# Patient Record
Sex: Female | Born: 1935 | Race: Black or African American | Hispanic: No | Smoking: Never smoker
Health system: Southern US, Community
[De-identification: ages and names within clinical notes are randomized; demographics above are authoritative.]

## PROBLEM LIST (undated history)

## (undated) DIAGNOSIS — K5790 Diverticulosis of intestine, part unspecified, without perforation or abscess without bleeding: Secondary | ICD-10-CM

## (undated) DIAGNOSIS — I2510 Atherosclerotic heart disease of native coronary artery without angina pectoris: Secondary | ICD-10-CM

## (undated) DIAGNOSIS — I509 Heart failure, unspecified: Secondary | ICD-10-CM

## (undated) DIAGNOSIS — K922 Gastrointestinal hemorrhage, unspecified: Secondary | ICD-10-CM

## (undated) DIAGNOSIS — K449 Diaphragmatic hernia without obstruction or gangrene: Secondary | ICD-10-CM

## (undated) DIAGNOSIS — M1990 Unspecified osteoarthritis, unspecified site: Secondary | ICD-10-CM

## (undated) DIAGNOSIS — D649 Anemia, unspecified: Secondary | ICD-10-CM

## (undated) DIAGNOSIS — E785 Hyperlipidemia, unspecified: Secondary | ICD-10-CM

## (undated) DIAGNOSIS — M8080XA Other osteoporosis with current pathological fracture, unspecified site, initial encounter for fracture: Secondary | ICD-10-CM

## (undated) DIAGNOSIS — I10 Essential (primary) hypertension: Secondary | ICD-10-CM

## (undated) HISTORY — DX: Anemia, unspecified: D64.9

## (undated) HISTORY — DX: Other osteoporosis with current pathological fracture, unspecified site, initial encounter for fracture: M80.80XA

## (undated) HISTORY — DX: Gastrointestinal hemorrhage, unspecified: K92.2

## (undated) HISTORY — DX: Atherosclerotic heart disease of native coronary artery without angina pectoris: I25.10

## (undated) HISTORY — PX: TOTAL ABDOMINAL HYSTERECTOMY: SHX209

## (undated) HISTORY — DX: Essential (primary) hypertension: I10

## (undated) HISTORY — DX: Hyperlipidemia, unspecified: E78.5

## (undated) HISTORY — DX: Unspecified osteoarthritis, unspecified site: M19.90

## (undated) HISTORY — PX: FIXATION KYPHOPLASTY: SHX860

## (undated) HISTORY — DX: Diaphragmatic hernia without obstruction or gangrene: K44.9

## (undated) HISTORY — DX: Diverticulosis of intestine, part unspecified, without perforation or abscess without bleeding: K57.90

## (undated) HISTORY — DX: Heart failure, unspecified: I50.9

## (undated) HISTORY — DX: Hypercalcemia: E83.52

---

## 1997-08-30 ENCOUNTER — Other Ambulatory Visit: Admission: RE | Admit: 1997-08-30 | Discharge: 1997-08-30 | Payer: Self-pay | Admitting: Obstetrics & Gynecology

## 1998-07-23 ENCOUNTER — Encounter: Payer: Self-pay | Admitting: Cardiology

## 1998-07-23 ENCOUNTER — Ambulatory Visit (HOSPITAL_COMMUNITY): Admission: RE | Admit: 1998-07-23 | Discharge: 1998-07-23 | Payer: Self-pay | Admitting: Cardiology

## 1998-10-29 ENCOUNTER — Other Ambulatory Visit: Admission: RE | Admit: 1998-10-29 | Discharge: 1998-10-29 | Payer: Self-pay | Admitting: Obstetrics and Gynecology

## 1999-01-10 ENCOUNTER — Encounter: Payer: Self-pay | Admitting: Emergency Medicine

## 1999-01-10 ENCOUNTER — Emergency Department (HOSPITAL_COMMUNITY): Admission: EM | Admit: 1999-01-10 | Discharge: 1999-01-10 | Payer: Self-pay | Admitting: Emergency Medicine

## 2000-03-25 ENCOUNTER — Other Ambulatory Visit: Admission: RE | Admit: 2000-03-25 | Discharge: 2000-03-25 | Payer: Self-pay | Admitting: Obstetrics & Gynecology

## 2000-07-13 ENCOUNTER — Encounter: Payer: Self-pay | Admitting: Cardiology

## 2000-07-13 ENCOUNTER — Ambulatory Visit (HOSPITAL_COMMUNITY): Admission: RE | Admit: 2000-07-13 | Discharge: 2000-07-13 | Payer: Self-pay | Admitting: Cardiology

## 2001-03-25 ENCOUNTER — Other Ambulatory Visit: Admission: RE | Admit: 2001-03-25 | Discharge: 2001-03-25 | Payer: Self-pay | Admitting: Obstetrics and Gynecology

## 2001-08-27 ENCOUNTER — Emergency Department (HOSPITAL_COMMUNITY): Admission: EM | Admit: 2001-08-27 | Discharge: 2001-08-27 | Payer: Self-pay

## 2002-02-01 ENCOUNTER — Ambulatory Visit (HOSPITAL_COMMUNITY): Admission: RE | Admit: 2002-02-01 | Discharge: 2002-02-01 | Payer: Self-pay | Admitting: Cardiology

## 2002-02-01 ENCOUNTER — Encounter: Payer: Self-pay | Admitting: Cardiology

## 2002-05-04 ENCOUNTER — Other Ambulatory Visit: Admission: RE | Admit: 2002-05-04 | Discharge: 2002-05-04 | Payer: Self-pay | Admitting: Obstetrics and Gynecology

## 2004-03-29 ENCOUNTER — Emergency Department (HOSPITAL_COMMUNITY): Admission: EM | Admit: 2004-03-29 | Discharge: 2004-03-29 | Payer: Self-pay | Admitting: *Deleted

## 2005-02-26 ENCOUNTER — Ambulatory Visit (HOSPITAL_COMMUNITY): Admission: RE | Admit: 2005-02-26 | Discharge: 2005-02-26 | Payer: Self-pay | Admitting: Cardiology

## 2005-03-13 ENCOUNTER — Inpatient Hospital Stay (HOSPITAL_BASED_OUTPATIENT_CLINIC_OR_DEPARTMENT_OTHER): Admission: RE | Admit: 2005-03-13 | Discharge: 2005-03-13 | Payer: Self-pay | Admitting: Cardiology

## 2008-07-05 ENCOUNTER — Ambulatory Visit (HOSPITAL_COMMUNITY)
Admission: RE | Admit: 2008-07-05 | Discharge: 2008-07-05 | Payer: Self-pay | Admitting: Physical Medicine and Rehabilitation

## 2008-08-14 ENCOUNTER — Inpatient Hospital Stay (HOSPITAL_COMMUNITY): Admission: EM | Admit: 2008-08-14 | Discharge: 2008-08-23 | Payer: Self-pay | Admitting: *Deleted

## 2008-08-17 ENCOUNTER — Encounter (INDEPENDENT_AMBULATORY_CARE_PROVIDER_SITE_OTHER): Payer: Self-pay | Admitting: Interventional Radiology

## 2008-08-17 ENCOUNTER — Encounter: Payer: Self-pay | Admitting: Interventional Radiology

## 2008-08-24 ENCOUNTER — Ambulatory Visit: Payer: Self-pay | Admitting: Oncology

## 2008-09-14 ENCOUNTER — Encounter: Payer: Self-pay | Admitting: Interventional Radiology

## 2008-09-14 LAB — COMPREHENSIVE METABOLIC PANEL
Albumin: 3.5 g/dL (ref 3.5–5.2)
Alkaline Phosphatase: 90 U/L (ref 39–117)
CO2: 34 mEq/L — ABNORMAL HIGH (ref 19–32)
Calcium: 14.8 mg/dL (ref 8.4–10.5)
Chloride: 100 mEq/L (ref 96–112)
Glucose, Bld: 144 mg/dL — ABNORMAL HIGH (ref 70–99)
Potassium: 3.4 mEq/L — ABNORMAL LOW (ref 3.5–5.3)
Sodium: 140 mEq/L (ref 135–145)
Total Protein: 6.3 g/dL (ref 6.0–8.3)

## 2008-09-14 LAB — CBC WITH DIFFERENTIAL/PLATELET
BASO%: 0.1 % (ref 0.0–2.0)
Basophils Absolute: 0 10*3/uL (ref 0.0–0.1)
Eosinophils Absolute: 0.2 10*3/uL (ref 0.0–0.5)
HCT: 34.3 % — ABNORMAL LOW (ref 34.8–46.6)
HGB: 11.6 g/dL (ref 11.6–15.9)
LYMPH%: 11.6 % — ABNORMAL LOW (ref 14.0–49.7)
MONO#: 1.1 10*3/uL — ABNORMAL HIGH (ref 0.1–0.9)
NEUT#: 8.2 10*3/uL — ABNORMAL HIGH (ref 1.5–6.5)
NEUT%: 76.4 % (ref 38.4–76.8)
Platelets: 253 10*3/uL (ref 145–400)
WBC: 10.8 10*3/uL — ABNORMAL HIGH (ref 3.9–10.3)
lymph#: 1.2 10*3/uL (ref 0.9–3.3)

## 2008-09-16 ENCOUNTER — Inpatient Hospital Stay (HOSPITAL_COMMUNITY): Admission: EM | Admit: 2008-09-16 | Discharge: 2008-09-28 | Payer: Self-pay | Admitting: Emergency Medicine

## 2008-09-16 ENCOUNTER — Ambulatory Visit: Payer: Self-pay | Admitting: Hematology & Oncology

## 2008-09-18 ENCOUNTER — Ambulatory Visit: Payer: Self-pay | Admitting: Oncology

## 2008-09-19 LAB — BETA 2 MICROGLOBULIN, SERUM: Beta-2 Microglobulin: 3.39 mg/L — ABNORMAL HIGH (ref 1.01–1.73)

## 2008-09-19 LAB — KAPPA/LAMBDA LIGHT CHAINS: Kappa:Lambda Ratio: 17.11 — ABNORMAL HIGH (ref 0.26–1.65)

## 2008-09-19 LAB — IMMUNOFIXATION ELECTROPHORESIS: Total Protein, Serum Electrophoresis: 6.5 g/dL (ref 6.0–8.3)

## 2008-09-20 ENCOUNTER — Encounter (HOSPITAL_COMMUNITY): Payer: Self-pay | Admitting: Oncology

## 2008-10-03 ENCOUNTER — Ambulatory Visit: Payer: Self-pay | Admitting: Oncology

## 2008-10-05 LAB — CBC WITH DIFFERENTIAL/PLATELET
BASO%: 0.2 % (ref 0.0–2.0)
EOS%: 1.7 % (ref 0.0–7.0)
HGB: 10.7 g/dL — ABNORMAL LOW (ref 11.6–15.9)
MCH: 32 pg (ref 25.1–34.0)
MCHC: 33.7 g/dL (ref 31.5–36.0)
RDW: 16.9 % — ABNORMAL HIGH (ref 11.2–14.5)
WBC: 11.1 10*3/uL — ABNORMAL HIGH (ref 3.9–10.3)
lymph#: 1.3 10*3/uL (ref 0.9–3.3)

## 2008-10-05 LAB — COMPREHENSIVE METABOLIC PANEL
ALT: 13 U/L (ref 0–35)
AST: 14 U/L (ref 0–37)
Albumin: 3.2 g/dL — ABNORMAL LOW (ref 3.5–5.2)
Calcium: 8.6 mg/dL (ref 8.4–10.5)
Chloride: 111 mEq/L (ref 96–112)
Potassium: 4.2 mEq/L (ref 3.5–5.3)
Total Protein: 5.5 g/dL — ABNORMAL LOW (ref 6.0–8.3)

## 2008-10-10 ENCOUNTER — Encounter: Admission: RE | Admit: 2008-10-10 | Discharge: 2008-10-10 | Payer: Self-pay | Admitting: Cardiology

## 2008-10-13 LAB — CBC WITH DIFFERENTIAL/PLATELET
EOS%: 4.7 % (ref 0.0–7.0)
Eosinophils Absolute: 0.4 10*3/uL (ref 0.0–0.5)
LYMPH%: 14.6 % (ref 14.0–49.7)
MCH: 32 pg (ref 25.1–34.0)
MCV: 95.9 fL (ref 79.5–101.0)
MONO%: 13.5 % (ref 0.0–14.0)
NEUT#: 5.4 10*3/uL (ref 1.5–6.5)
Platelets: 242 10*3/uL (ref 145–400)
RBC: 3.22 10*6/uL — ABNORMAL LOW (ref 3.70–5.45)
RDW: 16.9 % — ABNORMAL HIGH (ref 11.2–14.5)

## 2008-10-20 LAB — CBC WITH DIFFERENTIAL/PLATELET
BASO%: 0.1 % (ref 0.0–2.0)
LYMPH%: 12.1 % — ABNORMAL LOW (ref 14.0–49.7)
MCHC: 34 g/dL (ref 31.5–36.0)
MCV: 92 fL (ref 79.5–101.0)
MONO#: 1 10*3/uL — ABNORMAL HIGH (ref 0.1–0.9)
MONO%: 10.7 % (ref 0.0–14.0)
Platelets: 257 10*3/uL (ref 145–400)
RBC: 3.39 10*6/uL — ABNORMAL LOW (ref 3.70–5.45)
RDW: 16.1 % — ABNORMAL HIGH (ref 11.2–14.5)
WBC: 9.6 10*3/uL (ref 3.9–10.3)

## 2008-10-20 LAB — PROTEIN / CREATININE RATIO, URINE: Protein Creatinine Ratio: 1.82 — ABNORMAL HIGH (ref <0.15)

## 2008-10-23 LAB — COMPREHENSIVE METABOLIC PANEL
ALT: 8 U/L (ref 0–35)
CO2: 24 mEq/L (ref 19–32)
Calcium: 8.3 mg/dL — ABNORMAL LOW (ref 8.4–10.5)
Chloride: 106 mEq/L (ref 96–112)
Sodium: 142 mEq/L (ref 135–145)
Total Protein: 5.9 g/dL — ABNORMAL LOW (ref 6.0–8.3)

## 2008-10-23 LAB — LACTATE DEHYDROGENASE: LDH: 231 U/L (ref 94–250)

## 2008-10-27 LAB — CBC WITH DIFFERENTIAL/PLATELET
BASO%: 0.1 % (ref 0.0–2.0)
Basophils Absolute: 0 10*3/uL (ref 0.0–0.1)
HCT: 29.8 % — ABNORMAL LOW (ref 34.8–46.6)
HGB: 9.7 g/dL — ABNORMAL LOW (ref 11.6–15.9)
LYMPH%: 12.4 % — ABNORMAL LOW (ref 14.0–49.7)
MCH: 30.5 pg (ref 25.1–34.0)
MCHC: 32.6 g/dL (ref 31.5–36.0)
MONO#: 1.4 10*3/uL — ABNORMAL HIGH (ref 0.1–0.9)
NEUT%: 70.2 % (ref 38.4–76.8)
Platelets: 184 10*3/uL (ref 145–400)
WBC: 10.3 10*3/uL (ref 3.9–10.3)

## 2008-11-03 LAB — CBC WITH DIFFERENTIAL/PLATELET
BASO%: 0.1 % (ref 0.0–2.0)
EOS%: 8.2 % — ABNORMAL HIGH (ref 0.0–7.0)
HCT: 29.4 % — ABNORMAL LOW (ref 34.8–46.6)
LYMPH%: 12.4 % — ABNORMAL LOW (ref 14.0–49.7)
MCH: 32.7 pg (ref 25.1–34.0)
MCHC: 34.1 g/dL (ref 31.5–36.0)
MCV: 95.9 fL (ref 79.5–101.0)
MONO%: 13.7 % (ref 0.0–14.0)
NEUT%: 65.6 % (ref 38.4–76.8)
lymph#: 0.9 10*3/uL (ref 0.9–3.3)

## 2008-11-08 ENCOUNTER — Ambulatory Visit: Payer: Self-pay | Admitting: Oncology

## 2008-11-10 LAB — CBC WITH DIFFERENTIAL/PLATELET
BASO%: 0.7 % (ref 0.0–2.0)
EOS%: 5.8 % (ref 0.0–7.0)
HCT: 30.8 % — ABNORMAL LOW (ref 34.8–46.6)
LYMPH%: 20.4 % (ref 14.0–49.7)
MCH: 31.2 pg (ref 25.1–34.0)
MCHC: 33.4 g/dL (ref 31.5–36.0)
MCV: 93.3 fL (ref 79.5–101.0)
MONO%: 19.2 % — ABNORMAL HIGH (ref 0.0–14.0)
NEUT%: 53.9 % (ref 38.4–76.8)
Platelets: 243 10*3/uL (ref 145–400)
RBC: 3.3 10*6/uL — ABNORMAL LOW (ref 3.70–5.45)

## 2008-11-14 LAB — COMPREHENSIVE METABOLIC PANEL
BUN: 4 mg/dL — ABNORMAL LOW (ref 6–23)
CO2: 26 mEq/L (ref 19–32)
Glucose, Bld: 133 mg/dL — ABNORMAL HIGH (ref 70–99)
Sodium: 145 mEq/L (ref 135–145)
Total Bilirubin: 0.4 mg/dL (ref 0.3–1.2)
Total Protein: 5.2 g/dL — ABNORMAL LOW (ref 6.0–8.3)

## 2008-11-14 LAB — LACTATE DEHYDROGENASE: LDH: 186 U/L (ref 94–250)

## 2008-11-14 LAB — KAPPA/LAMBDA LIGHT CHAINS
Kappa free light chain: 40.6 mg/dL — ABNORMAL HIGH (ref 0.33–1.94)
Lambda Free Lght Chn: 0.67 mg/dL (ref 0.57–2.63)

## 2008-11-14 LAB — URIC ACID: Uric Acid, Serum: 2.5 mg/dL (ref 2.4–7.0)

## 2008-11-17 LAB — CBC WITH DIFFERENTIAL/PLATELET
BASO%: 1.1 % (ref 0.0–2.0)
EOS%: 6.7 % (ref 0.0–7.0)
HCT: 31.2 % — ABNORMAL LOW (ref 34.8–46.6)
LYMPH%: 15.8 % (ref 14.0–49.7)
MCH: 31 pg (ref 25.1–34.0)
MCHC: 32.7 g/dL (ref 31.5–36.0)
NEUT%: 57.6 % (ref 38.4–76.8)
Platelets: 226 10*3/uL (ref 145–400)
lymph#: 1.1 10*3/uL (ref 0.9–3.3)

## 2008-11-24 LAB — CBC WITH DIFFERENTIAL/PLATELET
Basophils Absolute: 0 10*3/uL (ref 0.0–0.1)
Eosinophils Absolute: 0.5 10*3/uL (ref 0.0–0.5)
LYMPH%: 15.1 % (ref 14.0–49.7)
MCH: 30.7 pg (ref 25.1–34.0)
MCHC: 32.5 g/dL (ref 31.5–36.0)
MONO#: 1.1 10*3/uL — ABNORMAL HIGH (ref 0.1–0.9)
MONO%: 13.4 % (ref 0.0–14.0)
NEUT%: 65.1 % (ref 38.4–76.8)
RDW: 16.4 % — ABNORMAL HIGH (ref 11.2–14.5)
nRBC: 0 % (ref 0–0)

## 2008-11-30 ENCOUNTER — Ambulatory Visit: Payer: Self-pay | Admitting: Oncology

## 2008-11-30 LAB — PROTEIN / CREATININE RATIO, URINE: Protein Creatinine Ratio: 0.2 — ABNORMAL HIGH (ref <0.15)

## 2008-11-30 LAB — CBC WITH DIFFERENTIAL/PLATELET
BASO%: 0.2 % (ref 0.0–2.0)
EOS%: 0.2 % (ref 0.0–7.0)
HCT: 36.1 % (ref 34.8–46.6)
LYMPH%: 15.6 % (ref 14.0–49.7)
MCH: 30.7 pg (ref 25.1–34.0)
MCHC: 32.7 g/dL (ref 31.5–36.0)
MONO#: 0.5 10*3/uL (ref 0.1–0.9)
NEUT%: 78.1 % — ABNORMAL HIGH (ref 38.4–76.8)
Platelets: 225 10*3/uL (ref 145–400)
RBC: 3.84 10*6/uL (ref 3.70–5.45)
WBC: 8 10*3/uL (ref 3.9–10.3)
nRBC: 0 % (ref 0–0)

## 2008-11-30 LAB — COMPREHENSIVE METABOLIC PANEL
ALT: 13 U/L (ref 0–35)
BUN: 8 mg/dL (ref 6–23)
CO2: 19 mEq/L (ref 19–32)
Calcium: 8.6 mg/dL (ref 8.4–10.5)
Chloride: 108 mEq/L (ref 96–112)
Creatinine, Ser: 0.69 mg/dL (ref 0.40–1.20)
Glucose, Bld: 114 mg/dL — ABNORMAL HIGH (ref 70–99)
Total Bilirubin: 0.5 mg/dL (ref 0.3–1.2)

## 2008-11-30 LAB — URIC ACID: Uric Acid, Serum: 1.9 mg/dL — ABNORMAL LOW (ref 2.4–7.0)

## 2008-11-30 LAB — LACTATE DEHYDROGENASE: LDH: 168 U/L (ref 94–250)

## 2008-12-08 LAB — CBC WITH DIFFERENTIAL/PLATELET
BASO%: 0.6 % (ref 0.0–2.0)
Basophils Absolute: 0.1 10*3/uL (ref 0.0–0.1)
EOS%: 10.3 % — ABNORMAL HIGH (ref 0.0–7.0)
HCT: 34.1 % — ABNORMAL LOW (ref 34.8–46.6)
HGB: 11.1 g/dL — ABNORMAL LOW (ref 11.6–15.9)
LYMPH%: 23.9 % (ref 14.0–49.7)
MCH: 30.8 pg (ref 25.1–34.0)
MCHC: 32.6 g/dL (ref 31.5–36.0)
MCV: 94.7 fL (ref 79.5–101.0)
MONO%: 24.6 % — ABNORMAL HIGH (ref 0.0–14.0)
NEUT%: 40.6 % (ref 38.4–76.8)
Platelets: 211 10*3/uL (ref 145–400)
lymph#: 1.9 10*3/uL (ref 0.9–3.3)

## 2008-12-15 LAB — CBC WITH DIFFERENTIAL/PLATELET
BASO%: 1.4 % (ref 0.0–2.0)
Eosinophils Absolute: 0.4 10*3/uL (ref 0.0–0.5)
HCT: 35.3 % (ref 34.8–46.6)
HGB: 11.6 g/dL (ref 11.6–15.9)
MCHC: 32.9 g/dL (ref 31.5–36.0)
MONO#: 1.3 10*3/uL — ABNORMAL HIGH (ref 0.1–0.9)
NEUT#: 3.3 10*3/uL (ref 1.5–6.5)
NEUT%: 52.9 % (ref 38.4–76.8)
WBC: 6.2 10*3/uL (ref 3.9–10.3)
lymph#: 1.2 10*3/uL (ref 0.9–3.3)

## 2008-12-22 LAB — CBC WITH DIFFERENTIAL/PLATELET
Basophils Absolute: 0 10*3/uL (ref 0.0–0.1)
EOS%: 4.1 % (ref 0.0–7.0)
HCT: 34.7 % — ABNORMAL LOW (ref 34.8–46.6)
HGB: 11.6 g/dL (ref 11.6–15.9)
MCH: 31.3 pg (ref 25.1–34.0)
MONO#: 1.7 10*3/uL — ABNORMAL HIGH (ref 0.1–0.9)
NEUT%: 64.6 % (ref 38.4–76.8)
lymph#: 1.8 10*3/uL (ref 0.9–3.3)

## 2008-12-26 LAB — PROTEIN / CREATININE RATIO, URINE
Creatinine, Urine: 62.9 mg/dL
Total Protein, Urine: 8 mg/dL

## 2008-12-26 LAB — COMPREHENSIVE METABOLIC PANEL
Albumin: 3.1 g/dL — ABNORMAL LOW (ref 3.5–5.2)
Alkaline Phosphatase: 89 U/L (ref 39–117)
BUN: 7 mg/dL (ref 6–23)
Glucose, Bld: 94 mg/dL (ref 70–99)
Total Bilirubin: 0.4 mg/dL (ref 0.3–1.2)

## 2008-12-26 LAB — CBC WITH DIFFERENTIAL/PLATELET
Basophils Absolute: 0 10*3/uL (ref 0.0–0.1)
Eosinophils Absolute: 0.5 10*3/uL (ref 0.0–0.5)
HGB: 11.8 g/dL (ref 11.6–15.9)
LYMPH%: 20.7 % (ref 14.0–49.7)
MCV: 96.2 fL (ref 79.5–101.0)
MONO%: 17.9 % — ABNORMAL HIGH (ref 0.0–14.0)
NEUT#: 3.5 10*3/uL (ref 1.5–6.5)
Platelets: 185 10*3/uL (ref 145–400)

## 2008-12-26 LAB — URIC ACID: Uric Acid, Serum: 2.3 mg/dL — ABNORMAL LOW (ref 2.4–7.0)

## 2008-12-26 LAB — LACTATE DEHYDROGENASE: LDH: 167 U/L (ref 94–250)

## 2009-01-02 ENCOUNTER — Ambulatory Visit: Payer: Self-pay | Admitting: Oncology

## 2009-01-05 LAB — CBC WITH DIFFERENTIAL/PLATELET
EOS%: 8.5 % — ABNORMAL HIGH (ref 0.0–7.0)
Eosinophils Absolute: 0.6 10*3/uL — ABNORMAL HIGH (ref 0.0–0.5)
LYMPH%: 21.8 % (ref 14.0–49.7)
MCH: 30.9 pg (ref 25.1–34.0)
MCHC: 33 g/dL (ref 31.5–36.0)
MCV: 93.8 fL (ref 79.5–101.0)
MONO%: 17 % — ABNORMAL HIGH (ref 0.0–14.0)
Platelets: 224 10*3/uL (ref 145–400)
RBC: 3.85 10*6/uL (ref 3.70–5.45)
RDW: 15.3 % — ABNORMAL HIGH (ref 11.2–14.5)

## 2009-01-09 LAB — UIFE/LIGHT CHAINS/TP QN, 24-HR UR
Free Kappa Lt Chains,Ur: 11.6 mg/dL — ABNORMAL HIGH (ref 0.04–1.51)
Free Kappa/Lambda Ratio: 105.45 ratio — ABNORMAL HIGH (ref 0.46–4.00)
Free Lambda Excretion/Day: 2.64 mg/d
Free Lt Chn Excr Rate: 278.4 mg/d
Time: 24 hours
Total Protein, Urine-Ur/day: 293 mg/d — ABNORMAL HIGH (ref 10–140)
Total Protein, Urine: 12.2 mg/dL

## 2009-01-09 LAB — CREATININE CLEARANCE, URINE, 24 HOUR
Creatinine, 24H Ur: 650 mg/d — ABNORMAL LOW (ref 700–1800)
Creatinine: 1.2 mg/dL (ref 0.40–1.20)
Urine Total Volume-CRCL: 2400 mL

## 2009-01-12 LAB — CBC WITH DIFFERENTIAL/PLATELET
EOS%: 5.5 % (ref 0.0–7.0)
LYMPH%: 24.1 % (ref 14.0–49.7)
MCH: 30.7 pg (ref 25.1–34.0)
MCHC: 33.1 g/dL (ref 31.5–36.0)
MCV: 92.7 fL (ref 79.5–101.0)
MONO%: 20.6 % — ABNORMAL HIGH (ref 0.0–14.0)
Platelets: 211 10*3/uL (ref 145–400)
RBC: 4.11 10*6/uL (ref 3.70–5.45)
RDW: 15 % — ABNORMAL HIGH (ref 11.2–14.5)

## 2009-01-19 LAB — CBC WITH DIFFERENTIAL/PLATELET
BASO%: 0.3 % (ref 0.0–2.0)
Eosinophils Absolute: 0.6 10*3/uL — ABNORMAL HIGH (ref 0.0–0.5)
LYMPH%: 12.6 % — ABNORMAL LOW (ref 14.0–49.7)
MCHC: 33.5 g/dL (ref 31.5–36.0)
MONO#: 1.1 10*3/uL — ABNORMAL HIGH (ref 0.1–0.9)
MONO%: 10.4 % (ref 0.0–14.0)
NEUT#: 7.6 10*3/uL — ABNORMAL HIGH (ref 1.5–6.5)
Platelets: 223 10*3/uL (ref 145–400)
RBC: 4.05 10*6/uL (ref 3.70–5.45)
RDW: 15.8 % — ABNORMAL HIGH (ref 11.2–14.5)
WBC: 10.7 10*3/uL — ABNORMAL HIGH (ref 3.9–10.3)
nRBC: 0 % (ref 0–0)

## 2009-01-26 LAB — PROTEIN / CREATININE RATIO, URINE
Creatinine, Urine: 76.9 mg/dL
Protein Creatinine Ratio: 0.1 (ref <0.15)

## 2009-01-26 LAB — CBC WITH DIFFERENTIAL/PLATELET
Basophils Absolute: 0.1 10*3/uL (ref 0.0–0.1)
HCT: 35.9 % (ref 34.8–46.6)
MCH: 30.9 pg (ref 25.1–34.0)
MCHC: 33.1 g/dL (ref 31.5–36.0)
MONO#: 1.6 10*3/uL — ABNORMAL HIGH (ref 0.1–0.9)
MONO%: 19.9 % — ABNORMAL HIGH (ref 0.0–14.0)
Platelets: 173 10*3/uL (ref 145–400)
RBC: 3.85 10*6/uL (ref 3.70–5.45)
WBC: 8.1 10*3/uL (ref 3.9–10.3)

## 2009-01-26 LAB — COMPREHENSIVE METABOLIC PANEL
Albumin: 3.4 g/dL — ABNORMAL LOW (ref 3.5–5.2)
CO2: 24 mEq/L (ref 19–32)
Chloride: 109 mEq/L (ref 96–112)
Glucose, Bld: 77 mg/dL (ref 70–99)
Potassium: 3.8 mEq/L (ref 3.5–5.3)
Sodium: 141 mEq/L (ref 135–145)
Total Protein: 5.4 g/dL — ABNORMAL LOW (ref 6.0–8.3)

## 2009-01-26 LAB — LACTATE DEHYDROGENASE: LDH: 193 U/L (ref 94–250)

## 2009-01-31 ENCOUNTER — Ambulatory Visit: Payer: Self-pay | Admitting: Oncology

## 2009-02-02 LAB — CBC WITH DIFFERENTIAL/PLATELET
Eosinophils Absolute: 0.9 10*3/uL — ABNORMAL HIGH (ref 0.0–0.5)
HGB: 12 g/dL (ref 11.6–15.9)
MONO#: 1.5 10*3/uL — ABNORMAL HIGH (ref 0.1–0.9)
MONO%: 18.7 % — ABNORMAL HIGH (ref 0.0–14.0)
NEUT#: 3.9 10*3/uL (ref 1.5–6.5)
RBC: 3.87 10*6/uL (ref 3.70–5.45)
RDW: 16.1 % — ABNORMAL HIGH (ref 11.2–14.5)
WBC: 8 10*3/uL (ref 3.9–10.3)
lymph#: 1.6 10*3/uL (ref 0.9–3.3)
nRBC: 0 % (ref 0–0)

## 2009-02-09 LAB — CBC WITH DIFFERENTIAL/PLATELET
BASO%: 0.8 % (ref 0.0–2.0)
Basophils Absolute: 0.1 10*3/uL (ref 0.0–0.1)
EOS%: 7.4 % — ABNORMAL HIGH (ref 0.0–7.0)
HCT: 37.4 % (ref 34.8–46.6)
HGB: 12.4 g/dL (ref 11.6–15.9)
LYMPH%: 25 % (ref 14.0–49.7)
MCH: 31 pg (ref 25.1–34.0)
MCHC: 33.2 g/dL (ref 31.5–36.0)
MONO#: 1.7 10*3/uL — ABNORMAL HIGH (ref 0.1–0.9)
NEUT%: 40.9 % (ref 38.4–76.8)
Platelets: 195 10*3/uL (ref 145–400)

## 2009-02-16 LAB — CBC WITH DIFFERENTIAL/PLATELET
Basophils Absolute: 0 10*3/uL (ref 0.0–0.1)
Eosinophils Absolute: 0.3 10*3/uL (ref 0.0–0.5)
HCT: 37.2 % (ref 34.8–46.6)
LYMPH%: 20.7 % (ref 14.0–49.7)
MCV: 95.6 fL (ref 79.5–101.0)
MONO#: 0.6 10*3/uL (ref 0.1–0.9)
MONO%: 9.5 % (ref 0.0–14.0)
NEUT#: 3.8 10*3/uL (ref 1.5–6.5)
NEUT%: 64.1 % (ref 38.4–76.8)
Platelets: 189 10*3/uL (ref 145–400)
RBC: 3.88 10*6/uL (ref 3.70–5.45)

## 2009-02-23 LAB — CBC WITH DIFFERENTIAL/PLATELET
BASO%: 0.2 % (ref 0.0–2.0)
EOS%: 4.1 % (ref 0.0–7.0)
HCT: 40.4 % (ref 34.8–46.6)
LYMPH%: 16.3 % (ref 14.0–49.7)
MCH: 31.1 pg (ref 25.1–34.0)
MCHC: 33.7 g/dL (ref 31.5–36.0)
MCV: 92.4 fL (ref 79.5–101.0)
MONO%: 23.8 % — ABNORMAL HIGH (ref 0.0–14.0)
NEUT%: 55.6 % (ref 38.4–76.8)
Platelets: 205 10*3/uL (ref 145–400)
RBC: 4.37 10*6/uL (ref 3.70–5.45)
WBC: 10.7 10*3/uL — ABNORMAL HIGH (ref 3.9–10.3)
nRBC: 0 % (ref 0–0)

## 2009-02-28 ENCOUNTER — Ambulatory Visit: Payer: Self-pay | Admitting: Oncology

## 2009-03-02 LAB — COMPREHENSIVE METABOLIC PANEL
Alkaline Phosphatase: 61 U/L (ref 39–117)
BUN: 6 mg/dL (ref 6–23)
Glucose, Bld: 88 mg/dL (ref 70–99)
Total Bilirubin: 0.7 mg/dL (ref 0.3–1.2)

## 2009-03-02 LAB — CBC WITH DIFFERENTIAL/PLATELET
BASO%: 0.4 % (ref 0.0–2.0)
Basophils Absolute: 0 10*3/uL (ref 0.0–0.1)
EOS%: 7.1 % — ABNORMAL HIGH (ref 0.0–7.0)
HGB: 12.4 g/dL (ref 11.6–15.9)
MCH: 30.8 pg (ref 25.1–34.0)
MCHC: 32.8 g/dL (ref 31.5–36.0)
MCV: 93.8 fL (ref 79.5–101.0)
MONO%: 20.7 % — ABNORMAL HIGH (ref 0.0–14.0)
RDW: 16.1 % — ABNORMAL HIGH (ref 11.2–14.5)
lymph#: 1.5 10*3/uL (ref 0.9–3.3)

## 2009-03-02 LAB — PROTEIN / CREATININE RATIO, URINE
Creatinine, Urine: 129.5 mg/dL
Total Protein, Urine: 28 mg/dL

## 2009-03-09 LAB — CBC WITH DIFFERENTIAL/PLATELET
Eosinophils Absolute: 0.1 10*3/uL (ref 0.0–0.5)
HCT: 37.8 % (ref 34.8–46.6)
LYMPH%: 8.8 % — ABNORMAL LOW (ref 14.0–49.7)
MONO#: 1.1 10*3/uL — ABNORMAL HIGH (ref 0.1–0.9)
NEUT#: 8.9 10*3/uL — ABNORMAL HIGH (ref 1.5–6.5)
Platelets: 228 10*3/uL (ref 145–400)
RBC: 4.04 10*6/uL (ref 3.70–5.45)
WBC: 11.1 10*3/uL — ABNORMAL HIGH (ref 3.9–10.3)

## 2009-03-09 LAB — COMPREHENSIVE METABOLIC PANEL
Albumin: 3.7 g/dL (ref 3.5–5.2)
CO2: 22 mEq/L (ref 19–32)
Calcium: 8.5 mg/dL (ref 8.4–10.5)
Glucose, Bld: 116 mg/dL — ABNORMAL HIGH (ref 70–99)
Sodium: 138 mEq/L (ref 135–145)
Total Bilirubin: 0.3 mg/dL (ref 0.3–1.2)
Total Protein: 5.4 g/dL — ABNORMAL LOW (ref 6.0–8.3)

## 2009-03-09 LAB — LACTATE DEHYDROGENASE: LDH: 168 U/L (ref 94–250)

## 2009-03-16 LAB — CBC WITH DIFFERENTIAL/PLATELET
Basophils Absolute: 0 10*3/uL (ref 0.0–0.1)
Eosinophils Absolute: 0.6 10*3/uL — ABNORMAL HIGH (ref 0.0–0.5)
HGB: 12.3 g/dL (ref 11.6–15.9)
LYMPH%: 17.8 % (ref 14.0–49.7)
MCV: 95 fL (ref 79.5–101.0)
MONO#: 1.6 10*3/uL — ABNORMAL HIGH (ref 0.1–0.9)
MONO%: 16.2 % — ABNORMAL HIGH (ref 0.0–14.0)
NEUT#: 6 10*3/uL (ref 1.5–6.5)
Platelets: 181 10*3/uL (ref 145–400)

## 2009-03-23 LAB — CBC WITH DIFFERENTIAL/PLATELET
Eosinophils Absolute: 0.3 10*3/uL (ref 0.0–0.5)
HCT: 38.5 % (ref 34.8–46.6)
LYMPH%: 15.2 % (ref 14.0–49.7)
MCHC: 33 g/dL (ref 31.5–36.0)
MCV: 94.4 fL (ref 79.5–101.0)
MONO%: 22.5 % — ABNORMAL HIGH (ref 0.0–14.0)
NEUT#: 5.6 10*3/uL (ref 1.5–6.5)
NEUT%: 59 % (ref 38.4–76.8)
Platelets: 139 10*3/uL — ABNORMAL LOW (ref 145–400)
RBC: 4.08 10*6/uL (ref 3.70–5.45)
nRBC: 0 % (ref 0–0)

## 2009-03-27 ENCOUNTER — Ambulatory Visit: Payer: Self-pay | Admitting: Oncology

## 2009-03-29 LAB — CBC WITH DIFFERENTIAL/PLATELET
BASO%: 0.2 % (ref 0.0–2.0)
HCT: 40 % (ref 34.8–46.6)
MCHC: 33.3 g/dL (ref 31.5–36.0)
MONO#: 0.6 10*3/uL (ref 0.1–0.9)
NEUT#: 7.4 10*3/uL — ABNORMAL HIGH (ref 1.5–6.5)
NEUT%: 83.6 % — ABNORMAL HIGH (ref 38.4–76.8)
WBC: 8.9 10*3/uL (ref 3.9–10.3)
lymph#: 0.8 10*3/uL — ABNORMAL LOW (ref 0.9–3.3)
nRBC: 0 % (ref 0–0)

## 2009-03-29 LAB — COMPREHENSIVE METABOLIC PANEL
ALT: 24 U/L (ref 0–35)
AST: 22 U/L (ref 0–37)
Albumin: 3.4 g/dL — ABNORMAL LOW (ref 3.5–5.2)
Calcium: 9 mg/dL (ref 8.4–10.5)
Chloride: 105 mEq/L (ref 96–112)
Potassium: 3.5 mEq/L (ref 3.5–5.3)

## 2009-04-06 LAB — CBC WITH DIFFERENTIAL/PLATELET
EOS%: 4 % (ref 0.0–7.0)
MCH: 31.5 pg (ref 25.1–34.0)
MCV: 96.1 fL (ref 79.5–101.0)
MONO%: 24 % — ABNORMAL HIGH (ref 0.0–14.0)
RBC: 4.09 10*6/uL (ref 3.70–5.45)
RDW: 17.1 % — ABNORMAL HIGH (ref 11.2–14.5)

## 2009-04-06 LAB — TECHNOLOGIST REVIEW

## 2009-04-09 ENCOUNTER — Ambulatory Visit (HOSPITAL_COMMUNITY): Admission: RE | Admit: 2009-04-09 | Discharge: 2009-04-09 | Payer: Self-pay | Admitting: Oncology

## 2009-04-12 LAB — CBC WITH DIFFERENTIAL/PLATELET
Basophils Absolute: 0 10*3/uL (ref 0.0–0.1)
EOS%: 0.1 % (ref 0.0–7.0)
Eosinophils Absolute: 0 10*3/uL (ref 0.0–0.5)
HGB: 13.2 g/dL (ref 11.6–15.9)
MCH: 31.6 pg (ref 25.1–34.0)
NEUT#: 15.6 10*3/uL — ABNORMAL HIGH (ref 1.5–6.5)
RBC: 4.18 10*6/uL (ref 3.70–5.45)
RDW: 17.5 % — ABNORMAL HIGH (ref 11.2–14.5)
lymph#: 0.8 10*3/uL — ABNORMAL LOW (ref 0.9–3.3)

## 2009-04-19 LAB — CBC WITH DIFFERENTIAL/PLATELET
Basophils Absolute: 0 10*3/uL (ref 0.0–0.1)
Eosinophils Absolute: 0.5 10*3/uL (ref 0.0–0.5)
HGB: 12.8 g/dL (ref 11.6–15.9)
MCV: 97.8 fL (ref 79.5–101.0)
MONO#: 1.2 10*3/uL — ABNORMAL HIGH (ref 0.1–0.9)
MONO%: 17.1 % — ABNORMAL HIGH (ref 0.0–14.0)
NEUT#: 4.2 10*3/uL (ref 1.5–6.5)
RBC: 4.09 10*6/uL (ref 3.70–5.45)
RDW: 17.4 % — ABNORMAL HIGH (ref 11.2–14.5)
WBC: 7.3 10*3/uL (ref 3.9–10.3)

## 2009-04-25 LAB — COMPREHENSIVE METABOLIC PANEL
ALT: 43 U/L — ABNORMAL HIGH (ref 0–35)
AST: 32 U/L (ref 0–37)
Alkaline Phosphatase: 69 U/L (ref 39–117)
BUN: 13 mg/dL (ref 6–23)
CO2: 20 mEq/L (ref 19–32)
Chloride: 109 mEq/L (ref 96–112)
Creatinine, Ser: 0.71 mg/dL (ref 0.40–1.20)
Glucose, Bld: 102 mg/dL — ABNORMAL HIGH (ref 70–99)
Potassium: 4.2 mEq/L (ref 3.5–5.3)
Sodium: 142 mEq/L (ref 135–145)
Total Bilirubin: 0.3 mg/dL (ref 0.3–1.2)

## 2009-04-25 LAB — CBC WITH DIFFERENTIAL/PLATELET
Basophils Absolute: 0.1 10*3/uL (ref 0.0–0.1)
EOS%: 8.3 % — ABNORMAL HIGH (ref 0.0–7.0)
HCT: 39.7 % (ref 34.8–46.6)
HGB: 13 g/dL (ref 11.6–15.9)
MCH: 31.7 pg (ref 25.1–34.0)
MCV: 96.8 fL (ref 79.5–101.0)
MONO%: 17.5 % — ABNORMAL HIGH (ref 0.0–14.0)
NEUT%: 60.8 % (ref 38.4–76.8)
lymph#: 1.1 10*3/uL (ref 0.9–3.3)

## 2009-05-03 ENCOUNTER — Other Ambulatory Visit (HOSPITAL_COMMUNITY): Payer: Self-pay | Admitting: Oncology

## 2009-05-03 ENCOUNTER — Ambulatory Visit: Payer: Self-pay | Admitting: Oncology

## 2009-05-03 LAB — CBC WITH DIFFERENTIAL/PLATELET
Eosinophils Absolute: 0.1 10*3/uL (ref 0.0–0.5)
LYMPH%: 7.7 % — ABNORMAL LOW (ref 14.0–49.7)
MCV: 97.6 fL (ref 79.5–101.0)
MONO%: 16.8 % — ABNORMAL HIGH (ref 0.0–14.0)
NEUT#: 8.5 10*3/uL — ABNORMAL HIGH (ref 1.5–6.5)
NEUT%: 74.7 % (ref 38.4–76.8)
Platelets: 246 10*3/uL (ref 145–400)
RBC: 4.23 10*6/uL (ref 3.70–5.45)

## 2009-05-03 LAB — TECHNOLOGIST REVIEW

## 2009-05-10 LAB — CBC WITH DIFFERENTIAL/PLATELET
Basophils Absolute: 0.1 10*3/uL (ref 0.0–0.1)
Eosinophils Absolute: 0.3 10*3/uL (ref 0.0–0.5)
HCT: 42.7 % (ref 34.8–46.6)
HGB: 14 g/dL (ref 11.6–15.9)
LYMPH%: 9.6 % — ABNORMAL LOW (ref 14.0–49.7)
MONO#: 1.6 10*3/uL — ABNORMAL HIGH (ref 0.1–0.9)
NEUT#: 11.3 10*3/uL — ABNORMAL HIGH (ref 1.5–6.5)
NEUT%: 77.5 % — ABNORMAL HIGH (ref 38.4–76.8)
Platelets: 161 10*3/uL (ref 145–400)
WBC: 14.5 10*3/uL — ABNORMAL HIGH (ref 3.9–10.3)

## 2009-05-17 ENCOUNTER — Ambulatory Visit: Payer: Self-pay | Admitting: Oncology

## 2009-05-17 LAB — CBC WITH DIFFERENTIAL/PLATELET
BASO%: 0.5 % (ref 0.0–2.0)
Basophils Absolute: 0 10*3/uL (ref 0.0–0.1)
EOS%: 4.2 % (ref 0.0–7.0)
Eosinophils Absolute: 0.3 10*3/uL (ref 0.0–0.5)
HCT: 42.5 % (ref 34.8–46.6)
HGB: 13.6 g/dL (ref 11.6–15.9)
LYMPH%: 11.2 % — ABNORMAL LOW (ref 14.0–49.7)
MCH: 32.2 pg (ref 25.1–34.0)
MCHC: 32 g/dL (ref 31.5–36.0)
MCV: 100.5 fL (ref 79.5–101.0)
MONO#: 0.9 10*3/uL (ref 0.1–0.9)
MONO%: 11.3 % (ref 0.0–14.0)
NEUT#: 5.8 10*3/uL (ref 1.5–6.5)
NEUT%: 72.8 % (ref 38.4–76.8)
Platelets: 159 10*3/uL (ref 145–400)
RBC: 4.23 10*6/uL (ref 3.70–5.45)
RDW: 17.3 % — ABNORMAL HIGH (ref 11.2–14.5)
WBC: 8 10*3/uL (ref 3.9–10.3)
lymph#: 0.9 10*3/uL (ref 0.9–3.3)
nRBC: 0 % (ref 0–0)

## 2009-05-25 ENCOUNTER — Ambulatory Visit (HOSPITAL_COMMUNITY): Admission: RE | Admit: 2009-05-25 | Discharge: 2009-05-25 | Payer: Self-pay | Admitting: Oncology

## 2009-05-25 LAB — CBC WITH DIFFERENTIAL/PLATELET
BASO%: 0.3 % (ref 0.0–2.0)
Basophils Absolute: 0 10*3/uL (ref 0.0–0.1)
EOS%: 0.5 % (ref 0.0–7.0)
Eosinophils Absolute: 0.1 10*3/uL (ref 0.0–0.5)
HCT: 40.8 % (ref 34.8–46.6)
HGB: 13.4 g/dL (ref 11.6–15.9)
LYMPH%: 8.9 % — ABNORMAL LOW (ref 14.0–49.7)
MCH: 32.6 pg (ref 25.1–34.0)
MCHC: 33 g/dL (ref 31.5–36.0)
MCV: 98.8 fL (ref 79.5–101.0)
MONO#: 2.3 10*3/uL — ABNORMAL HIGH (ref 0.1–0.9)
MONO%: 19.9 % — ABNORMAL HIGH (ref 0.0–14.0)
NEUT#: 8 10*3/uL — ABNORMAL HIGH (ref 1.5–6.5)
NEUT%: 70.4 % (ref 38.4–76.8)
Platelets: 235 10*3/uL (ref 145–400)
RBC: 4.13 10*6/uL (ref 3.70–5.45)
RDW: 18.7 % — ABNORMAL HIGH (ref 11.2–14.5)
WBC: 11.4 10*3/uL — ABNORMAL HIGH (ref 3.9–10.3)
lymph#: 1 10*3/uL (ref 0.9–3.3)

## 2009-05-25 LAB — BRAIN NATRIURETIC PEPTIDE: Brain Natriuretic Peptide: 111.4 pg/mL — ABNORMAL HIGH (ref 0.0–100.0)

## 2009-05-25 LAB — LACTATE DEHYDROGENASE: LDH: 208 U/L (ref 94–250)

## 2009-05-25 LAB — PROTEIN / CREATININE RATIO, URINE
Creatinine, Urine: 151.2 mg/dL
Protein Creatinine Ratio: 0.19 — ABNORMAL HIGH (ref <0.15)
Total Protein, Urine: 28 mg/dL

## 2009-05-25 LAB — COMPREHENSIVE METABOLIC PANEL
AST: 26 U/L (ref 0–37)
Alkaline Phosphatase: 78 U/L (ref 39–117)
BUN: 17 mg/dL (ref 6–23)
Creatinine, Ser: 0.74 mg/dL (ref 0.40–1.20)

## 2009-05-25 LAB — TECHNOLOGIST REVIEW

## 2009-05-31 LAB — CBC WITH DIFFERENTIAL/PLATELET
Basophils Absolute: 0 10*3/uL (ref 0.0–0.1)
EOS%: 0.1 % (ref 0.0–7.0)
HCT: 41.7 % (ref 34.8–46.6)
HGB: 13.8 g/dL (ref 11.6–15.9)
MCH: 32.4 pg (ref 25.1–34.0)
MCV: 97.9 fL (ref 79.5–101.0)
MONO%: 12.6 % (ref 0.0–14.0)
NEUT%: 78.2 % — ABNORMAL HIGH (ref 38.4–76.8)
lymph#: 1 10*3/uL (ref 0.9–3.3)

## 2009-05-31 LAB — BASIC METABOLIC PANEL
CO2: 28 mEq/L (ref 19–32)
Chloride: 106 mEq/L (ref 96–112)
Sodium: 139 mEq/L (ref 135–145)

## 2009-06-07 LAB — CBC WITH DIFFERENTIAL/PLATELET
BASO%: 0.3 % (ref 0.0–2.0)
Basophils Absolute: 0.1 10*3/uL (ref 0.0–0.1)
EOS%: 1.6 % (ref 0.0–7.0)
Eosinophils Absolute: 0.2 10*3/uL (ref 0.0–0.5)
HCT: 43.3 % (ref 34.8–46.6)
HGB: 14.1 g/dL (ref 11.6–15.9)
LYMPH%: 9.9 % — ABNORMAL LOW (ref 14.0–49.7)
MCH: 32.1 pg (ref 25.1–34.0)
MCHC: 32.6 g/dL (ref 31.5–36.0)
MCV: 98.6 fL (ref 79.5–101.0)
MONO#: 1.4 10*3/uL — ABNORMAL HIGH (ref 0.1–0.9)
MONO%: 9.3 % (ref 0.0–14.0)
NEUT#: 11.5 10*3/uL — ABNORMAL HIGH (ref 1.5–6.5)
NEUT%: 78.9 % — ABNORMAL HIGH (ref 38.4–76.8)
Platelets: 163 10*3/uL (ref 145–400)
RBC: 4.39 10*6/uL (ref 3.70–5.45)
RDW: 17.8 % — ABNORMAL HIGH (ref 11.2–14.5)
WBC: 14.5 10*3/uL — ABNORMAL HIGH (ref 3.9–10.3)
lymph#: 1.4 10*3/uL (ref 0.9–3.3)
nRBC: 0 % (ref 0–0)

## 2009-06-07 LAB — TECHNOLOGIST REVIEW

## 2009-06-07 LAB — VITAMIN D 25 HYDROXY (VIT D DEFICIENCY, FRACTURES): Vit D, 25-Hydroxy: 14 ng/mL — ABNORMAL LOW (ref 30–89)

## 2009-06-14 LAB — CBC WITH DIFFERENTIAL/PLATELET
Basophils Absolute: 0.1 10*3/uL (ref 0.0–0.1)
EOS%: 3.8 % (ref 0.0–7.0)
Eosinophils Absolute: 0.5 10*3/uL (ref 0.0–0.5)
HCT: 44.2 % (ref 34.8–46.6)
HGB: 14.5 g/dL (ref 11.6–15.9)
MCH: 32.5 pg (ref 25.1–34.0)
MONO#: 2.2 10*3/uL — ABNORMAL HIGH (ref 0.1–0.9)
NEUT%: 67.2 % (ref 38.4–76.8)
lymph#: 1.3 10*3/uL (ref 0.9–3.3)

## 2009-06-20 ENCOUNTER — Ambulatory Visit: Payer: Self-pay | Admitting: Oncology

## 2009-06-22 LAB — CBC WITH DIFFERENTIAL/PLATELET
Basophils Absolute: 0 10*3/uL (ref 0.0–0.1)
Eosinophils Absolute: 0 10*3/uL (ref 0.0–0.5)
HCT: 40.7 % (ref 34.8–46.6)
HGB: 13.4 g/dL (ref 11.6–15.9)
MCV: 98.3 fL (ref 79.5–101.0)
MONO%: 16.6 % — ABNORMAL HIGH (ref 0.0–14.0)
NEUT#: 6.8 10*3/uL — ABNORMAL HIGH (ref 1.5–6.5)
RDW: 17.2 % — ABNORMAL HIGH (ref 11.2–14.5)
lymph#: 1.2 10*3/uL (ref 0.9–3.3)

## 2009-06-22 LAB — PROTEIN / CREATININE RATIO, URINE
Creatinine, Urine: 143.1 mg/dL
Protein Creatinine Ratio: 0.09 (ref <0.15)

## 2009-06-22 LAB — COMPREHENSIVE METABOLIC PANEL
ALT: 31 U/L (ref 0–35)
Alkaline Phosphatase: 86 U/L (ref 39–117)
Sodium: 140 mEq/L (ref 135–145)
Total Bilirubin: 0.3 mg/dL (ref 0.3–1.2)
Total Protein: 5.4 g/dL — ABNORMAL LOW (ref 6.0–8.3)

## 2009-06-22 LAB — TECHNOLOGIST REVIEW

## 2009-06-29 LAB — CBC WITH DIFFERENTIAL/PLATELET
BASO%: 0.6 % (ref 0.0–2.0)
EOS%: 0.7 % (ref 0.0–7.0)
HCT: 42 % (ref 34.8–46.6)
LYMPH%: 12.8 % — ABNORMAL LOW (ref 14.0–49.7)
MCH: 32.5 pg (ref 25.1–34.0)
MCHC: 32.6 g/dL (ref 31.5–36.0)
NEUT%: 67.9 % (ref 38.4–76.8)
Platelets: 197 10*3/uL (ref 145–400)

## 2009-07-06 LAB — CBC WITH DIFFERENTIAL/PLATELET
Eosinophils Absolute: 0.3 10*3/uL (ref 0.0–0.5)
LYMPH%: 12 % — ABNORMAL LOW (ref 14.0–49.7)
MCHC: 32.9 g/dL (ref 31.5–36.0)
MCV: 100.2 fL (ref 79.5–101.0)
MONO%: 12.1 % (ref 0.0–14.0)
NEUT#: 8.2 10*3/uL — ABNORMAL HIGH (ref 1.5–6.5)
NEUT%: 72.7 % (ref 38.4–76.8)
Platelets: 154 10*3/uL (ref 145–400)
RBC: 4.24 10*6/uL (ref 3.70–5.45)
nRBC: 0 % (ref 0–0)

## 2009-07-13 LAB — CBC WITH DIFFERENTIAL/PLATELET
Basophils Absolute: 0 10*3/uL (ref 0.0–0.1)
EOS%: 1.3 % (ref 0.0–7.0)
HGB: 13.7 g/dL (ref 11.6–15.9)
MCH: 32.5 pg (ref 25.1–34.0)
NEUT#: 9.6 10*3/uL — ABNORMAL HIGH (ref 1.5–6.5)
RDW: 17.4 % — ABNORMAL HIGH (ref 11.2–14.5)
lymph#: 1.5 10*3/uL (ref 0.9–3.3)

## 2009-07-13 LAB — VITAMIN D 25 HYDROXY (VIT D DEFICIENCY, FRACTURES): Vit D, 25-Hydroxy: 21 ng/mL — ABNORMAL LOW (ref 30–89)

## 2009-07-13 LAB — TECHNOLOGIST REVIEW

## 2009-07-20 ENCOUNTER — Ambulatory Visit (HOSPITAL_COMMUNITY): Admission: RE | Admit: 2009-07-20 | Discharge: 2009-07-20 | Payer: Self-pay | Admitting: Oncology

## 2009-07-20 ENCOUNTER — Ambulatory Visit: Payer: Self-pay | Admitting: Oncology

## 2009-07-20 LAB — PROTEIN / CREATININE RATIO, URINE
Creatinine, Urine: 118.2 mg/dL
Protein Creatinine Ratio: 0.25 — ABNORMAL HIGH (ref <0.15)

## 2009-07-20 LAB — CBC WITH DIFFERENTIAL/PLATELET
BASO%: 0.3 % (ref 0.0–2.0)
HCT: 42.1 % (ref 34.8–46.6)
MCHC: 32.8 g/dL (ref 31.5–36.0)
MONO#: 0.9 10*3/uL (ref 0.1–0.9)
RBC: 4.24 10*6/uL (ref 3.70–5.45)
RDW: 16.8 % — ABNORMAL HIGH (ref 11.2–14.5)
WBC: 8.7 10*3/uL (ref 3.9–10.3)
lymph#: 0.9 10*3/uL (ref 0.9–3.3)

## 2009-07-21 LAB — D-DIMER, QUANTITATIVE: D-Dimer, Quant: 0.5 ug/mL-FEU — ABNORMAL HIGH (ref 0.00–0.48)

## 2009-07-23 LAB — COMPREHENSIVE METABOLIC PANEL
AST: 19 U/L (ref 0–37)
Alkaline Phosphatase: 70 U/L (ref 39–117)
BUN: 11 mg/dL (ref 6–23)
Creatinine, Ser: 0.67 mg/dL (ref 0.40–1.20)
Potassium: 3.8 mEq/L (ref 3.5–5.3)

## 2009-07-23 LAB — LACTATE DEHYDROGENASE: LDH: 232 U/L (ref 94–250)

## 2009-07-23 LAB — KAPPA/LAMBDA LIGHT CHAINS: Kappa:Lambda Ratio: 1.11 (ref 0.26–1.65)

## 2009-08-02 LAB — CBC WITH DIFFERENTIAL/PLATELET
EOS%: 0.6 % (ref 0.0–7.0)
MCH: 32.7 pg (ref 25.1–34.0)
MCHC: 32.5 g/dL (ref 31.5–36.0)
MCV: 100.7 fL (ref 79.5–101.0)
MONO%: 2.9 % (ref 0.0–14.0)
RBC: 4.31 10*6/uL (ref 3.70–5.45)
RDW: 16 % — ABNORMAL HIGH (ref 11.2–14.5)
nRBC: 0 % (ref 0–0)

## 2009-08-02 LAB — COMPREHENSIVE METABOLIC PANEL
ALT: 34 U/L (ref 0–35)
CO2: 27 mEq/L (ref 19–32)
Creatinine, Ser: 0.83 mg/dL (ref 0.40–1.20)
Total Bilirubin: 0.6 mg/dL (ref 0.3–1.2)

## 2009-08-02 LAB — LACTATE DEHYDROGENASE: LDH: 220 U/L (ref 94–250)

## 2009-08-03 LAB — BRAIN NATRIURETIC PEPTIDE: Brain Natriuretic Peptide: 49.8 pg/mL (ref 0.0–100.0)

## 2009-08-15 LAB — CBC WITH DIFFERENTIAL/PLATELET
BASO%: 0.1 % (ref 0.0–2.0)
EOS%: 0.2 % (ref 0.0–7.0)
LYMPH%: 8.6 % — ABNORMAL LOW (ref 14.0–49.7)
MCHC: 33.4 g/dL (ref 31.5–36.0)
MONO#: 1.4 10*3/uL — ABNORMAL HIGH (ref 0.1–0.9)
RBC: 4.06 10*6/uL (ref 3.70–5.45)
WBC: 10.2 10*3/uL (ref 3.9–10.3)
lymph#: 0.9 10*3/uL (ref 0.9–3.3)
nRBC: 0 % (ref 0–0)

## 2009-08-15 LAB — COMPREHENSIVE METABOLIC PANEL
ALT: 15 U/L (ref 0–35)
BUN: 14 mg/dL (ref 6–23)
CO2: 20 mEq/L (ref 19–32)
Creatinine, Ser: 0.75 mg/dL (ref 0.40–1.20)
Total Bilirubin: 0.4 mg/dL (ref 0.3–1.2)

## 2009-08-15 LAB — LACTATE DEHYDROGENASE: LDH: 212 U/L (ref 94–250)

## 2009-08-15 LAB — PROTEIN / CREATININE RATIO, URINE: Protein Creatinine Ratio: 0.11 (ref <0.15)

## 2009-08-15 LAB — VITAMIN D 25 HYDROXY (VIT D DEFICIENCY, FRACTURES): Vit D, 25-Hydroxy: 34 ng/mL (ref 30–89)

## 2009-08-17 LAB — UIFE/LIGHT CHAINS/TP QN, 24-HR UR
Alpha 2, Urine: DETECTED — AB
Beta, Urine: DETECTED — AB
Free Kappa Lt Chains,Ur: 4.49 mg/dL — ABNORMAL HIGH (ref 0.04–1.51)
Free Lt Chn Excr Rate: 67.35 mg/d

## 2009-08-17 LAB — CREATININE CLEARANCE, URINE, 24 HOUR
Collection Interval-CRCL: 24 hours
Creatinine Clearance: 87 mL/min (ref 75–115)
Creatinine, 24H Ur: 940 mg/d (ref 700–1800)
Creatinine, Urine: 62.7 mg/dL
Creatinine: 0.75 mg/dL (ref 0.40–1.20)
Urine Total Volume-CRCL: 1500 mL

## 2009-08-27 ENCOUNTER — Ambulatory Visit: Payer: Self-pay | Admitting: Oncology

## 2009-08-29 LAB — CBC WITH DIFFERENTIAL/PLATELET
BASO%: 0.5 % (ref 0.0–2.0)
LYMPH%: 8.3 % — ABNORMAL LOW (ref 14.0–49.7)
MCHC: 32.9 g/dL (ref 31.5–36.0)
MONO#: 0.6 10*3/uL (ref 0.1–0.9)
RBC: 4.18 10*6/uL (ref 3.70–5.45)
WBC: 8.6 10*3/uL (ref 3.9–10.3)
lymph#: 0.7 10*3/uL — ABNORMAL LOW (ref 0.9–3.3)

## 2009-08-29 LAB — TECHNOLOGIST REVIEW

## 2009-09-12 LAB — CBC WITH DIFFERENTIAL/PLATELET
BASO%: 0.3 % (ref 0.0–2.0)
HGB: 13.7 g/dL (ref 11.6–15.9)
LYMPH%: 22.5 % (ref 14.0–49.7)
MCH: 32.4 pg (ref 25.1–34.0)
NEUT#: 3.4 10*3/uL (ref 1.5–6.5)
NEUT%: 51.4 % (ref 38.4–76.8)
RBC: 4.23 10*6/uL (ref 3.70–5.45)
RDW: 15.2 % — ABNORMAL HIGH (ref 11.2–14.5)
lymph#: 1.5 10*3/uL (ref 0.9–3.3)

## 2009-09-26 ENCOUNTER — Ambulatory Visit: Payer: Self-pay | Admitting: Oncology

## 2009-09-27 LAB — CBC WITH DIFFERENTIAL/PLATELET
EOS%: 0.8 % (ref 0.0–7.0)
MCH: 32.6 pg (ref 25.1–34.0)
MCHC: 33.3 g/dL (ref 31.5–36.0)
MCV: 97.9 fL (ref 79.5–101.0)
MONO%: 5.6 % (ref 0.0–14.0)
RBC: 4.32 10*6/uL (ref 3.70–5.45)
RDW: 15.1 % — ABNORMAL HIGH (ref 11.2–14.5)
nRBC: 0 % (ref 0–0)

## 2009-09-27 LAB — COMPREHENSIVE METABOLIC PANEL
AST: 23 U/L (ref 0–37)
BUN: 13 mg/dL (ref 6–23)
CO2: 27 mEq/L (ref 19–32)
Calcium: 9 mg/dL (ref 8.4–10.5)
Chloride: 109 mEq/L (ref 96–112)
Creatinine, Ser: 0.82 mg/dL (ref 0.40–1.20)

## 2009-09-27 LAB — PROTEIN / CREATININE RATIO, URINE
Creatinine, Urine: 195.5 mg/dL
Total Protein, Urine: 22 mg/dL

## 2009-09-27 LAB — LACTATE DEHYDROGENASE: LDH: 184 U/L (ref 94–250)

## 2009-10-23 LAB — CBC WITH DIFFERENTIAL/PLATELET
BASO%: 0.3 % (ref 0.0–2.0)
EOS%: 4.5 % (ref 0.0–7.0)
HCT: 38.7 % (ref 34.8–46.6)
LYMPH%: 21 % (ref 14.0–49.7)
MCH: 33.4 pg (ref 25.1–34.0)
MCHC: 33.5 g/dL (ref 31.5–36.0)
MONO%: 9.5 % (ref 0.0–14.0)
NEUT%: 64.7 % (ref 38.4–76.8)
lymph#: 1.2 10*3/uL (ref 0.9–3.3)

## 2009-10-23 LAB — COMPREHENSIVE METABOLIC PANEL
ALT: 34 U/L (ref 0–35)
AST: 27 U/L (ref 0–37)
Alkaline Phosphatase: 66 U/L (ref 39–117)
Chloride: 108 mEq/L (ref 96–112)
Creatinine, Ser: 0.86 mg/dL (ref 0.40–1.20)
Total Bilirubin: 0.3 mg/dL (ref 0.3–1.2)

## 2009-10-24 LAB — PROTEIN / CREATININE RATIO, URINE
Creatinine, Urine: 141.4 mg/dL
Protein Creatinine Ratio: 0.06 (ref <0.15)
Total Protein, Urine: 9 mg/dL

## 2009-11-16 ENCOUNTER — Ambulatory Visit: Payer: Self-pay | Admitting: Oncology

## 2009-11-20 LAB — CBC WITH DIFFERENTIAL/PLATELET
Basophils Absolute: 0 10*3/uL (ref 0.0–0.1)
Eosinophils Absolute: 0.2 10*3/uL (ref 0.0–0.5)
HCT: 40.2 % (ref 34.8–46.6)
HGB: 13.6 g/dL (ref 11.6–15.9)
LYMPH%: 22.9 % (ref 14.0–49.7)
MCV: 99.6 fL (ref 79.5–101.0)
MONO%: 11.5 % (ref 0.0–14.0)
NEUT#: 3.3 10*3/uL (ref 1.5–6.5)
NEUT%: 61.9 % (ref 38.4–76.8)
Platelets: 183 10*3/uL (ref 145–400)

## 2009-11-21 LAB — COMPREHENSIVE METABOLIC PANEL
Albumin: 3.5 g/dL (ref 3.5–5.2)
Alkaline Phosphatase: 80 U/L (ref 39–117)
BUN: 11 mg/dL (ref 6–23)
Glucose, Bld: 102 mg/dL — ABNORMAL HIGH (ref 70–99)
Potassium: 3.5 mEq/L (ref 3.5–5.3)
Total Bilirubin: 0.3 mg/dL (ref 0.3–1.2)

## 2009-11-21 LAB — URIC ACID: Uric Acid, Serum: 2.9 mg/dL (ref 2.4–7.0)

## 2009-11-21 LAB — PROTEIN / CREATININE RATIO, URINE: Protein Creatinine Ratio: 0.05 (ref <0.15)

## 2009-11-21 LAB — VITAMIN D 25 HYDROXY (VIT D DEFICIENCY, FRACTURES): Vit D, 25-Hydroxy: 51 ng/mL (ref 30–89)

## 2009-12-19 ENCOUNTER — Ambulatory Visit: Payer: Self-pay | Admitting: Oncology

## 2009-12-21 LAB — URIC ACID: Uric Acid, Serum: 1.3 mg/dL — ABNORMAL LOW (ref 2.4–7.0)

## 2009-12-21 LAB — COMPREHENSIVE METABOLIC PANEL
Alkaline Phosphatase: 65 U/L (ref 39–117)
BUN: 16 mg/dL (ref 6–23)
CO2: 23 mEq/L (ref 19–32)
Creatinine, Ser: 0.86 mg/dL (ref 0.40–1.20)
Glucose, Bld: 107 mg/dL — ABNORMAL HIGH (ref 70–99)
Total Bilirubin: 0.3 mg/dL (ref 0.3–1.2)

## 2009-12-21 LAB — CBC WITH DIFFERENTIAL/PLATELET
Basophils Absolute: 0 10*3/uL (ref 0.0–0.1)
Eosinophils Absolute: 0 10*3/uL (ref 0.0–0.5)
HCT: 40.3 % (ref 34.8–46.6)
HGB: 13.7 g/dL (ref 11.6–15.9)
LYMPH%: 8.1 % — ABNORMAL LOW (ref 14.0–49.7)
MCV: 98.3 fL (ref 79.5–101.0)
MONO#: 1.3 10*3/uL — ABNORMAL HIGH (ref 0.1–0.9)
MONO%: 11.1 % (ref 0.0–14.0)
NEUT#: 9.3 10*3/uL — ABNORMAL HIGH (ref 1.5–6.5)
NEUT%: 80.5 % — ABNORMAL HIGH (ref 38.4–76.8)
Platelets: 185 10*3/uL (ref 145–400)
RBC: 4.1 10*6/uL (ref 3.70–5.45)

## 2009-12-21 LAB — LACTATE DEHYDROGENASE: LDH: 150 U/L (ref 94–250)

## 2010-01-22 ENCOUNTER — Ambulatory Visit: Payer: Self-pay | Admitting: Oncology

## 2010-01-24 LAB — CBC WITH DIFFERENTIAL/PLATELET
Basophils Absolute: 0 10*3/uL (ref 0.0–0.1)
EOS%: 0.4 % (ref 0.0–7.0)
Eosinophils Absolute: 0 10*3/uL (ref 0.0–0.5)
HGB: 14.5 g/dL (ref 11.6–15.9)
MCH: 32.5 pg (ref 25.1–34.0)
NEUT#: 6.1 10*3/uL (ref 1.5–6.5)
RDW: 15.3 % — ABNORMAL HIGH (ref 11.2–14.5)
lymph#: 0.8 10*3/uL — ABNORMAL LOW (ref 0.9–3.3)
nRBC: 0 % (ref 0–0)

## 2010-01-25 LAB — PROTEIN / CREATININE RATIO, URINE
Creatinine, Urine: 204.7 mg/dL
Total Protein, Urine: 11 mg/dL

## 2010-01-28 LAB — COMPREHENSIVE METABOLIC PANEL
AST: 42 U/L — ABNORMAL HIGH (ref 0–37)
Albumin: 3.1 g/dL — ABNORMAL LOW (ref 3.5–5.2)
Alkaline Phosphatase: 62 U/L (ref 39–117)
BUN: 5 mg/dL — ABNORMAL LOW (ref 6–23)
Potassium: 3.5 mEq/L (ref 3.5–5.3)
Total Bilirubin: 0.7 mg/dL (ref 0.3–1.2)

## 2010-02-22 ENCOUNTER — Ambulatory Visit: Payer: Self-pay | Admitting: Oncology

## 2010-02-26 LAB — CBC WITH DIFFERENTIAL/PLATELET
Basophils Absolute: 0 10*3/uL (ref 0.0–0.1)
Eosinophils Absolute: 0.3 10*3/uL (ref 0.0–0.5)
HCT: 41.2 % (ref 34.8–46.6)
HGB: 13.7 g/dL (ref 11.6–15.9)
MONO#: 1 10*3/uL — ABNORMAL HIGH (ref 0.1–0.9)
NEUT#: 3.1 10*3/uL (ref 1.5–6.5)
NEUT%: 54 % (ref 38.4–76.8)
RDW: 17 % — ABNORMAL HIGH (ref 11.2–14.5)
WBC: 5.8 10*3/uL (ref 3.9–10.3)
lymph#: 1.3 10*3/uL (ref 0.9–3.3)

## 2010-02-26 LAB — COMPREHENSIVE METABOLIC PANEL
ALT: 35 U/L (ref 0–35)
CO2: 31 mEq/L (ref 19–32)
Calcium: 8.5 mg/dL (ref 8.4–10.5)
Chloride: 108 mEq/L (ref 96–112)
Creatinine, Ser: 0.97 mg/dL (ref 0.40–1.20)
Sodium: 144 mEq/L (ref 135–145)
Total Protein: 5 g/dL — ABNORMAL LOW (ref 6.0–8.3)

## 2010-02-26 LAB — URIC ACID: Uric Acid, Serum: 2.7 mg/dL (ref 2.4–7.0)

## 2010-02-26 LAB — PROTEIN / CREATININE RATIO, URINE: Protein Creatinine Ratio: 0.09 (ref <0.15)

## 2010-04-26 ENCOUNTER — Ambulatory Visit (HOSPITAL_BASED_OUTPATIENT_CLINIC_OR_DEPARTMENT_OTHER): Payer: MEDICARE | Admitting: Oncology

## 2010-04-29 LAB — CBC WITH DIFFERENTIAL/PLATELET
BASO%: 0.6 % (ref 0.0–2.0)
EOS%: 3.6 % (ref 0.0–7.0)
MCH: 32.4 pg (ref 25.1–34.0)
MCHC: 32.9 g/dL (ref 31.5–36.0)
MONO#: 1.5 10*3/uL — ABNORMAL HIGH (ref 0.1–0.9)
NEUT%: 51.3 % (ref 38.4–76.8)
RBC: 4.26 10*6/uL (ref 3.70–5.45)
WBC: 6.9 10*3/uL (ref 3.9–10.3)
lymph#: 1.6 10*3/uL (ref 0.9–3.3)
nRBC: 0 % (ref 0–0)

## 2010-04-29 LAB — PROTEIN / CREATININE RATIO, URINE
Protein Creatinine Ratio: 0.05 (ref <0.15)
Total Protein, Urine: 6 mg/dL

## 2010-04-30 LAB — URIC ACID: Uric Acid, Serum: 3.5 mg/dL (ref 2.4–7.0)

## 2010-04-30 LAB — VITAMIN D 25 HYDROXY (VIT D DEFICIENCY, FRACTURES): Vit D, 25-Hydroxy: 47 ng/mL (ref 30–89)

## 2010-04-30 LAB — LACTATE DEHYDROGENASE: LDH: 245 U/L (ref 94–250)

## 2010-04-30 LAB — COMPREHENSIVE METABOLIC PANEL
AST: 23 U/L (ref 0–37)
BUN: 14 mg/dL (ref 6–23)
CO2: 22 mEq/L (ref 19–32)
Calcium: 8.4 mg/dL (ref 8.4–10.5)
Chloride: 106 mEq/L (ref 96–112)
Creatinine, Ser: 0.73 mg/dL (ref 0.40–1.20)
Glucose, Bld: 94 mg/dL (ref 70–99)

## 2010-06-12 ENCOUNTER — Encounter: Payer: MEDICARE | Admitting: Oncology

## 2010-06-12 DIAGNOSIS — E559 Vitamin D deficiency, unspecified: Secondary | ICD-10-CM

## 2010-06-12 DIAGNOSIS — C9000 Multiple myeloma not having achieved remission: Secondary | ICD-10-CM

## 2010-06-12 DIAGNOSIS — R609 Edema, unspecified: Secondary | ICD-10-CM

## 2010-06-12 DIAGNOSIS — R197 Diarrhea, unspecified: Secondary | ICD-10-CM

## 2010-06-12 LAB — CBC WITH DIFFERENTIAL/PLATELET
BASO%: 0.8 % (ref 0.0–2.0)
EOS%: 4.8 % (ref 0.0–7.0)
MCH: 33.3 pg (ref 25.1–34.0)
MCHC: 33.4 g/dL (ref 31.5–36.0)
RDW: 17.8 % — ABNORMAL HIGH (ref 11.2–14.5)
lymph#: 1.4 10*3/uL (ref 0.9–3.3)

## 2010-07-25 ENCOUNTER — Other Ambulatory Visit (HOSPITAL_COMMUNITY): Payer: Self-pay | Admitting: Oncology

## 2010-07-25 ENCOUNTER — Encounter (HOSPITAL_BASED_OUTPATIENT_CLINIC_OR_DEPARTMENT_OTHER): Payer: MEDICARE | Admitting: Oncology

## 2010-07-25 ENCOUNTER — Ambulatory Visit (HOSPITAL_COMMUNITY)
Admission: RE | Admit: 2010-07-25 | Discharge: 2010-07-25 | Disposition: A | Discharge status: Home / Self Care | Payer: MEDICARE | Source: Ambulatory Visit | Attending: Oncology | Admitting: Oncology

## 2010-07-25 ENCOUNTER — Ambulatory Visit (HOSPITAL_COMMUNITY): Admission: RE | Admit: 2010-07-25 | Payer: MEDICARE | Source: Ambulatory Visit

## 2010-07-25 DIAGNOSIS — C9000 Multiple myeloma not having achieved remission: Secondary | ICD-10-CM

## 2010-07-25 DIAGNOSIS — IMO0002 Reserved for concepts with insufficient information to code with codable children: Secondary | ICD-10-CM

## 2010-07-25 DIAGNOSIS — R197 Diarrhea, unspecified: Secondary | ICD-10-CM

## 2010-07-25 DIAGNOSIS — R52 Pain, unspecified: Secondary | ICD-10-CM

## 2010-07-25 DIAGNOSIS — I517 Cardiomegaly: Secondary | ICD-10-CM | POA: Insufficient documentation

## 2010-07-25 DIAGNOSIS — R609 Edema, unspecified: Secondary | ICD-10-CM

## 2010-07-25 DIAGNOSIS — E559 Vitamin D deficiency, unspecified: Secondary | ICD-10-CM

## 2010-07-25 LAB — CBC WITH DIFFERENTIAL/PLATELET
BASO%: 0.8 % (ref 0.0–2.0)
Basophils Absolute: 0.1 10*3/uL (ref 0.0–0.1)
EOS%: 1.1 % (ref 0.0–7.0)
HCT: 44.6 % (ref 34.8–46.6)
HGB: 14.8 g/dL (ref 11.6–15.9)
MCH: 32.7 pg (ref 25.1–34.0)
MCHC: 33.2 g/dL (ref 31.5–36.0)
MCV: 98.5 fL (ref 79.5–101.0)
MONO%: 6.9 % (ref 0.0–14.0)
NEUT%: 76.7 % (ref 38.4–76.8)
RDW: 16.3 % — ABNORMAL HIGH (ref 11.2–14.5)
lymph#: 0.9 10*3/uL (ref 0.9–3.3)

## 2010-07-25 LAB — COMPREHENSIVE METABOLIC PANEL
Albumin: 3.6 g/dL (ref 3.5–5.2)
CO2: 24 mEq/L (ref 19–32)
Calcium: 9.2 mg/dL (ref 8.4–10.5)
Glucose, Bld: 151 mg/dL — ABNORMAL HIGH (ref 70–99)
Potassium: 4.4 mEq/L (ref 3.5–5.3)
Sodium: 141 mEq/L (ref 135–145)
Total Protein: 5.5 g/dL — ABNORMAL LOW (ref 6.0–8.3)

## 2010-07-25 LAB — URIC ACID: Uric Acid, Serum: 2.6 mg/dL (ref 2.4–7.0)

## 2010-07-26 LAB — VITAMIN D 25 HYDROXY (VIT D DEFICIENCY, FRACTURES): Vit D, 25-Hydroxy: 25 ng/mL — ABNORMAL LOW (ref 30–89)

## 2010-08-20 LAB — BASIC METABOLIC PANEL
BUN: 1 mg/dL — ABNORMAL LOW (ref 6–23)
BUN: 2 mg/dL — ABNORMAL LOW (ref 6–23)
BUN: 2 mg/dL — ABNORMAL LOW (ref 6–23)
BUN: 3 mg/dL — ABNORMAL LOW (ref 6–23)
BUN: 6 mg/dL (ref 6–23)
BUN: 7 mg/dL (ref 6–23)
CO2: 23 mEq/L (ref 19–32)
CO2: 21 mEq/L (ref 19–32)
CO2: 24 mEq/L (ref 19–32)
CO2: 25 mEq/L (ref 19–32)
Calcium: 10.2 mg/dL (ref 8.4–10.5)
Calcium: 8.5 mg/dL (ref 8.4–10.5)
Calcium: 8.7 mg/dL (ref 8.4–10.5)
Calcium: 9.2 mg/dL (ref 8.4–10.5)
Chloride: 112 mEq/L (ref 96–112)
Chloride: 112 mEq/L (ref 96–112)
Chloride: 117 mEq/L — ABNORMAL HIGH (ref 96–112)
Chloride: 117 mEq/L — ABNORMAL HIGH (ref 96–112)
Chloride: 119 mEq/L — ABNORMAL HIGH (ref 96–112)
Chloride: 108 mEq/L (ref 96–112)
Chloride: 111 mEq/L (ref 96–112)
Chloride: 114 mEq/L — ABNORMAL HIGH (ref 96–112)
Creatinine, Ser: 0.96 mg/dL (ref 0.4–1.2)
Creatinine, Ser: 0.92 mg/dL (ref 0.4–1.2)
Creatinine, Ser: 0.95 mg/dL (ref 0.4–1.2)
Creatinine, Ser: 0.97 mg/dL (ref 0.4–1.2)
Creatinine, Ser: 1.04 mg/dL (ref 0.4–1.2)
Creatinine, Ser: 1.04 mg/dL (ref 0.4–1.2)
GFR calc Af Amer: >60 mL/min (ref >60)
GFR calc Af Amer: >60 mL/min (ref >60)
GFR calc Af Amer: >60 mL/min (ref >60)
GFR calc Af Amer: >60 mL/min (ref >60)
GFR calc Af Amer: >60 mL/min (ref >60)
GFR calc non Af Amer: 49 mL/min — ABNORMAL LOW (ref >60)
GFR calc non Af Amer: 59 mL/min — ABNORMAL LOW (ref >60)
GFR calc non Af Amer: 51 mL/min — ABNORMAL LOW (ref >60)
GFR calc non Af Amer: 56 mL/min — ABNORMAL LOW (ref >60)
GFR calc non Af Amer: 56 mL/min — ABNORMAL LOW (ref >60)
Glucose, Bld: 107 mg/dL — ABNORMAL HIGH (ref 70–99)
Glucose, Bld: 126 mg/dL — ABNORMAL HIGH (ref 70–99)
Glucose, Bld: 129 mg/dL — ABNORMAL HIGH (ref 70–99)
Glucose, Bld: 138 mg/dL — ABNORMAL HIGH (ref 70–99)
Glucose, Bld: 120 mg/dL — ABNORMAL HIGH (ref 70–99)
Glucose, Bld: 132 mg/dL — ABNORMAL HIGH (ref 70–99)
Potassium: 2.4 mEq/L — CL (ref 3.5–5.1)
Potassium: 2.5 mEq/L — CL (ref 3.5–5.1)
Potassium: 3.2 mEq/L — ABNORMAL LOW (ref 3.5–5.1)
Potassium: 4 mEq/L (ref 3.5–5.1)
Potassium: 4 mEq/L (ref 3.5–5.1)
Potassium: 2.8 mEq/L — ABNORMAL LOW (ref 3.5–5.1)
Sodium: 141 mEq/L (ref 135–145)
Sodium: 144 mEq/L (ref 135–145)
Sodium: 144 mEq/L (ref 135–145)
Sodium: 138 mEq/L (ref 135–145)
Sodium: 141 mEq/L (ref 135–145)

## 2010-08-20 LAB — COMPREHENSIVE METABOLIC PANEL
ALT: 16 U/L (ref 0–35)
ALT: 29 U/L (ref 0–35)
AST: 27 U/L (ref 0–37)
AST: 37 U/L (ref 0–37)
Albumin: 2.5 g/dL — ABNORMAL LOW (ref 3.5–5.2)
Albumin: 3 g/dL — ABNORMAL LOW (ref 3.5–5.2)
CO2: 30 mEq/L (ref 19–32)
Calcium: 12.9 mg/dL — ABNORMAL HIGH (ref 8.4–10.5)
Calcium: 7.9 mg/dL — ABNORMAL LOW (ref 8.4–10.5)
Creatinine, Ser: 1.23 mg/dL — ABNORMAL HIGH (ref 0.4–1.2)
GFR calc Af Amer: 50 mL/min — ABNORMAL LOW (ref >60)
GFR calc Af Amer: >60 mL/min (ref >60)
GFR calc non Af Amer: 42 mL/min — ABNORMAL LOW (ref >60)
Potassium: 3.9 mEq/L (ref 3.5–5.1)
Sodium: 138 mEq/L (ref 135–145)
Sodium: 139 mEq/L (ref 135–145)
Total Protein: 5.3 g/dL — ABNORMAL LOW (ref 6.0–8.3)
Total Protein: 5.3 g/dL — ABNORMAL LOW (ref 6.0–8.3)

## 2010-08-20 LAB — BONE MARROW EXAM

## 2010-08-20 LAB — CBC
HCT: 29.3 % — ABNORMAL LOW (ref 36.0–46.0)
HCT: 29.5 % — ABNORMAL LOW (ref 36.0–46.0)
HCT: 31.3 % — ABNORMAL LOW (ref 36.0–46.0)
HCT: 24 % — ABNORMAL LOW (ref 36.0–46.0)
HCT: 26.2 % — ABNORMAL LOW (ref 36.0–46.0)
HCT: 29 % — ABNORMAL LOW (ref 36.0–46.0)
HCT: 31.6 % — ABNORMAL LOW (ref 36.0–46.0)
Hemoglobin: 10.6 g/dL — ABNORMAL LOW (ref 12.0–15.0)
Hemoglobin: 9.9 g/dL — ABNORMAL LOW (ref 12.0–15.0)
Hemoglobin: 8.1 g/dL — ABNORMAL LOW (ref 12.0–15.0)
Hemoglobin: 9.2 g/dL — ABNORMAL LOW (ref 12.0–15.0)
Hemoglobin: 9.5 g/dL — ABNORMAL LOW (ref 12.0–15.0)
Hemoglobin: 9.6 g/dL — ABNORMAL LOW (ref 12.0–15.0)
Hemoglobin: 9.7 g/dL — ABNORMAL LOW (ref 12.0–15.0)
MCHC: 33.9 g/dL (ref 30.0–36.0)
MCHC: 33 g/dL (ref 30.0–36.0)
MCHC: 33.8 g/dL (ref 30.0–36.0)
MCHC: 33.9 g/dL (ref 30.0–36.0)
MCHC: 34 g/dL (ref 30.0–36.0)
MCHC: 34.3 g/dL (ref 30.0–36.0)
MCHC: 34.8 g/dL (ref 30.0–36.0)
MCHC: 34.9 g/dL (ref 30.0–36.0)
MCV: 94.5 fL (ref 78.0–100.0)
MCV: 94.9 fL (ref 78.0–100.0)
MCV: 94.1 fL (ref 78.0–100.0)
MCV: 97.5 fL (ref 78.0–100.0)
MCV: 97.6 fL (ref 78.0–100.0)
MCV: 97.9 fL (ref 78.0–100.0)
MCV: 98 fL (ref 78.0–100.0)
MCV: 98 fL (ref 78.0–100.0)
Platelets: 178 10*3/uL (ref 150–400)
Platelets: 209 10*3/uL (ref 150–400)
Platelets: 181 10*3/uL (ref 150–400)
Platelets: 198 10*3/uL (ref 150–400)
Platelets: 234 10*3/uL (ref 150–400)
Platelets: 265 10*3/uL (ref 150–400)
RBC: 2.43 MIL/uL — ABNORMAL LOW (ref 3.87–5.11)
RBC: 2.45 MIL/uL — ABNORMAL LOW (ref 3.87–5.11)
RBC: 2.69 MIL/uL — ABNORMAL LOW (ref 3.87–5.11)
RBC: 2.7 MIL/uL — ABNORMAL LOW (ref 3.87–5.11)
RBC: 2.78 MIL/uL — ABNORMAL LOW (ref 3.87–5.11)
RBC: 2.93 MIL/uL — ABNORMAL LOW (ref 3.87–5.11)
RBC: 2.95 MIL/uL — ABNORMAL LOW (ref 3.87–5.11)
RBC: 3.23 MIL/uL — ABNORMAL LOW (ref 3.87–5.11)
RBC: 3.39 MIL/uL — ABNORMAL LOW (ref 3.87–5.11)
RDW: 16.7 % — ABNORMAL HIGH (ref 11.5–15.5)
RDW: 17 % — ABNORMAL HIGH (ref 11.5–15.5)
RDW: 14.2 % (ref 11.5–15.5)
RDW: 14.8 % (ref 11.5–15.5)
RDW: 14.8 % (ref 11.5–15.5)
RDW: 14.9 % (ref 11.5–15.5)
WBC: 10 10*3/uL (ref 4.0–10.5)
WBC: 10 10*3/uL (ref 4.0–10.5)
WBC: 10.2 10*3/uL (ref 4.0–10.5)
WBC: 10.7 10*3/uL — ABNORMAL HIGH (ref 4.0–10.5)
WBC: 11.1 10*3/uL — ABNORMAL HIGH (ref 4.0–10.5)
WBC: 14.9 10*3/uL — ABNORMAL HIGH (ref 4.0–10.5)
WBC: 15.4 10*3/uL — ABNORMAL HIGH (ref 4.0–10.5)
WBC: 8.3 10*3/uL (ref 4.0–10.5)

## 2010-08-20 LAB — CREATININE CLEARANCE, URINE, 24 HOUR
Creatinine Clearance: 105 mL/min (ref 75–115)
Creatinine, 24H Ur: 1386 mg/d (ref 700–1800)
Creatinine: 0.92 mg/dL (ref 0.40–1.20)

## 2010-08-20 LAB — GLUCOSE, CAPILLARY
Glucose-Capillary: 102 mg/dL — ABNORMAL HIGH (ref 70–99)
Glucose-Capillary: 103 mg/dL — ABNORMAL HIGH (ref 70–99)
Glucose-Capillary: 103 mg/dL — ABNORMAL HIGH (ref 70–99)
Glucose-Capillary: 104 mg/dL — ABNORMAL HIGH (ref 70–99)
Glucose-Capillary: 114 mg/dL — ABNORMAL HIGH (ref 70–99)
Glucose-Capillary: 120 mg/dL — ABNORMAL HIGH (ref 70–99)
Glucose-Capillary: 121 mg/dL — ABNORMAL HIGH (ref 70–99)
Glucose-Capillary: 132 mg/dL — ABNORMAL HIGH (ref 70–99)
Glucose-Capillary: 102 mg/dL — ABNORMAL HIGH (ref 70–99)
Glucose-Capillary: 113 mg/dL — ABNORMAL HIGH (ref 70–99)
Glucose-Capillary: 113 mg/dL — ABNORMAL HIGH (ref 70–99)
Glucose-Capillary: 115 mg/dL — ABNORMAL HIGH (ref 70–99)
Glucose-Capillary: 115 mg/dL — ABNORMAL HIGH (ref 70–99)
Glucose-Capillary: 116 mg/dL — ABNORMAL HIGH (ref 70–99)
Glucose-Capillary: 118 mg/dL — ABNORMAL HIGH (ref 70–99)
Glucose-Capillary: 120 mg/dL — ABNORMAL HIGH (ref 70–99)
Glucose-Capillary: 123 mg/dL — ABNORMAL HIGH (ref 70–99)
Glucose-Capillary: 123 mg/dL — ABNORMAL HIGH (ref 70–99)
Glucose-Capillary: 124 mg/dL — ABNORMAL HIGH (ref 70–99)
Glucose-Capillary: 127 mg/dL — ABNORMAL HIGH (ref 70–99)
Glucose-Capillary: 145 mg/dL — ABNORMAL HIGH (ref 70–99)
Glucose-Capillary: 157 mg/dL — ABNORMAL HIGH (ref 70–99)
Glucose-Capillary: 163 mg/dL — ABNORMAL HIGH (ref 70–99)
Glucose-Capillary: 173 mg/dL — ABNORMAL HIGH (ref 70–99)
Glucose-Capillary: 86 mg/dL (ref 70–99)

## 2010-08-20 LAB — MAGNESIUM
Magnesium: 1.4 mg/dL — ABNORMAL LOW (ref 1.5–2.5)
Magnesium: 1.9 mg/dL (ref 1.5–2.5)
Magnesium: 1.3 mg/dL — ABNORMAL LOW (ref 1.5–2.5)
Magnesium: 1.5 mg/dL (ref 1.5–2.5)
Magnesium: 2.1 mg/dL (ref 1.5–2.5)
Magnesium: 2.2 mg/dL (ref 1.5–2.5)

## 2010-08-20 LAB — DIFFERENTIAL
Basophils Relative: 1 % (ref 0–1)
Eosinophils Absolute: 0 10*3/uL (ref 0.0–0.7)
Eosinophils Relative: 0 % (ref 0–5)
Eosinophils Relative: 0 % (ref 0–5)
Eosinophils Relative: 4 % (ref 0–5)
Lymphocytes Relative: 3 % — ABNORMAL LOW (ref 12–46)
Lymphocytes Relative: 9 % — ABNORMAL LOW (ref 12–46)
Lymphs Abs: 0.5 10*3/uL — ABNORMAL LOW (ref 0.7–4.0)
Lymphs Abs: 0.6 10*3/uL — ABNORMAL LOW (ref 0.7–4.0)
Monocytes Absolute: 0.9 10*3/uL (ref 0.1–1.0)
Monocytes Absolute: 1.1 10*3/uL — ABNORMAL HIGH (ref 0.1–1.0)
Monocytes Relative: 10 % (ref 3–12)
Monocytes Relative: 7 % (ref 3–12)
Monocytes Relative: 9 % (ref 3–12)
Neutro Abs: 6.8 10*3/uL (ref 1.7–7.7)

## 2010-08-20 LAB — URINALYSIS, ROUTINE W REFLEX MICROSCOPIC
Bilirubin Urine: NEGATIVE
Glucose, UA: NEGATIVE mg/dL
Ketones, ur: NEGATIVE mg/dL
Nitrite: NEGATIVE
Specific Gravity, Urine: 1.015 (ref 1.005–1.030)
pH: 6.5 (ref 5.0–8.0)

## 2010-08-20 LAB — PROTIME-INR
INR: 1.3 (ref 0.00–1.49)
Prothrombin Time: 17.1 seconds — ABNORMAL HIGH (ref 11.6–15.2)

## 2010-08-20 LAB — POCT CARDIAC MARKERS
CKMB, poc: <1 ng/mL — ABNORMAL LOW (ref 1.0–8.0)
Myoglobin, poc: 154 ng/mL (ref 12–200)
Troponin i, poc: <0.05 ng/mL (ref 0.00–0.09)
Troponin i, poc: <0.05 ng/mL (ref 0.00–0.09)

## 2010-08-20 LAB — CROSSMATCH: ABO/RH(D): O POS

## 2010-08-20 LAB — URINE MICROSCOPIC-ADD ON

## 2010-08-20 LAB — UIFE/LIGHT CHAINS/TP QN, 24-HR UR
Albumin, U: DETECTED
Alpha 1, Urine: DETECTED — AB
Free Lambda Excretion/Day: 19.95 mg/d
Free Lambda Lt Chains,Ur: 0.57 mg/dL (ref 0.08–1.01)
Time: 24 hours
Total Protein, Urine-Ur/day: 8274 mg/d — ABNORMAL HIGH (ref 10–140)
Total Protein, Urine: 236.4 mg/dL

## 2010-08-20 LAB — CHROMOSOME ANALYSIS, BONE MARROW

## 2010-08-20 LAB — PROTEIN, URINE, 24 HOUR
Collection Interval-UPROT: 24 hours
Urine Total Volume-UPROT: 3500 mL

## 2010-08-20 LAB — CULTURE, BLOOD (ROUTINE X 2): Culture: NO GROWTH

## 2010-08-20 LAB — PROTEIN / CREATININE RATIO, URINE
Creatinine, Urine: 108 mg/dL
Total Protein, Urine: 402 mg/dL

## 2010-08-20 LAB — NA AND K (SODIUM & POTASSIUM), RAND UR: Sodium, Ur: 70 mEq/L

## 2010-08-21 LAB — COMPREHENSIVE METABOLIC PANEL
ALT: 19 U/L (ref 0–35)
Albumin: 3.4 g/dL — ABNORMAL LOW (ref 3.5–5.2)
Alkaline Phosphatase: 81 U/L (ref 39–117)
Calcium: 10.9 mg/dL — ABNORMAL HIGH (ref 8.4–10.5)
GFR calc Af Amer: >60 mL/min (ref >60)
Potassium: 2.8 mEq/L — ABNORMAL LOW (ref 3.5–5.1)
Sodium: 144 mEq/L (ref 135–145)
Total Protein: 5.9 g/dL — ABNORMAL LOW (ref 6.0–8.3)

## 2010-08-21 LAB — CBC
HCT: 33.8 % — ABNORMAL LOW (ref 36.0–46.0)
HCT: 34.4 % — ABNORMAL LOW (ref 36.0–46.0)
HCT: 36.1 % (ref 36.0–46.0)
Hemoglobin: 11.3 g/dL — ABNORMAL LOW (ref 12.0–15.0)
Hemoglobin: 11.6 g/dL — ABNORMAL LOW (ref 12.0–15.0)
Hemoglobin: 12 g/dL (ref 12.0–15.0)
Hemoglobin: 12.1 g/dL (ref 12.0–15.0)
MCHC: 33.5 g/dL (ref 30.0–36.0)
MCHC: 33.5 g/dL (ref 30.0–36.0)
MCHC: 33.8 g/dL (ref 30.0–36.0)
MCV: 98.6 fL (ref 78.0–100.0)
MCV: 98.8 fL (ref 78.0–100.0)
MCV: 98.8 fL (ref 78.0–100.0)
MCV: 99.2 fL (ref 78.0–100.0)
Platelets: 168 10*3/uL (ref 150–400)
Platelets: 169 10*3/uL (ref 150–400)
Platelets: 175 10*3/uL (ref 150–400)
Platelets: 176 10*3/uL (ref 150–400)
Platelets: 176 10*3/uL (ref 150–400)
RBC: 3.38 MIL/uL — ABNORMAL LOW (ref 3.87–5.11)
RBC: 3.43 MIL/uL — ABNORMAL LOW (ref 3.87–5.11)
RDW: 14.1 % (ref 11.5–15.5)
RDW: 14.2 % (ref 11.5–15.5)
RDW: 14.2 % (ref 11.5–15.5)
RDW: 14.5 % (ref 11.5–15.5)
WBC: 7.2 10*3/uL (ref 4.0–10.5)
WBC: 7.8 10*3/uL (ref 4.0–10.5)

## 2010-08-21 LAB — BASIC METABOLIC PANEL
BUN: 4 mg/dL — ABNORMAL LOW (ref 6–23)
BUN: 7 mg/dL (ref 6–23)
BUN: 7 mg/dL (ref 6–23)
BUN: 7 mg/dL (ref 6–23)
BUN: 8 mg/dL (ref 6–23)
CO2: 30 mEq/L (ref 19–32)
CO2: 31 mEq/L (ref 19–32)
CO2: 31 mEq/L (ref 19–32)
CO2: 31 mEq/L (ref 19–32)
CO2: 34 mEq/L — ABNORMAL HIGH (ref 19–32)
Calcium: 11.8 mg/dL — ABNORMAL HIGH (ref 8.4–10.5)
Chloride: 101 mEq/L (ref 96–112)
Chloride: 103 mEq/L (ref 96–112)
Chloride: 103 mEq/L (ref 96–112)
Chloride: 104 mEq/L (ref 96–112)
Chloride: 108 mEq/L (ref 96–112)
Chloride: 98 mEq/L (ref 96–112)
GFR calc Af Amer: >60 mL/min (ref >60)
GFR calc Af Amer: >60 mL/min (ref >60)
GFR calc Af Amer: >60 mL/min (ref >60)
GFR calc Af Amer: >60 mL/min (ref >60)
GFR calc non Af Amer: >60 mL/min (ref >60)
GFR calc non Af Amer: >60 mL/min (ref >60)
GFR calc non Af Amer: >60 mL/min (ref >60)
GFR calc non Af Amer: >60 mL/min (ref >60)
GFR calc non Af Amer: >60 mL/min (ref >60)
Glucose, Bld: 113 mg/dL — ABNORMAL HIGH (ref 70–99)
Glucose, Bld: 115 mg/dL — ABNORMAL HIGH (ref 70–99)
Glucose, Bld: 117 mg/dL — ABNORMAL HIGH (ref 70–99)
Glucose, Bld: 122 mg/dL — ABNORMAL HIGH (ref 70–99)
Glucose, Bld: 123 mg/dL — ABNORMAL HIGH (ref 70–99)
Glucose, Bld: 128 mg/dL — ABNORMAL HIGH (ref 70–99)
Potassium: 3.1 mEq/L — ABNORMAL LOW (ref 3.5–5.1)
Potassium: 3.4 mEq/L — ABNORMAL LOW (ref 3.5–5.1)
Potassium: 3.4 mEq/L — ABNORMAL LOW (ref 3.5–5.1)
Potassium: 3.5 mEq/L (ref 3.5–5.1)
Potassium: 3.8 mEq/L (ref 3.5–5.1)
Potassium: 4.2 mEq/L (ref 3.5–5.1)
Potassium: 4.4 mEq/L (ref 3.5–5.1)
Sodium: 136 mEq/L (ref 135–145)
Sodium: 139 mEq/L (ref 135–145)
Sodium: 140 mEq/L (ref 135–145)
Sodium: 140 mEq/L (ref 135–145)
Sodium: 140 mEq/L (ref 135–145)

## 2010-08-21 LAB — APTT: aPTT: 28 seconds (ref 24–37)

## 2010-08-21 LAB — DIFFERENTIAL
Basophils Relative: 0 % (ref 0–1)
Eosinophils Absolute: 0.1 10*3/uL (ref 0.0–0.7)
Lymphs Abs: 1.3 10*3/uL (ref 0.7–4.0)
Monocytes Absolute: 0.7 10*3/uL (ref 0.1–1.0)
Monocytes Relative: 8 % (ref 3–12)

## 2010-08-21 LAB — MAGNESIUM: Magnesium: 1.6 mg/dL (ref 1.5–2.5)

## 2010-08-21 LAB — SEDIMENTATION RATE: Sed Rate: 33 mm/hr — ABNORMAL HIGH (ref 0–22)

## 2010-09-05 ENCOUNTER — Ambulatory Visit (HOSPITAL_COMMUNITY)
Admission: RE | Admit: 2010-09-05 | Discharge: 2010-09-05 | Disposition: A | Discharge status: Home / Self Care | Payer: MEDICARE | Source: Ambulatory Visit | Attending: Oncology | Admitting: Oncology

## 2010-09-05 ENCOUNTER — Inpatient Hospital Stay (HOSPITAL_COMMUNITY): Admission: RE | Admit: 2010-09-05 | Payer: MEDICARE | Source: Ambulatory Visit

## 2010-09-05 ENCOUNTER — Other Ambulatory Visit (HOSPITAL_COMMUNITY): Payer: Self-pay | Admitting: Oncology

## 2010-09-05 ENCOUNTER — Encounter (HOSPITAL_BASED_OUTPATIENT_CLINIC_OR_DEPARTMENT_OTHER): Payer: MEDICARE | Admitting: Oncology

## 2010-09-05 DIAGNOSIS — M8560 Other cyst of bone, unspecified site: Secondary | ICD-10-CM | POA: Insufficient documentation

## 2010-09-05 DIAGNOSIS — C9000 Multiple myeloma not having achieved remission: Secondary | ICD-10-CM

## 2010-09-05 DIAGNOSIS — X58XXXA Exposure to other specified factors, initial encounter: Secondary | ICD-10-CM | POA: Insufficient documentation

## 2010-09-05 DIAGNOSIS — E559 Vitamin D deficiency, unspecified: Secondary | ICD-10-CM

## 2010-09-05 DIAGNOSIS — S32009A Unspecified fracture of unspecified lumbar vertebra, initial encounter for closed fracture: Secondary | ICD-10-CM | POA: Insufficient documentation

## 2010-09-05 DIAGNOSIS — M949 Disorder of cartilage, unspecified: Secondary | ICD-10-CM | POA: Insufficient documentation

## 2010-09-05 DIAGNOSIS — S22009A Unspecified fracture of unspecified thoracic vertebra, initial encounter for closed fracture: Secondary | ICD-10-CM | POA: Insufficient documentation

## 2010-09-05 DIAGNOSIS — R197 Diarrhea, unspecified: Secondary | ICD-10-CM

## 2010-09-05 DIAGNOSIS — M899 Disorder of bone, unspecified: Secondary | ICD-10-CM | POA: Insufficient documentation

## 2010-09-05 DIAGNOSIS — R609 Edema, unspecified: Secondary | ICD-10-CM

## 2010-09-05 DIAGNOSIS — D1620 Benign neoplasm of long bones of unspecified lower limb: Secondary | ICD-10-CM | POA: Insufficient documentation

## 2010-09-05 LAB — CBC WITH DIFFERENTIAL/PLATELET
BASO%: 0 % (ref 0.0–2.0)
EOS%: 0.4 % (ref 0.0–7.0)
HCT: 42.9 % (ref 34.8–46.6)
LYMPH%: 10.8 % — ABNORMAL LOW (ref 14.0–49.7)
MCH: 33.8 pg (ref 25.1–34.0)
MCHC: 33.4 g/dL (ref 31.5–36.0)
MONO#: 0.1 10*3/uL (ref 0.1–0.9)
NEUT%: 87.5 % — ABNORMAL HIGH (ref 38.4–76.8)
Platelets: 152 10*3/uL (ref 145–400)
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
## 2010-09-24 NOTE — Consult Note (Signed)
NAME:  Lindsey Dalton, Lindsey Dalton NO.:  [id number]   MEDICAL RECORD NO.:  [id number]          PATIENT TYPE:  INP   LOCATION:  1438                         FACILITY:  [facility]   PHYSICIAN:  Jordan Hawks. Elnoria Howard, MD    DATE OF BIRTH:  Jan 08, 1924   DATE OF CONSULTATION:  09/21/2008  DATE OF DISCHARGE:                                 CONSULTATION   REASON FOR CONSULTATION:  Hematochezia.   REFERRING PHYSICIAN:  Dr. Rinaldo Cloud.   HISTORY OF PRESENT ILLNESS:  This is an 77 year old female with a recent  diagnosis of multiple myelomas, diabetes, hypertension, hyperlipidemia,  hypercalcemia and compression fractures who was admitted to the hospital  for a left lower lobe pneumonia.  The patient was being treated for that  type of issue and subsequently has been doing well from that standpoint.  However, acutely at 11:00 this morning the patient started having  hematochezia.  The patient denied having any abdominal pain, nausea,  vomiting, no prior history of hematochezia in the past.  The nurse noted  that there was a significant amount of blood initially.  It could not be  determined if this was from a vaginal source versus a colonic source.  However, with further cleaning of the patient, it was clear that this  was coming from a colonic source.  Because of this type of finding, a GI  consultation was requested.  The patient is uncertain if she has had a  colonoscopy in the past.  There is no family history of colon cancer.  Recently she was started on treatment for her multiple myeloma with  Revlimid  and is also noted in the prior medications that she was on  prednisone.  No reports of any recent NSAID use.   PAST MEDICAL AND SURGICAL HISTORY:  As stated above.   FAMILY HISTORY:  Noncontributory.   SOCIAL HISTORY:  Negative for alcohol, tobacco or illicit drug use.   ALLERGIES:  No known drug allergies.   MEDICATIONS:  1. Azithromycin 500 mg IV daily.  2. Ceftriaxone 1 gram  IV q. day.  3. Sliding-scale insulin.  4. Cozaar 50 mg p.o. daily.  5. Metoprolol 25 mg p.o. b.i.d.  6. Protonix 40 mg p.o. daily.  7. Zocor 40 mg p.o. daily.  8. Zofran 8 mg IV q.8 hours.  9. Phenergan 25 mg p.o. q.8 hours.   PHYSICAL EXAMINATION:  VITAL SIGNS:  Blood pressure is 160/94, heart  rate is 91, respirations 20, temperature is 97.9.  GENERAL:  The patient is in no acute distress, alert and oriented.  HEENT:  Normocephalic, atraumatic.  Extraocular muscles intact.  NECK:  Is supple.  No lymphadenopathy.  LUNGS:  Clear to auscultation bilaterally.  CARDIOVASCULAR:  Regular rhythm.  ABDOMEN:  Mildly obese, soft, nontender, nondistended.  Positive bowel  sounds.  EXTREMITIES:  No clubbing, cyanosis or edema.  RECTAL:  Exam was attempted.  However, there is a large amount of blood  clots extruding from the anus.  Therefore, the examination was not  performed.   LABORATORY VALUES:  White blood cell count 14.9, hemoglobin 9.7,  platelets 253,000.  Sodium is 138, potassium 2.7, chloride 114, CO2 21,  glucose 132, BUN 70, creatinine 0.9, AST, total bilirubin 0.3, alk phos  76, AST 37, ALT is 29, albumin 2.5.   ASSESSMENT AND PLAN:  1. Hematochezia.  The patient will require further evaluation with a      colonoscopy and possible upper endoscopy.  She has a significant      amount of blood of bleeding and currently but her hemoglobin      appears to be stable as well her hemodynamic status.  Most likely      she will drop over the intervening hours and transfusion will be      required.  I am uncertain about the source but with a clinical      history of his painless and voluminous bleeding a diverticular      bleed is high on the list.  Further evaluation with the colonoscopy      and possible EGD will be performed. Plan is to prep for the      endoscopic procedures.  2. Follow hemoglobin q.6 hours.  3. Transfer the patient to step-down.      Jordan Hawks Elnoria Howard, MD   Electronically Signed     PDH/MEDQ  D:  09/21/2008  T:  09/21/2008  Job:  [id number]   cc:   [name]. [name], M.D.  Fax: [phone]   Samul Dada, M.D.  Fax: [phone]

## 2010-09-24 NOTE — Consult Note (Signed)
NAME:  Lindsey Dalton, BURROUGH            ACCOUNT NO.:  [id number]   MEDICAL RECORD NO.:  [id number]          PATIENT TYPE:  OUT   LOCATION:  XRAY                         FACILITY:  [facility]   PHYSICIAN:  Sanjeev K. Deveshwar, M.D.DATE OF BIRTH:  May 31, 1923   DATE OF CONSULTATION:  09/14/2008  DATE OF DISCHARGE:                                 CONSULTATION   CHIEF COMPLAINT:  Status post kyphoplasties at T11, T12, and L1  performed August 17, 2008.   HISTORY:  This is a very pleasant 77 year old female, who was admitted  to [facility] on August 14, 2008 to August 23, 2008 by Dr. [name]  for evaluation of back pain.  The patient had at least 2 falls on the  morning of admission.  She was found to have compression fractures at  T11, T12, and L1.  There is a suspicion for multiple myeloma.  She  underwent kyphoplasties at T11, T12, an L1 on August 17, 2008, with a  significant relief in her pain.  Her biopsy showed variable  plasmacytosis consistent with plasma cell dyscrasia and she was referred  to Oncology.  The patient returns today accompanied by her granddaughter  to be seen in followup.   PAST MEDICAL HISTORY:  The patient has a history of non-insulin-  dependent diabetes mellitus, which is controlled by diet.  The patient  and her granddaughter stated today that they were not aware that the  patient had diabetes.  She also has a history of hypertension.  She has  a history of mild coronary artery disease, hyperlipidemia, degenerative  joint disease, and multiple myeloma.   SURGICAL HISTORY:  Significant for a hysterectomy.  She denies any  previous problems with anesthesia.   ALLERGIES:  No known drug allergies.   CURRENT MEDICATIONS:  1. Oxycodone for pain.  2. She is also on Hyzaar, I believe 50/12.5 daily for hypertension.  3. Atenolol 50 mg daily.  4. Simvastatin 40 mg at bedtime.  5. I believe Xanax p.r.n.  6. She takes oxycodone for pain.  7. She had previously been on  aspirin 81 mg daily, but this was      stopped at some point possibly for the kyphoplasty procedure.  It      was recommended today that the patient should probably restart this      medication if there are no other contraindications.   SOCIAL HISTORY:  The patient is widowed.  She is retired from [employer].  She has never smoked.  She does not use alcohol.  She  had been living alone in [place].  She currently is a resident at the  [facility] in [place].  She is followed by Dr. Marijo Conception at that facility.  The patient had 2  children, both of whom are deceased.  She has 5 living grandchildren,  who are supportive.   FAMILY HISTORY:  The patient does not know how old her parents were when  they passed away.  She does not know their cause of death.   IMPRESSION AND PLAN:  The  patient returns today after undergoing  kyphoplasties at 3 levels on August 17, 2008, with recently diagnosed  multiple myeloma.  She is due to meet with Dr. Darnelle Catalan later today for  her first visit.  Her biopsy did show variable plasmacytosis consistent  with plasma cell dyscrasia.   The patient is currently participating in physical therapy and she is  able to walk with a rolling walker.  She reports that her pain was  significantly improved after undergoing kyphoplasties, however,  approximately 2 weeks after the intervention.  She developed new pain,  we believe at a higher level in her spine that tends to be quite severe  and radiates around to her side as well as to her sternum.  There is  some concern that she may have developed a subsequent fracture.  Dr.  Corliss Skains has recommended a followup MRI to look for a new fracture.  He  recommended that the patient and her granddaughter discussed this with  Dr. Darnelle Catalan at their visit later today and then call us if they would  like to proceed with a repeat MRI.   We did discuss compression  fractures as well as their treatment.  The  patient was shown a video describing the kyphoplasty procedure.  Dr.  Corliss Skains also reviewed the images from the procedure itself with the  patient and her granddaughter.  All of their questions were answered.  Greater than 25 minutes was spent on this followup visit.      Delton See, P.A.    ______________________________  Grandville Silos. Corliss Skains, M.D.    DR/MEDQ  D:  09/14/2008  T:  09/15/2008  Job:  [id number]   cc:   [name], M.D.  [name]. [name], M.D.  Valentino Hue. Magrinat, M.D.  Bennett Scrape, MD
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
## 2010-09-27 NOTE — Cardiovascular Report (Signed)
NAME:  Lindsey Dalton, Lindsey Dalton NO.:  [id number]   MEDICAL RECORD NO.:  [id number]          PATIENT TYPE:  OIB   LOCATION:  1963                         FACILITY:  [facility]   PHYSICIAN:  Mohan N. [name], M.D. DATE OF BIRTH:  Feb 27, 1924   DATE OF PROCEDURE:  03/13/2005  DATE OF DISCHARGE:                              CARDIAC CATHETERIZATION   PROCEDURE:  Left cardiac catheterization with selective left and right  coronary angiograph, left ventriculography via right groin using Judkins  technique   INDICATIONS FOR PROCEDURE:  Ms. Wailes is a 77 year old black female  with past medical history significant for hypertension, hypercholesteremia,  glucose intolerance, allergic rhinitis, degenerative joint disease.  Complains of retrosternal vague chest pain off and on without associated  symptoms of nausea, vomiting or diaphoresis.  The patient denies shortness  of breath.  Denies palpitation, lightheadedness or syncope.  The patient underwent Persantine Myoview on February 26, 2005, which was  positive by EKG criteria, but Myoview scan was negative for reversible  ischemia.  Due to recurrent chest pain and multiple risk factors and  positive Persantine stress test by EKG criteria, I discussed with the  patient regarding left cath, its risks and benefits, i.e. death, MI, stroke,  local vascular complications, etc., and consented for the procedure.   PROCEDURE:  After obtaining informed consent the patient was brought to the  catheterization lab and was placed on fluoroscope table.  The right groin  was prepped and draped in the usual fashion.  Xylocaine 2% was used for  local anesthesia in the right groin.  With the help of thin-wall needle, a 4-  French arterial sheath was placed.  The sheath was aspirated and flushed.  Next, a 4-French left Judkins catheter was advanced over the wire under  fluoroscopic guidance into the ascending aorta.  The wire was pulled out,  the  catheter was aspirated and connected to the manifold.  The catheter was  further advanced and engaged into the left coronary ostium.  Multiple views  of the left system were taken.  Next the catheter was disengaged and was  pulled out over the wire was replaced with 4-French right Judkins catheter,  which was advanced over the wire under fluoroscopic guidance up to the  ascending aorta.  The wire was pulled out, the catheter was aspirated and  connected to the manifold.  The catheter was further advanced and engaged  into the right coronary ostium.  Multiple views of the right system were  taken.  Next the catheter was disengaged and was pulled out over the wire was  replaced with 4-French pigtail catheter, which was advanced over the wire  under fluoroscopic guidance into the ascending aorta.  Wire was pulled out,  the catheter was aspirated and connected to the manifold.  The catheter was  further advanced across aortic valve into the LV.  LV pressures were  recorded.  Next left ventriculography was done in 30 degree RAO position.  Post-angiographic pressures were recorded from LV and then pullback  pressures were recorded from the aorta.  There was no gradient across the  aortic valve.  Next the pigtail catheter was pulled out over the wire.  Sheaths aspirated and flushed.   FINDINGS:  LV showed good LV systolic function, EF of 60-65%.  Left main was  very short, which was patent.  LAD was small and short, which was patent in proximal and midportion and  then diffusely diseased before reaching the apex.  Diagonal 1 has 20-30%  proximal stenosis.  Diagonal 2 was very, very small, which was patent.  Left  circumflex had 10% proximal stenosis and then it tapers down in AV groove  after giving off large OM-1.  OM-1  was large, which has 20-30% mid stenosis.  RCA was large, which was patent.  The patient has right-dominant coronary system.  The patient tolerated  procedure well.  There  were no complications.  The patient was transferred to recovery room  in stable condition.           ______________________________  [name] [name], M.D.     M[facility]/MEDQ  D:  03/13/2005  T:  03/13/2005  Job:  [id number]   cc:   Cardiac Catheterization Lab

## 2010-11-01 ENCOUNTER — Other Ambulatory Visit (HOSPITAL_COMMUNITY): Payer: Self-pay | Admitting: Oncology

## 2010-11-01 ENCOUNTER — Encounter (HOSPITAL_BASED_OUTPATIENT_CLINIC_OR_DEPARTMENT_OTHER): Payer: Medicare Other | Admitting: Oncology

## 2010-11-01 DIAGNOSIS — R197 Diarrhea, unspecified: Secondary | ICD-10-CM

## 2010-11-01 DIAGNOSIS — C9000 Multiple myeloma not having achieved remission: Secondary | ICD-10-CM

## 2010-11-01 DIAGNOSIS — R609 Edema, unspecified: Secondary | ICD-10-CM

## 2010-11-01 DIAGNOSIS — E559 Vitamin D deficiency, unspecified: Secondary | ICD-10-CM

## 2010-11-01 LAB — CBC WITH DIFFERENTIAL/PLATELET
Basophils Absolute: 0 10*3/uL (ref 0.0–0.1)
EOS%: 0.1 % (ref 0.0–7.0)
HGB: 13.5 g/dL (ref 11.6–15.9)
LYMPH%: 10.7 % — ABNORMAL LOW (ref 14.0–49.7)
MCH: 32.8 pg (ref 25.1–34.0)
MCV: 98.5 fL (ref 79.5–101.0)
MONO%: 25.1 % — ABNORMAL HIGH (ref 0.0–14.0)
NEUT%: 64 % (ref 38.4–76.8)
RDW: 15.9 % — ABNORMAL HIGH (ref 11.2–14.5)

## 2010-11-02 LAB — COMPREHENSIVE METABOLIC PANEL
Alkaline Phosphatase: 75 U/L (ref 39–117)
BUN: 12 mg/dL (ref 6–23)
Creatinine, Ser: 0.72 mg/dL (ref 0.50–1.10)
Glucose, Bld: 92 mg/dL (ref 70–99)
Sodium: 140 mEq/L (ref 135–145)
Total Bilirubin: 0.3 mg/dL (ref 0.3–1.2)

## 2010-11-02 LAB — VITAMIN D 25 HYDROXY (VIT D DEFICIENCY, FRACTURES): Vit D, 25-Hydroxy: 44 ng/mL (ref 30–89)

## 2010-11-11 IMAGING — XA IR KYPHOPLASTY LUMBAR INIT
1 series · 12 of 24 positions shown · non-contrast
Comparison: MRI scan of lumbar spine of 08/15/2008.

CLINICAL DATA: Severe low back pain secondary to compression
fractures at T12, L1 and T11.

BALLOON KYPHOPLASTY AT T11, T12 AND L1

[Series 1: run · 12 of 97 slices shown]
[im 5/97]
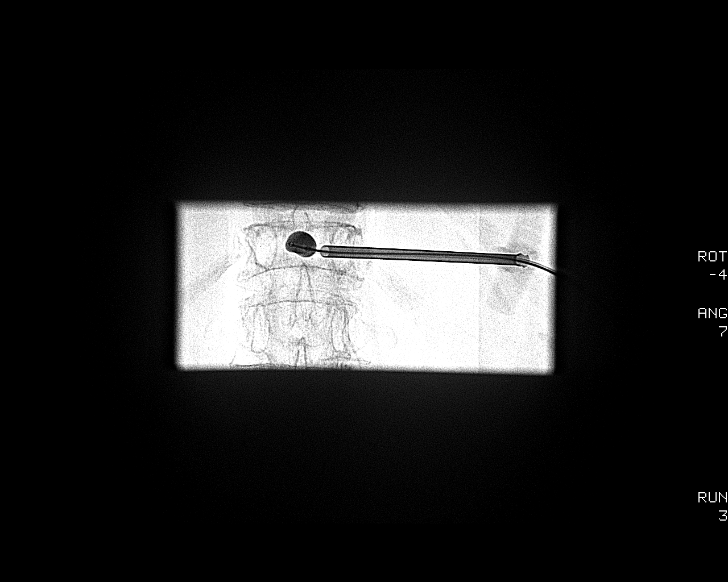
[im 13/97]
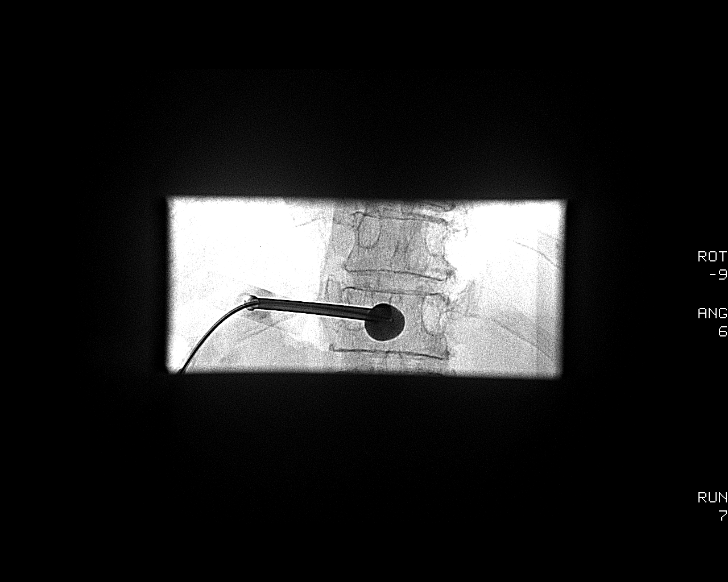
[im 21/97]
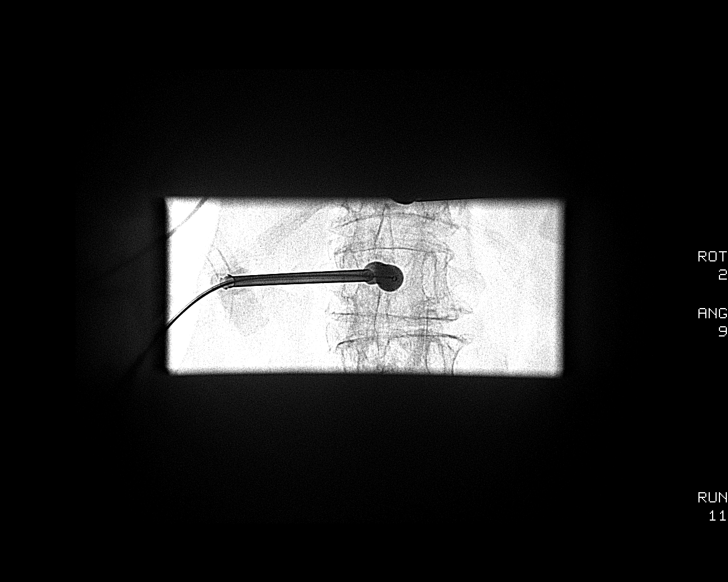
[im 30/97]
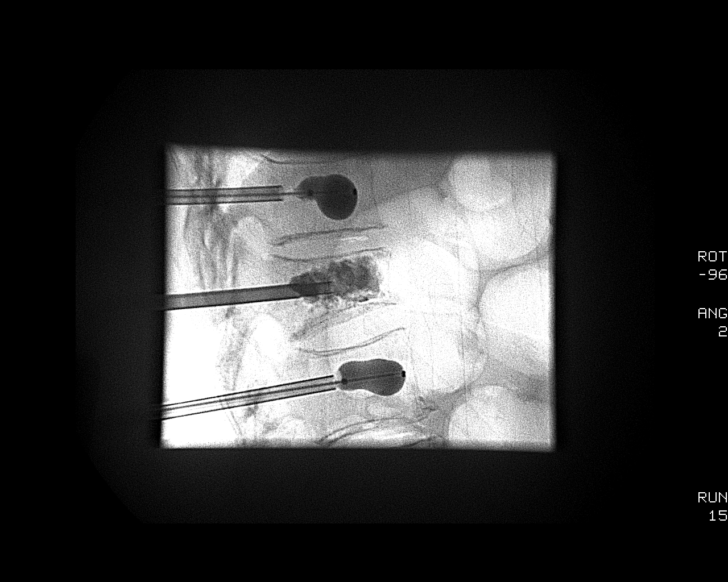
[im 38/97]
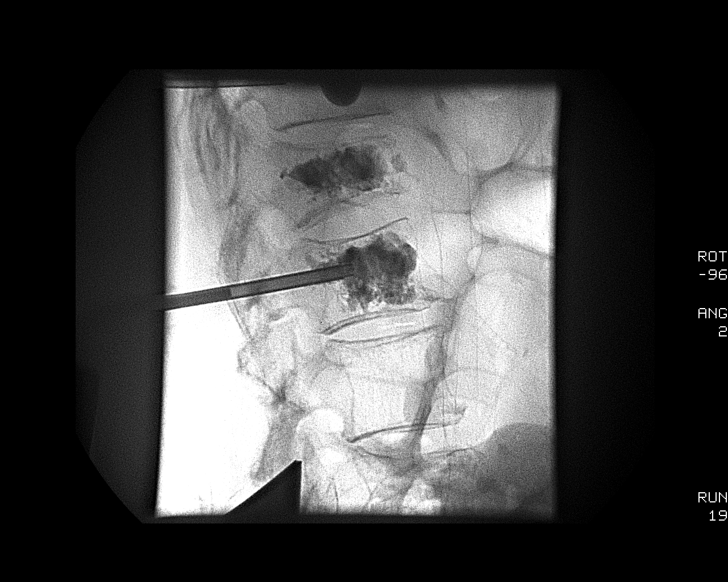
[im 46/97]
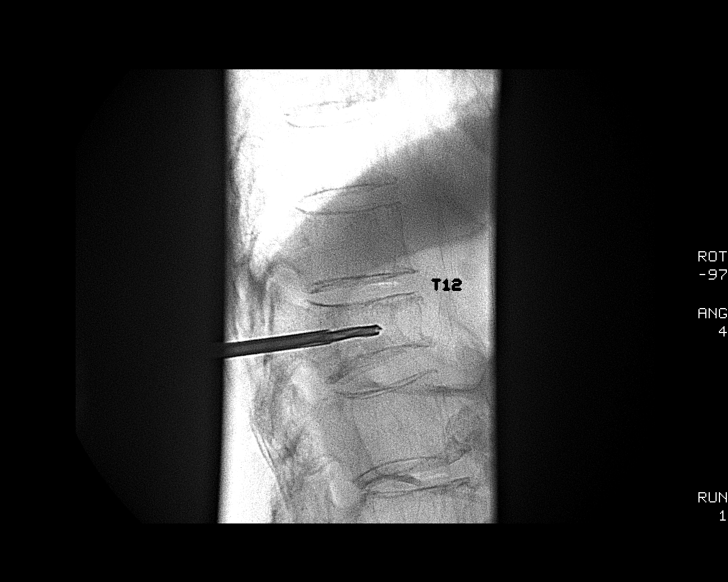
[im 55/97]
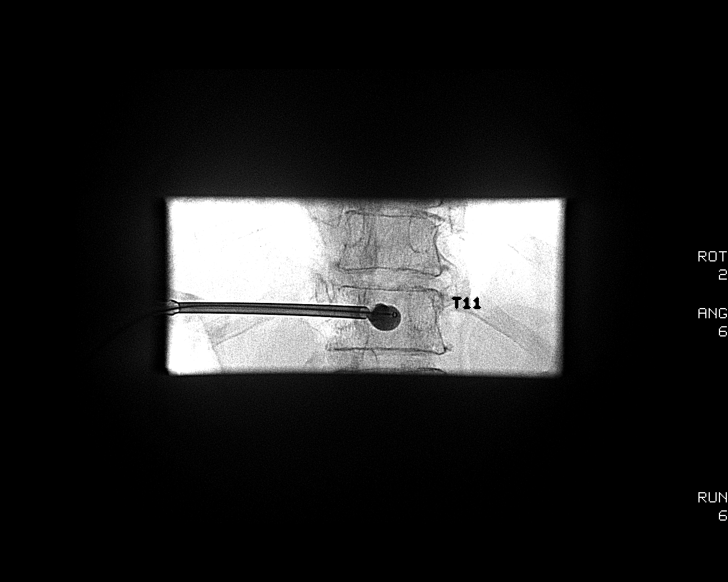
[im 63/97]
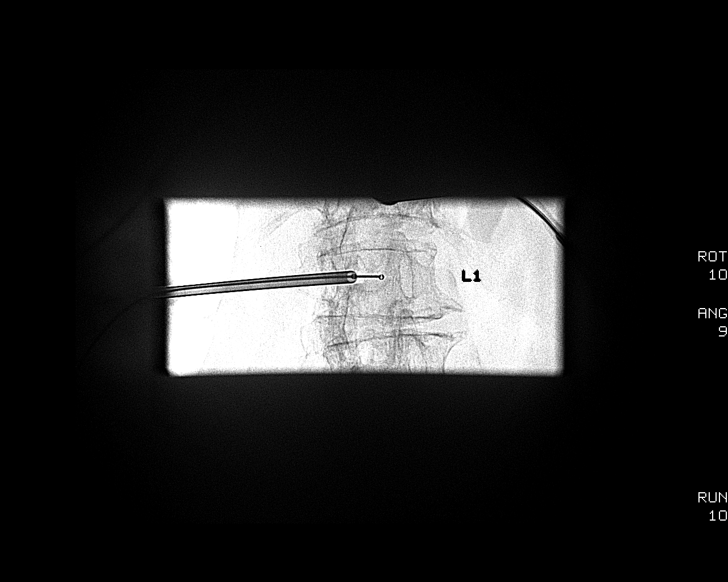
[im 71/97]
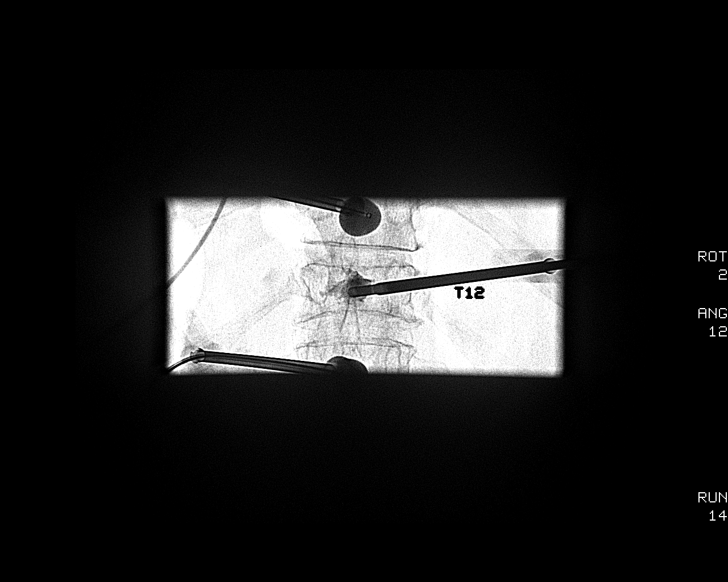
[im 80/97]
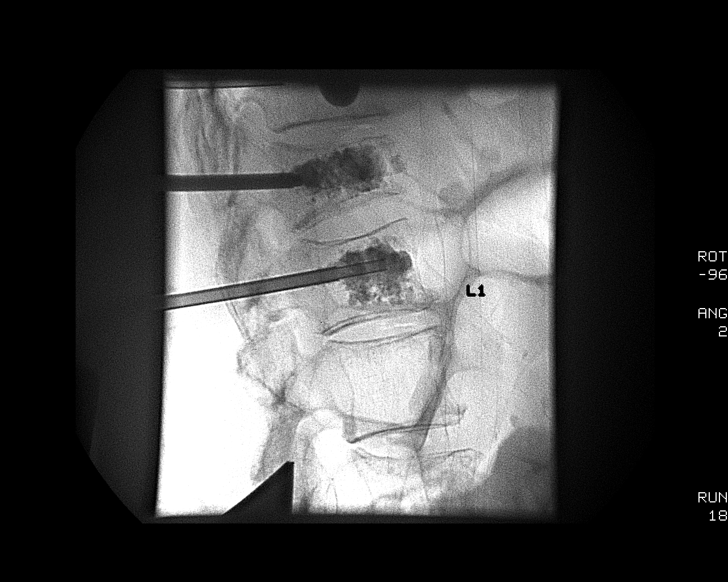
[im 88/97]
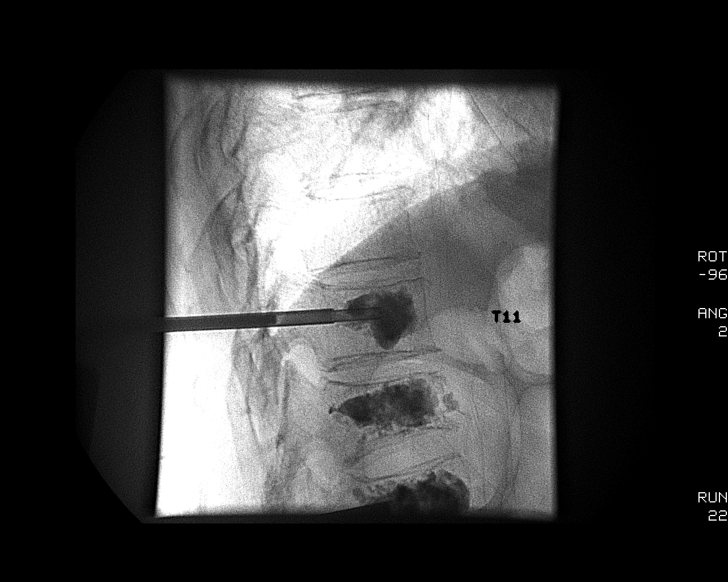
[im 97/97]
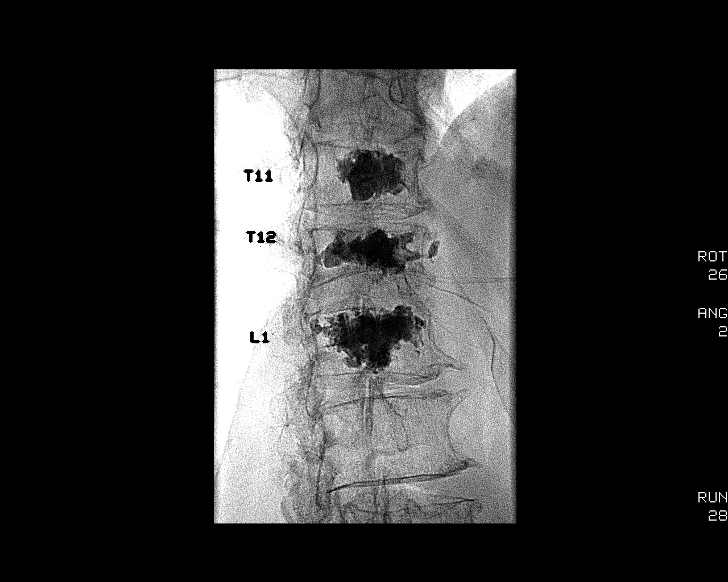

[12 of 24 positions shown; findings below may reference images not displayed]

Following a full explanation of the procedure along with the
potential associated complications, an informed witnessed consent
was obtained.

The patient was placed prone on the fluoroscopic table.  The skin
overlying the thoracolumbar region was then prepped and draped in
the usual sterile fashion.

The right pedicle at T12 and the left pedicle at T11 and L1 were
then infiltrated with 0.25% bupivacaine followed by the advancement
of 11-gauge Mongela Marichuy at these three levels into the
posterior aspects of T11, T12 and L1.

Using biplane intermittent fluoroscopy, the Mongela Marichuy at the
three levels were then exchanged for the Kyphon advanced osteo
introducer system comprised of a working cannula and a Kyphon Osteo
drill over Kyphon osteo bone pins.

These combinations were then advanced using biplane intermittent
fluoroscopy over the Kyphon osteo bone pins until the Kyphon osteo
drills were in the posterior one-third at T11, T12 and L1.

At this time the bone pins were removed.  In a medial trajectory,
the combinations at the three levels were then advanced until the
working cannulae were in the posterior aspects at the three levels.

The Kyphon osteo drills were removed and core samples sent for
pathologic analysis in separate vials from the three levels.

At each level, a Kyphon bone biopsy device was then advanced to
within 5 mm of the anterior aspect of T11, T12 and L1 and the core
samples from these were also sent for pathologic analysis.

Kyphon inflatable bone tamps 15 x 3 were then advanced at T11 and
T12 with the distal markers positioned approximately 5 mm from the
anterior borders at the two levels.

At L1, a Kyphon inflatable bone tamp 20 x 3 was then advanced and
positioned approximately 5 mm from the anterior aspect of T12 also.
Crossing of the midline was noted at all three levels.

These balloons were then expanded with contrast using the Kyphon
inflation syringe device via microtubing at the three levels.
Inflations were continued until there was apposition with the
superior endplate at T12, the inferior endplate at T11, and the
anterior border at L1.

Methylmethacrylate mixture was then reconstituted with Tobramycin
in the Kyphon bone mixing device system.  These were then loaded
onto the Kyphon bone fillers.

The balloons were deflated and removed followed by the instillation
of approximately 3 bone filler equivalents of methylmethacrylate
mixture at T12, 4 bone filler equivalents of methylmethacrylate
mixture at T11 and L1.  Excellent filling was obtained in the AP
and lateral dimensions.  At T12, there was a tiny focal extrusion
of the methylmethacrylate mixture from the anterior aspect of T12
of benign consequence.

There was no extravasation noted into the disc spaces, or
posteriorly into the spinal canal or into the epidural veins.  The
working cannulae and the bone fillers were then removed, and
hemostasis achieved at the skin entry sites at the three levels.

The patient tolerated the procedure well.

Medications utilized: Versed 3.5 mg IV.  Fentanyl 87.5 mcg IV.
IMPRESSION: 1.  Status post fluoroscopic-guided needle placement for deep core
bone biopsies at T11, T12 and L1.
2.  Status post vertebral body augmentation for painful compression
fractures at T11, T12 and L1 using the balloon kyphoplasty
technique.

## 2010-12-12 ENCOUNTER — Encounter (HOSPITAL_BASED_OUTPATIENT_CLINIC_OR_DEPARTMENT_OTHER): Payer: Medicare Other | Admitting: Oncology

## 2010-12-12 ENCOUNTER — Other Ambulatory Visit (HOSPITAL_COMMUNITY): Payer: Self-pay | Admitting: Oncology

## 2010-12-12 DIAGNOSIS — C9000 Multiple myeloma not having achieved remission: Secondary | ICD-10-CM

## 2010-12-12 DIAGNOSIS — R609 Edema, unspecified: Secondary | ICD-10-CM

## 2010-12-12 DIAGNOSIS — R197 Diarrhea, unspecified: Secondary | ICD-10-CM

## 2010-12-12 DIAGNOSIS — E559 Vitamin D deficiency, unspecified: Secondary | ICD-10-CM

## 2010-12-12 LAB — CBC WITH DIFFERENTIAL/PLATELET
Basophils Absolute: 0 10*3/uL (ref 0.0–0.1)
HCT: 43.6 % (ref 34.8–46.6)
HGB: 14.4 g/dL (ref 11.6–15.9)
MONO#: 0.3 10*3/uL (ref 0.1–0.9)
NEUT#: 4.6 10*3/uL (ref 1.5–6.5)
NEUT%: 77.7 % — ABNORMAL HIGH (ref 38.4–76.8)
RDW: 16 % — ABNORMAL HIGH (ref 11.2–14.5)
WBC: 5.9 10*3/uL (ref 3.9–10.3)
lymph#: 1 10*3/uL (ref 0.9–3.3)

## 2010-12-13 LAB — PROTEIN / CREATININE RATIO, URINE
Creatinine, Urine: 73.4 mg/dL
Protein Creatinine Ratio: 0.07 (ref <0.15)
Total Protein, Urine: 5 mg/dL

## 2011-01-04 IMAGING — CR DG CHEST 2V
2 series · 2 of 2 positions shown · non-contrast
Comparison: Chest x-ray of 09/26/2008

CLINICAL DATA: Follow up pneumonia, still very weak

CHEST - 2 VIEW

[view not recorded (1 of 2)]
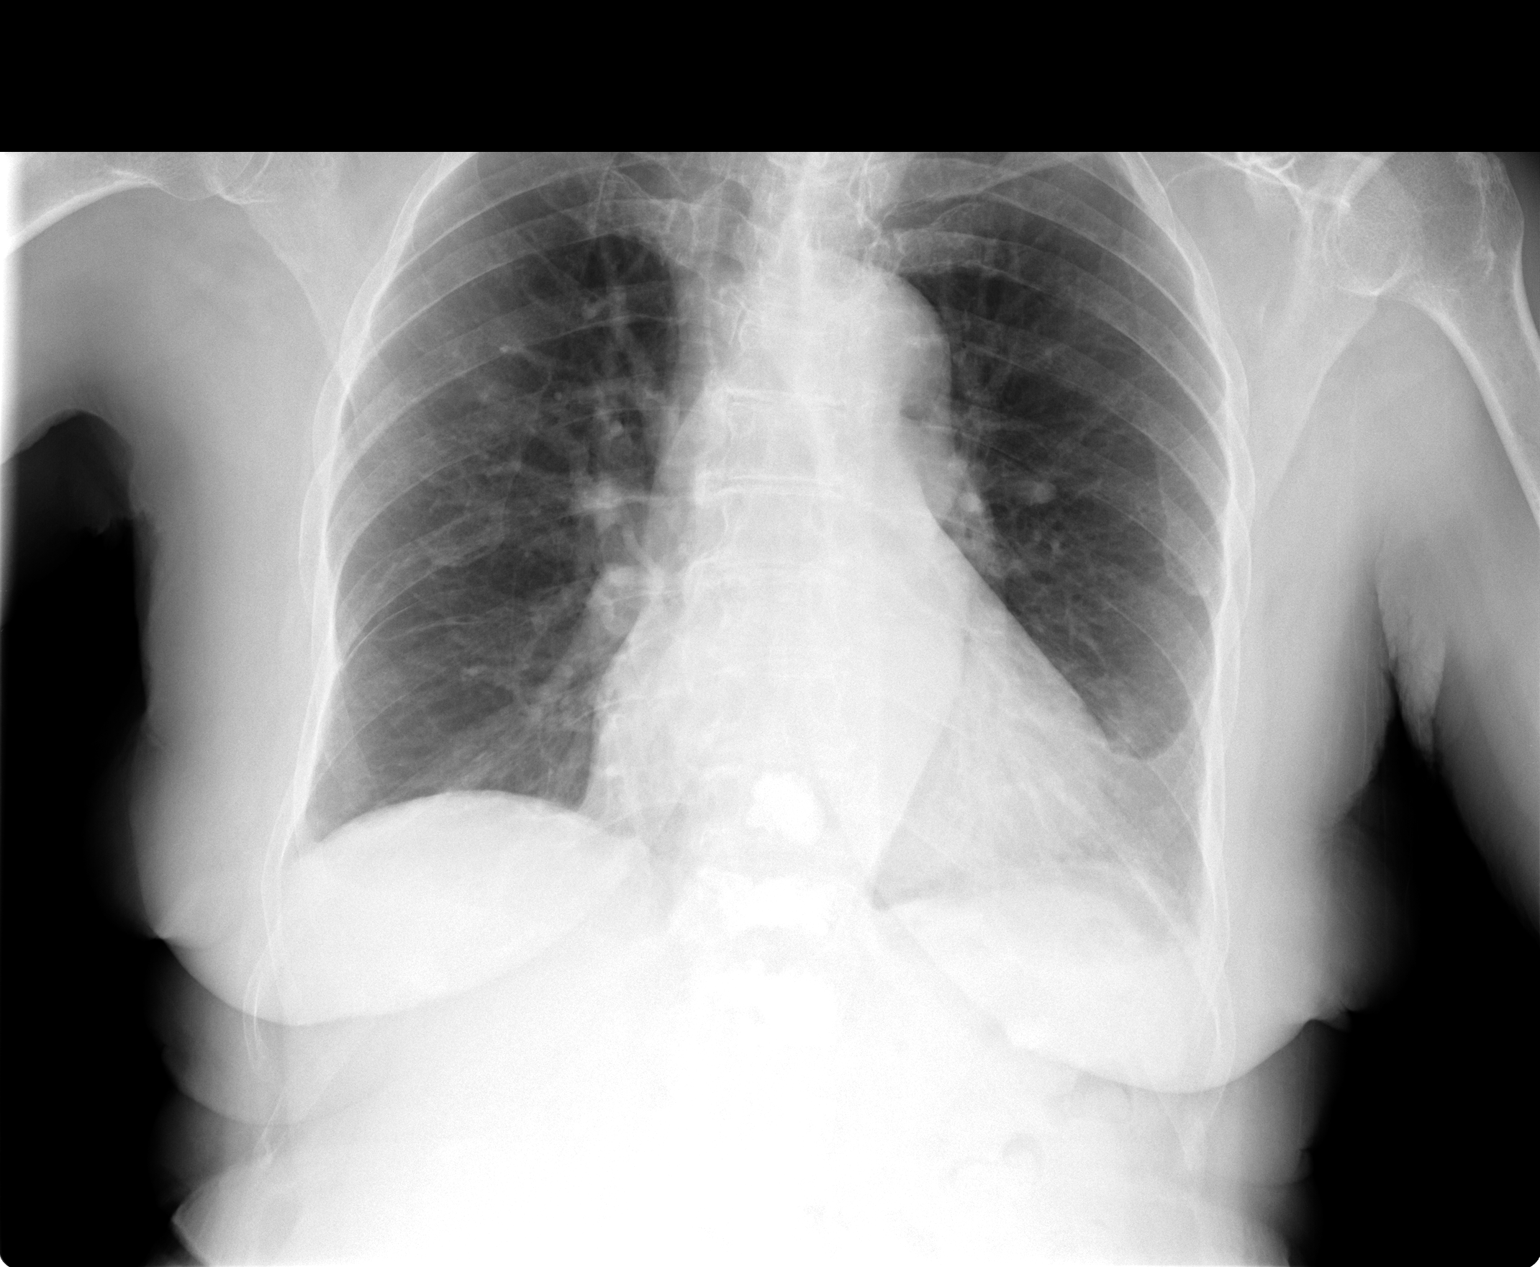

[view not recorded (2 of 2)]
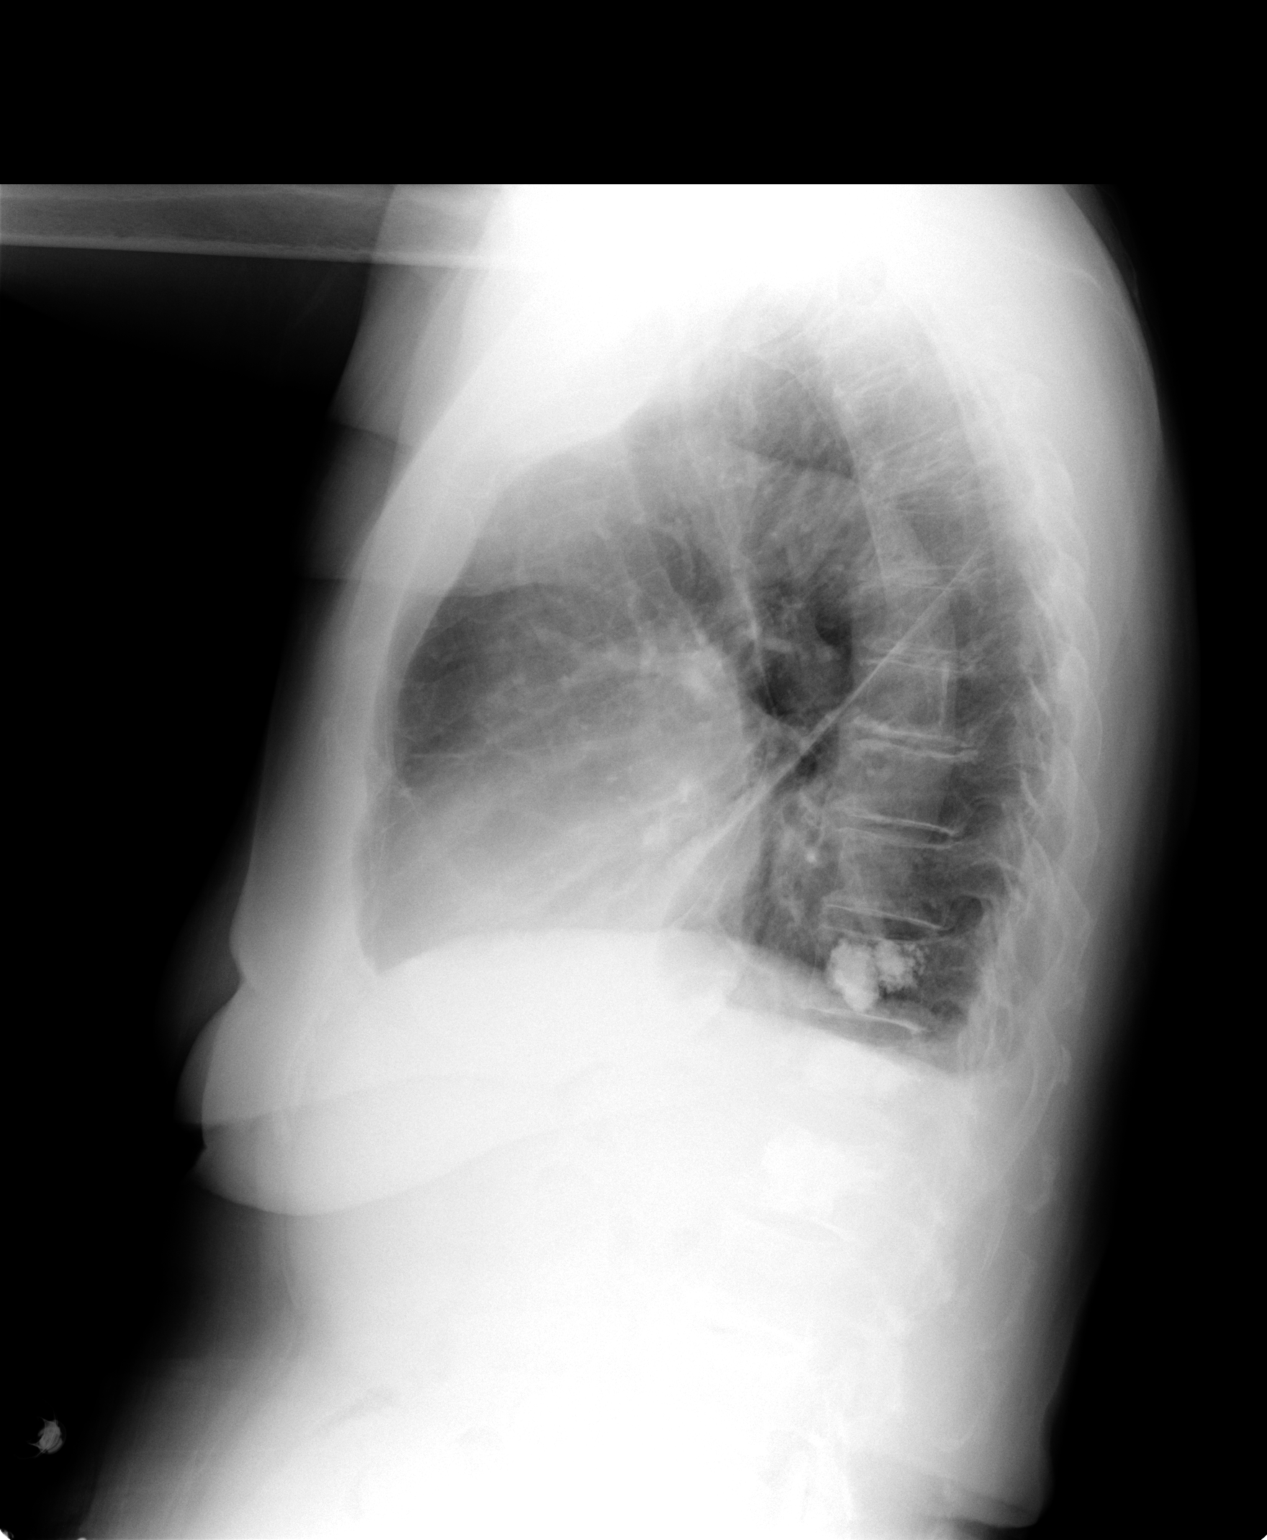

[2 of 2 positions shown; findings below may reference images not displayed]

FINDINGS: No definite pneumonia is seen.  Minimal blunting of the
left posterior costophrenic angle is unchanged which may represent
scarring or tiny effusion.  There may be a small amount of fluid in
the fissure with minimal pulmonary vascular congestion.
Cardiomegaly is stable.  The bones are osteopenic with multilevel
vertebroplasties noted.
IMPRESSION: No definite pneumonia.  There may be a small amount of fluid in the
fissure and possibly a tiny effusion with minimal pulmonary
vascular congestion.

## 2011-01-23 ENCOUNTER — Other Ambulatory Visit (HOSPITAL_COMMUNITY): Payer: Self-pay | Admitting: Oncology

## 2011-01-23 ENCOUNTER — Encounter (HOSPITAL_BASED_OUTPATIENT_CLINIC_OR_DEPARTMENT_OTHER): Payer: Medicare Other | Admitting: Oncology

## 2011-01-23 DIAGNOSIS — E559 Vitamin D deficiency, unspecified: Secondary | ICD-10-CM

## 2011-01-23 DIAGNOSIS — C9000 Multiple myeloma not having achieved remission: Secondary | ICD-10-CM

## 2011-01-23 DIAGNOSIS — R609 Edema, unspecified: Secondary | ICD-10-CM

## 2011-01-23 DIAGNOSIS — R197 Diarrhea, unspecified: Secondary | ICD-10-CM

## 2011-01-23 LAB — CBC WITH DIFFERENTIAL/PLATELET
BASO%: 0.5 % (ref 0.0–2.0)
Basophils Absolute: 0 10*3/uL (ref 0.0–0.1)
EOS%: 2.1 % (ref 0.0–7.0)
HCT: 44 % (ref 34.8–46.6)
HGB: 14.7 g/dL (ref 11.6–15.9)
MCH: 32.6 pg (ref 25.1–34.0)
MCHC: 33.4 g/dL (ref 31.5–36.0)
MONO#: 0.4 10*3/uL (ref 0.1–0.9)
NEUT%: 73.3 % (ref 38.4–76.8)
RDW: 15.9 % — ABNORMAL HIGH (ref 11.2–14.5)
WBC: 5.8 10*3/uL (ref 3.9–10.3)
lymph#: 1 10*3/uL (ref 0.9–3.3)

## 2011-01-23 LAB — COMPREHENSIVE METABOLIC PANEL
ALT: 15 U/L (ref 0–35)
AST: 18 U/L (ref 0–37)
Albumin: 3.3 g/dL — ABNORMAL LOW (ref 3.5–5.2)
CO2: 22 mEq/L (ref 19–32)
Calcium: 9.2 mg/dL (ref 8.4–10.5)
Chloride: 106 mEq/L (ref 96–112)
Potassium: 3.9 mEq/L (ref 3.5–5.3)

## 2011-01-23 LAB — LACTATE DEHYDROGENASE: LDH: 195 U/L (ref 94–250)

## 2011-01-24 LAB — PROTEIN / CREATININE RATIO, URINE: Creatinine, Urine: 84 mg/dL

## 2011-03-06 ENCOUNTER — Encounter (HOSPITAL_BASED_OUTPATIENT_CLINIC_OR_DEPARTMENT_OTHER): Payer: Medicare Other | Admitting: Oncology

## 2011-03-06 ENCOUNTER — Other Ambulatory Visit (HOSPITAL_COMMUNITY): Payer: Self-pay | Admitting: Oncology

## 2011-03-06 DIAGNOSIS — C9000 Multiple myeloma not having achieved remission: Secondary | ICD-10-CM

## 2011-03-06 DIAGNOSIS — Z452 Encounter for adjustment and management of vascular access device: Secondary | ICD-10-CM

## 2011-03-06 DIAGNOSIS — R609 Edema, unspecified: Secondary | ICD-10-CM

## 2011-03-06 DIAGNOSIS — R197 Diarrhea, unspecified: Secondary | ICD-10-CM

## 2011-03-06 DIAGNOSIS — E559 Vitamin D deficiency, unspecified: Secondary | ICD-10-CM

## 2011-03-06 LAB — CBC WITH DIFFERENTIAL/PLATELET
Eosinophils Absolute: 0 10*3/uL (ref 0.0–0.5)
LYMPH%: 12.3 % — ABNORMAL LOW (ref 14.0–49.7)
MONO#: 0.1 10*3/uL (ref 0.1–0.9)
NEUT#: 4.9 10*3/uL (ref 1.5–6.5)
Platelets: 177 10*3/uL (ref 145–400)
RBC: 4.31 10*6/uL (ref 3.70–5.45)
RDW: 18.2 % — ABNORMAL HIGH (ref 11.2–14.5)
WBC: 5.7 10*3/uL (ref 3.9–10.3)

## 2011-03-24 ENCOUNTER — Telehealth: Payer: Self-pay | Admitting: *Deleted

## 2011-03-24 NOTE — Telephone Encounter (Signed)
RECEIVED A FAX FROM [facility] CONCERNING A PRESCRIPTION REFILL REQUEST FOR REVLIMID. THIS REQUEST WAS GIVEN TO DR.MURINSON'S NURSE, KATHY BUYCK,RN. 

## 2011-03-25 ENCOUNTER — Telehealth: Payer: Self-pay | Admitting: Oncology

## 2011-03-25 ENCOUNTER — Other Ambulatory Visit: Payer: Self-pay | Admitting: *Deleted

## 2011-03-25 DIAGNOSIS — C9000 Multiple myeloma not having achieved remission: Secondary | ICD-10-CM

## 2011-03-25 MED ORDER — LENALIDOMIDE 10 MG PO CAPS: 10.0000 mg | ORAL_CAPSULE | Freq: Every day | ORAL | Status: DC

## 2011-03-25 NOTE — Telephone Encounter (Signed)
Pt's dtr called to r/s her mother's appts from 04/17/2011 due to she will not be in town that week. Pt's dtr request to r/s the appts to the following week.

## 2011-03-25 NOTE — Telephone Encounter (Signed)
PT. SURVEY COMPLETED ON 01/28/11. PHYSICIAN'S SURVEY COMPLETED ON 03/25/11. AUTHORIZATION #[id number].

## 2011-03-27 ENCOUNTER — Encounter: Payer: Self-pay | Admitting: *Deleted

## 2011-03-27 NOTE — Progress Notes (Signed)
RECEIVED A FAX FROM [facility] CONCERNING A CONFIRMATION OF PRESCRIPTION SHIPMENT. THIS NOTICE WAS GIVEN TO DR.MURINSON'S NURSE, KATHY BUYCK,RN.

## 2011-04-07 ENCOUNTER — Other Ambulatory Visit: Payer: Self-pay | Admitting: Cardiology

## 2011-04-08 ENCOUNTER — Other Ambulatory Visit: Payer: Self-pay | Admitting: *Deleted

## 2011-04-08 DIAGNOSIS — C9000 Multiple myeloma not having achieved remission: Secondary | ICD-10-CM

## 2011-04-08 MED ORDER — ONDANSETRON HCL 8 MG PO TABS: 8.0000 mg | ORAL_TABLET | Freq: Two times a day (BID) | ORAL | Status: AC | PRN

## 2011-04-17 ENCOUNTER — Ambulatory Visit: Payer: Medicare Other

## 2011-04-17 ENCOUNTER — Ambulatory Visit: Payer: Medicare Other | Admitting: Physician Assistant

## 2011-04-17 ENCOUNTER — Other Ambulatory Visit: Payer: Medicare Other | Admitting: Lab

## 2011-04-21 ENCOUNTER — Other Ambulatory Visit: Payer: Self-pay | Admitting: *Deleted

## 2011-04-21 NOTE — Telephone Encounter (Signed)
THIS REQUEST WAS GIVEN TO DR.MURINSON'S NURSE, ROBIN BASS,RN. 

## 2011-04-22 ENCOUNTER — Encounter: Payer: Self-pay | Admitting: *Deleted

## 2011-04-22 ENCOUNTER — Other Ambulatory Visit: Payer: Self-pay | Admitting: *Deleted

## 2011-04-22 DIAGNOSIS — C9000 Multiple myeloma not having achieved remission: Secondary | ICD-10-CM

## 2011-04-22 MED ORDER — LENALIDOMIDE 10 MG PO CAPS: 10.0000 mg | ORAL_CAPSULE | Freq: Every day | ORAL | Status: DC

## 2011-04-22 NOTE — Progress Notes (Signed)
RECEIVED A FAX FROM [facility] CONCERNING A CONFIRMATION OF FACSIMILE RECEIPT. THIS NOTICE WAS GIVEN TO DR. MURINSON'S NURSE, ROBIN BASS,RN. 

## 2011-04-23 NOTE — Telephone Encounter (Signed)
[facility] faxed confirmation of prescription shipment.  Revlimid was shipped on 04-22-11 with next business day delivery.

## 2011-04-25 ENCOUNTER — Other Ambulatory Visit (HOSPITAL_COMMUNITY): Payer: Self-pay | Admitting: Oncology

## 2011-04-25 ENCOUNTER — Ambulatory Visit (HOSPITAL_BASED_OUTPATIENT_CLINIC_OR_DEPARTMENT_OTHER): Payer: Medicare Other | Admitting: Oncology

## 2011-04-25 ENCOUNTER — Other Ambulatory Visit: Payer: Self-pay | Admitting: *Deleted

## 2011-04-25 ENCOUNTER — Ambulatory Visit: Payer: Medicare Other

## 2011-04-25 ENCOUNTER — Encounter: Payer: Self-pay | Admitting: Oncology

## 2011-04-25 ENCOUNTER — Other Ambulatory Visit (HOSPITAL_BASED_OUTPATIENT_CLINIC_OR_DEPARTMENT_OTHER): Payer: Medicare Other | Admitting: Lab

## 2011-04-25 DIAGNOSIS — E559 Vitamin D deficiency, unspecified: Secondary | ICD-10-CM | POA: Insufficient documentation

## 2011-04-25 DIAGNOSIS — C9000 Multiple myeloma not having achieved remission: Secondary | ICD-10-CM

## 2011-04-25 LAB — CBC WITH DIFFERENTIAL/PLATELET
Basophils Absolute: 0 10*3/uL (ref 0.0–0.1)
EOS%: 0 % (ref 0.0–7.0)
Eosinophils Absolute: 0 10*3/uL (ref 0.0–0.5)
HGB: 13.8 g/dL (ref 11.6–15.9)
MCH: 33.6 pg (ref 25.1–34.0)
MONO%: 15.4 % — ABNORMAL HIGH (ref 0.0–14.0)
NEUT#: 6.7 10*3/uL — ABNORMAL HIGH (ref 1.5–6.5)
RBC: 4.1 10*6/uL (ref 3.70–5.45)
RDW: 16.9 % — ABNORMAL HIGH (ref 11.2–14.5)
lymph#: 0.6 10*3/uL — ABNORMAL LOW (ref 0.9–3.3)

## 2011-04-25 LAB — PROTEIN / CREATININE RATIO, URINE
Creatinine, Urine: 185.6 mg/dL
Protein Creatinine Ratio: 0.11 (ref <0.15)
Total Protein, Urine: 21 mg/dL

## 2011-04-25 MED ORDER — HEPARIN SOD (PORK) LOCK FLUSH 100 UNIT/ML IV SOLN
500.0000 [IU] | Freq: Once | INTRAVENOUS | Status: AC
  Administered 2011-04-25: 500 [IU] via INTRAVENOUS
  Filled 2011-04-25: qty 5

## 2011-04-25 MED ORDER — OXYCODONE-ACETAMINOPHEN 5-325 MG PO TABS: 1.0000 | ORAL_TABLET | Freq: Two times a day (BID) | ORAL | Status: DC

## 2011-04-25 MED ORDER — SODIUM CHLORIDE 0.9 % IJ SOLN
10.0000 mL | INTRAMUSCULAR | Status: DC | PRN
  Administered 2011-04-25: 10 mL via INTRAVENOUS
  Filled 2011-04-25: qty 10

## 2011-04-25 NOTE — Progress Notes (Signed)
This office note has been dictated.  #[id number]

## 2011-04-25 NOTE — Progress Notes (Signed)
CC:   [name], M.D. [name]. [name], M.D.  HISTORY:  I am seeing Lindsey Dalton today for followup of her kappa light chain multiple myeloma, previously associated with a bone marrow that showed 51% plasma cells, urine protein excretion of 8 gm, and chromosomal abnormalities that were indicative of a poor prognosis. Diagnosis was made in April 2010.  Fortunately, Lindsey Dalton, Lindsey Dalton has done extraordinarily well.  She is here today.  She is with her granddaughter, Lindsey Dalton.  Lindsey Dalton was last seen by [place] on 01/23/2011.  She continues to do well on the current treatment program.  Revlimid 10 mg p.o. daily 3 weeks out of every 4, i.e. 3 weeks on 1 week off.  She also takes Decadron 8 mg weekly on Thursdays.  We had been treating the patient with Zometa 3 mg every 3 months.  These treatments were started on 09/15/2008, and the patient last received Zometa on 11/01/2010.  Zometa was held in September.  We had learned that the patient had some dental extractions.  Lindsey Dalton in general seems to be doing well although apparently there have been some dental problems.  I wrote down on a piece of paper the drug Zometa and requested that the dentist may want to call me.  I also gave contact information.  It is well known that Zometa cause issues with osteonecrosis of the mandible.  The patient may be having similar difficulties.  We continue to maintain her Port-A-Cath with heparin flushes.  I believe this was last flushed back in late October around the 25th.  The patient continues to have some back pain for which she takes oxycodone with acetaminophen.  She takes 1 or 2 a day.  We are refilling this prescription for her with 60 pills, no refills.  The patient's main problem is dyspnea on exertion which she attributes to congestive heart failure.  She has an appointment to see Dr. [name] later today.  PROBLEM LIST: 1. Kappa light chain multiple myeloma diagnosed in April 2010  with 51%     plasma cells in the marrow.  Kappa light chains evident on serum     and urine immunofixation electrophoresis and 8.3 gm of protein in     the urine.  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  The patient initially was treated     with Velcade and Decadron from mid-May 2010 through mid-May 2011     with an excellent response.  She was also receiving Zometa     beginning on Sep 15, 2008.  The patient also was receiving Revlimid     starting in June 2010 and has continued on Revlimid and Decadron     since that time in a program as described above.  She has had an     excellent response to treatment.  Last metastatic bone survey was     on 09/05/2010 and showed no significant changes when compared with     the prior study of 07/20/2009.  There were few lytic lesions, but     no evidence for progression.  The patient's last 24-hour urine     collection was in April 2011 and revealed 78 mg of protein.  We     have not repeated the bone marrow.  Clinically, she seems to be     doing quite well with her myeloma. 2. History of congestive heart failure with coronary artery disease  status post cardiac catheterizations in 2009. 3. History of diverticulosis and lower GI bleed in May 2010. 4. History of multiple compression fractures with balloon kyphoplasty     involving T11, T12, L1, on 08/17/2008. 5. Hypertension. 6. Diabetes mellitus. 7. Dyslipidemia. 8. Hiatal hernia. 9. Severe degenerative joint disease. 10.Vitamin D deficiency.  MEDICINES:  Allopurinol 200 mg daily, amoxicillin which the patient is taking for her dental problems prescribed by her dentist, aspirin 81 mg daily, atenolol, calcium citrate and vitamin D 1000 units daily, ferrous gluconate (Fergon), Mucinex, Slow-Mag, Prilosec, Zofran as needed, Percocet 5/325 as needed, Phenergan as needed, Zocor, Restoril as needed.  PHYSICAL EXAM:  General:  Lindsey Dalton  looks fairly well.  She is 77 years old, somewhat frail, and needs help getting up to the examining table.  Weight is 163 pounds 9.6 ounces without significant change from previous values.  Height 5 feet 2 inches, body surface area 1.8 sq m. Vital Signs:  Blood pressure 163/95.  The patient was informed of her slightly elevated blood pressure.  O2 saturation on room air at rest was 98%.  Other vital signs are normal.  HEENT:  There is no scleral icterus.  Mouth and pharynx notable for an upper plate.  The patient has had all of her lower teeth removed.  I did not see any protruding bone or signs of osteonecrosis.  No adenopathy palpable.  Breasts:  Not examined.  The patient has not had a mammogram in 2 years.  I suggested she may want to consider mammogram.  There is a right-sided Port-A-Cath which was flushed with heparin today.  Lungs:  There are a few rales at the right base, otherwise lungs were clear.  Cardiac Examination: Regular rhythm without murmur or rub.  Abdomen:  Benign with no organomegaly or masses palpable.  Extremities:  Puffy and somewhat tender with minimal pitting.  LABORATORY DATA:  Today, white count 8.8, ANC 6.7, hemoglobin 13.8, hematocrit 41.6, and platelets 243,000.  Chemistries:  Urine protein to creatinine ratio and vitamin D level are pending.  Chemistries from 01/23/2011 notable for an albumin of 3.3, total protein 5.3, and thus globulins were 2.0.  Liver function tests and LDH were normal.  BUN 8, creatinine 0.65, calcium was 9.2.  In the past, the patient did present with hypercalcemia.  The urine protein to creatinine ratio today was 0.11 with normal being less than 0.15.  On 01/23/2011 the ratio was 0.06.  Vitamin D level on 01/23/2011 was 38.  IMPRESSION AND PLAN:  All things considered, Lindsey Dalton. Dalton is doing quite well.  She seems to be tolerating her current treatment with Revlimid and Decadron quite well.  We are going to hold Zometa in view of  the patient's dental problems.  In addition, looking through her chart, I see where she received 2 years of Zometa.  We will discontinue Zometa for now.  The patient tells me she is due to restart her Revlimid on Sunday, December 16th.  She has been off this week.  As stated, she continues to take Decadron 8 mg every Thursday.  Her Revlimid dose is 10 mg.  Her Port-A-Cath was flushed with heparin today.  We will have her come back in 6 weeks at which time we will check another CBC and flush her Port-A-Cath again.  Apparently, there has been some difficulty getting blood from this.  We are also going to have the patient collect another 24-hour urine for total protein, urine immunofixation electrophoresis, and we will  be checking at her next visit, which should be in about 3 months, another urine protein to creatinine ratio, vitamin D level, CBC, and chemistries.  The patient tells me that she had a flu shot in mid-November 2012.  She will be due for Port-A-Cath flush in about 6 weeks and again when we see her in 3 months.  Thus far, the patient continues to do extraordinarily well.    ______________________________ Samul Dada, M.D. DSM/MEDQ  D:  04/25/2011  T:  04/25/2011  Job:  [id number]

## 2011-04-26 LAB — COMPREHENSIVE METABOLIC PANEL
ALT: 20 U/L (ref 0–35)
AST: 17 U/L (ref 0–37)
Alkaline Phosphatase: 50 U/L (ref 39–117)
CO2: 24 mEq/L (ref 19–32)
Creatinine, Ser: 0.74 mg/dL (ref 0.50–1.10)
Sodium: 140 mEq/L (ref 135–145)
Total Bilirubin: 0.4 mg/dL (ref 0.3–1.2)
Total Protein: 5 g/dL — ABNORMAL LOW (ref 6.0–8.3)

## 2011-05-17 ENCOUNTER — Other Ambulatory Visit: Payer: Self-pay | Admitting: Cardiology

## 2011-05-19 ENCOUNTER — Other Ambulatory Visit: Payer: Self-pay | Admitting: *Deleted

## 2011-05-19 ENCOUNTER — Other Ambulatory Visit: Payer: Self-pay | Admitting: Medical Oncology

## 2011-05-19 ENCOUNTER — Encounter: Payer: Self-pay | Admitting: *Deleted

## 2011-05-19 DIAGNOSIS — C9000 Multiple myeloma not having achieved remission: Secondary | ICD-10-CM

## 2011-05-19 MED ORDER — LENALIDOMIDE 10 MG PO CAPS: 10.0000 mg | ORAL_CAPSULE | Freq: Every day | ORAL | Status: DC

## 2011-05-19 MED ORDER — ALLOPURINOL 100 MG PO TABS: 200.0000 mg | ORAL_TABLET | Freq: Every day | ORAL | Status: DC

## 2011-05-19 NOTE — Progress Notes (Signed)
RECEIVED A FAX FROM [facility] CONCERNING A CONFIRMATION OF FACSIMILE RECEIPT. THIS NOTICE WAS GIVEN TO DR. Mamie Levers NURSE, ROBIN BASS,RN.

## 2011-05-19 NOTE — Telephone Encounter (Signed)
PT.'S SURVEY COMPLETED ON 01/28/11. PHYSICIAN'S SURVEY COMPLETED ON 05/19/11. AUTHORIZATION #[id number]

## 2011-05-19 NOTE — Telephone Encounter (Signed)
THIS REQUEST WAS GIVEN TO DR.MURINSON'S NURSE, ROBIN BASS,RN. 

## 2011-05-21 NOTE — Progress Notes (Signed)
[facility] faxed confirmation of Revlimid prescription shipment.  Revlimid shipped 05-20-2011 with next business day delivery.

## 2011-06-06 ENCOUNTER — Other Ambulatory Visit: Payer: Medicare Other | Admitting: Lab

## 2011-06-06 ENCOUNTER — Telehealth: Payer: Self-pay | Admitting: Oncology

## 2011-06-06 NOTE — Telephone Encounter (Signed)
flush nurse had called pt and she chose to r.s due to weather,appt made for 06/11/11   aom

## 2011-06-11 ENCOUNTER — Other Ambulatory Visit: Payer: Self-pay | Admitting: Medical Oncology

## 2011-06-11 ENCOUNTER — Other Ambulatory Visit: Payer: Medicare Other | Admitting: Lab

## 2011-06-11 ENCOUNTER — Ambulatory Visit (HOSPITAL_BASED_OUTPATIENT_CLINIC_OR_DEPARTMENT_OTHER): Payer: Medicare Other

## 2011-06-11 VITALS — BP 116/71 | HR 65 | Temp 99.2°F

## 2011-06-11 DIAGNOSIS — C439 Malignant melanoma of skin, unspecified: Secondary | ICD-10-CM

## 2011-06-11 DIAGNOSIS — C9000 Multiple myeloma not having achieved remission: Secondary | ICD-10-CM

## 2011-06-11 LAB — CBC WITH DIFFERENTIAL/PLATELET
Eosinophils Absolute: 0.4 10*3/uL (ref 0.0–0.5)
HCT: 41 % (ref 34.8–46.6)
LYMPH%: 29.2 % (ref 14.0–49.7)
MCHC: 33.3 g/dL (ref 31.5–36.0)
MONO#: 0.9 10*3/uL (ref 0.1–0.9)
NEUT#: 2.3 10*3/uL (ref 1.5–6.5)
NEUT%: 45.4 % (ref 38.4–76.8)
Platelets: 163 10*3/uL (ref 145–400)
WBC: 5.2 10*3/uL (ref 3.9–10.3)

## 2011-06-11 MED ORDER — SODIUM CHLORIDE 0.9 % IJ SOLN
10.0000 mL | INTRAMUSCULAR | Status: DC | PRN
  Administered 2011-06-11: 10 mL via INTRAVENOUS
  Filled 2011-06-11: qty 10

## 2011-06-11 MED ORDER — DEXAMETHASONE 4 MG PO TABS: 4.0000 mg | ORAL_TABLET | ORAL | Status: DC

## 2011-06-11 MED ORDER — HEPARIN SOD (PORK) LOCK FLUSH 100 UNIT/ML IV SOLN
500.0000 [IU] | Freq: Once | INTRAVENOUS | Status: AC
  Administered 2011-06-11: 500 [IU] via INTRAVENOUS
  Filled 2011-06-11: qty 5

## 2011-06-11 MED ORDER — OXYCODONE-ACETAMINOPHEN 5-325 MG PO TABS: 1.0000 | ORAL_TABLET | Freq: Two times a day (BID) | ORAL | Status: DC

## 2011-06-12 ENCOUNTER — Other Ambulatory Visit: Payer: Self-pay | Admitting: Medical Oncology

## 2011-06-12 MED ORDER — DEXAMETHASONE 4 MG PO TABS: 4.0000 mg | ORAL_TABLET | ORAL | Status: AC

## 2011-06-16 ENCOUNTER — Other Ambulatory Visit: Payer: Self-pay | Admitting: *Deleted

## 2011-06-16 DIAGNOSIS — C9000 Multiple myeloma not having achieved remission: Secondary | ICD-10-CM

## 2011-06-16 NOTE — Telephone Encounter (Addendum)
THIS REQUEST WAS GIVEN TO DR.MURINSON'S NURSE, ROBIN BASS,RN.                                                                                                          06/17/11 PT. SURVEY COMPLETED ON 01/28/11. PHYSICIAN'S SURVEY COMPLETED ON 06/17/11. AUTHORIZATION #[id number]

## 2011-06-17 MED ORDER — LENALIDOMIDE 10 MG PO CAPS: 10.0000 mg | ORAL_CAPSULE | Freq: Every day | ORAL | Status: DC

## 2011-06-17 NOTE — Telephone Encounter (Signed)
Addended by: Arvilla Meres on: 06/17/2011 11:04 AM   Modules accepted: Orders

## 2011-06-18 NOTE — Telephone Encounter (Signed)
[facility] faxed confirmation of prescription shipment.  Shipped Revlimid on 06-17-11 with next business day delivery.

## 2011-06-19 ENCOUNTER — Encounter: Payer: Self-pay | Admitting: *Deleted

## 2011-06-19 NOTE — Progress Notes (Signed)
ON 06/17/11 RECEIVED A FAX FROM [facility] CONCERNING A CONFIRMATION OF FACSIMILE RECEIPT.

## 2011-07-11 ENCOUNTER — Other Ambulatory Visit: Payer: Self-pay | Admitting: Medical Oncology

## 2011-07-11 MED ORDER — TEMAZEPAM 30 MG PO CAPS: 30.0000 mg | ORAL_CAPSULE | Freq: Every evening | ORAL | Status: DC | PRN

## 2011-07-11 MED ORDER — OXYCODONE-ACETAMINOPHEN 5-325 MG PO TABS: 1.0000 | ORAL_TABLET | Freq: Two times a day (BID) | ORAL | Status: DC

## 2011-07-14 ENCOUNTER — Other Ambulatory Visit: Payer: Self-pay | Admitting: *Deleted

## 2011-07-14 DIAGNOSIS — C9000 Multiple myeloma not having achieved remission: Secondary | ICD-10-CM

## 2011-07-14 MED ORDER — LENALIDOMIDE 10 MG PO CAPS: 10.0000 mg | ORAL_CAPSULE | Freq: Every day | ORAL | Status: DC

## 2011-07-14 NOTE — Telephone Encounter (Signed)
PRESCRIPTION REFILL COMPLETED. 

## 2011-07-14 NOTE — Telephone Encounter (Signed)
THIS REQUEST WAS GIVEN TO DR.MURINSON'S NURSE, ROBIN BASS,RN. 

## 2011-07-14 NOTE — Telephone Encounter (Signed)
Addended by: Arvilla Meres on: 07/14/2011 03:36 PM   Modules accepted: Orders

## 2011-07-16 NOTE — Telephone Encounter (Signed)
On 07-14-11 [facility] faxed confirmation of Revlimid facsimile receipt.

## 2011-07-16 NOTE — Telephone Encounter (Signed)
On 07-15-11, [facility] faxed confirmation of prescription shipment.  Shipped revlimid on 07-15-11 with next business day delivery.

## 2011-07-24 ENCOUNTER — Ambulatory Visit (HOSPITAL_BASED_OUTPATIENT_CLINIC_OR_DEPARTMENT_OTHER): Payer: Medicare Other | Admitting: Oncology

## 2011-07-24 ENCOUNTER — Telehealth: Payer: Self-pay | Admitting: Oncology

## 2011-07-24 ENCOUNTER — Other Ambulatory Visit (HOSPITAL_BASED_OUTPATIENT_CLINIC_OR_DEPARTMENT_OTHER): Payer: Medicare Other | Admitting: Lab

## 2011-07-24 ENCOUNTER — Encounter: Payer: Self-pay | Admitting: Oncology

## 2011-07-24 DIAGNOSIS — C9000 Multiple myeloma not having achieved remission: Secondary | ICD-10-CM

## 2011-07-24 DIAGNOSIS — M8708 Idiopathic aseptic necrosis of bone, other site: Secondary | ICD-10-CM

## 2011-07-24 DIAGNOSIS — E559 Vitamin D deficiency, unspecified: Secondary | ICD-10-CM

## 2011-07-24 DIAGNOSIS — Z452 Encounter for adjustment and management of vascular access device: Secondary | ICD-10-CM

## 2011-07-24 LAB — CBC WITH DIFFERENTIAL/PLATELET
Eosinophils Absolute: 0.1 10*3/uL (ref 0.0–0.5)
HCT: 47.4 % — ABNORMAL HIGH (ref 34.8–46.6)
LYMPH%: 18.1 % (ref 14.0–49.7)
MCHC: 33.3 g/dL (ref 31.5–36.0)
MCV: 102.8 fL — ABNORMAL HIGH (ref 79.5–101.0)
MONO#: 0.1 10*3/uL (ref 0.1–0.9)
MONO%: 1.8 % (ref 0.0–14.0)
NEUT#: 5.8 10*3/uL (ref 1.5–6.5)
NEUT%: 79.1 % — ABNORMAL HIGH (ref 38.4–76.8)
Platelets: 183 10*3/uL (ref 145–400)
WBC: 7.3 10*3/uL (ref 3.9–10.3)

## 2011-07-24 MED ORDER — HEPARIN SOD (PORK) LOCK FLUSH 100 UNIT/ML IV SOLN
500.0000 [IU] | Freq: Once | INTRAVENOUS | Status: AC
Start: 2011-07-24 — End: 2011-07-24
  Administered 2011-07-24: 500 [IU] via INTRAVENOUS
  Filled 2011-07-24: qty 5

## 2011-07-24 MED ORDER — SODIUM CHLORIDE 0.9 % IJ SOLN
10.0000 mL | Freq: Once | INTRAMUSCULAR | Status: AC
  Administered 2011-07-24: 10 mL via INTRAVENOUS
  Filled 2011-07-24: qty 10

## 2011-07-24 NOTE — Progress Notes (Signed)
CC:   Lindsey Dalton, M.D. Lindsey Dalton. Lindsey Dalton, M.D.   PROBLEM LIST:  1. Kappa light chain multiple myeloma diagnosed in April 2010 with 51%  plasma cells in the marrow. Kappa light chains evident on serum  and urine immunofixation electrophoresis and 8.3 gm of protein in  the urine. FISH studies showed 13q minus and 11:14 translocation  indicative of a poor prognosis. The patient initially was treated  with Velcade and Decadron from mid-May 2010 through mid-May 2011  with an excellent response. She also received Zometa from  09/15/08 to 11/01/10. The patient also has been receiving Revlimid since June 2010 and has continued on Revlimid and Decadron  since that time in a program as described above. She has had an  excellent response to treatment. Last metastatic bone survey was  on 09/05/2010 and showed no significant changes when compared with  the prior study of 07/20/2009. There were few lytic lesions, but  no evidence for progression. The patient's last 24-hour urine  collection was in April 2011 and revealed 78 mg of protein. We  have not repeated the bone marrow. Clinically, she seems to be  doing quite well with her myeloma.  2. History of congestive heart failure with coronary artery disease  status post cardiac catheterizations in 2009.  3. History of diverticulosis and lower GI bleed in May 2010.  4. History of multiple compression fractures with balloon kyphoplasty  involving T11, T12, L1, on 08/17/2008.  5. Hypertension.  6. Diabetes mellitus.  7. Dyslipidemia.  8. Hiatal hernia.  9. Severe degenerative joint disease.  10.Vitamin D deficiency. 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 12.Right-sided Port-A-Cath placed on 04/09/2009.  MEDICATIONS: 1. Allopurinol 200 mg daily. 2. Aspirin 81 mg daily. 3. Atenolol 50 mg daily. 4. Citracal Plus D 1 tablet twice daily. 5. Vitamin D 1000 units daily. 6. Decadron 8 mg weekly on  Thursday. 7. Ferrous gluconate (Fergon) 246 mg twice daily. 8. Mucinex 1200 mg as needed. 9. Revlimid 10 mg daily 3 weeks out of every 4 weeks. 10.Slow-Mag 1 tablet twice daily. 11.Prilosec 20 mg daily as needed. 12.Zofran 8 mg every 12 hours as needed. 13.OxyIR with acetaminophen 5/325 one tablet twice daily. 14.K-Dur 10 mEq 2 tablets twice daily. 15.Phenergan 25 mg tablet as needed. 16.Zocor 40 mg at bedtime. 17.Restoril 30 mg at bedtime.  Pneumovax was given in May 2010. Flu shot was given on 03/25/2011.  HISTORY:  Lindsey Dalton who is now 78 years old is here today for followup of her kappa light chain multiple myeloma.  She is accompanied by her granddaughter, Lindsey Dalton.  Lindsey Dalton was last seen by [place] on 04/25/2011.  Her main problem appears to be osteonecrosis of the jaw following dental extractions in September.  The patient had received Zometa at the beginning on 09/15/2008.  Her last dose of Zometa was on November 01, 2010.  I spoke with the patient's dentist today, Dr. Lorenza Burton.  Dr. Clare Charon will be seeing the patient again next week.  I suggested that if there are continued problems the patient may need to be evaluated at [facility] and may require hyperbaric oxygen for healing.  Lindsey Dalton' major problems have to do with some diarrhea which she thinks may have to do with the Revlimid, ongoing back discomfort for which she takes oxycodone with Tylenol, and some difficulty sleeping despite Restoril.  We may be trying some Ativan.  Aside  from this, Lindsey Dalton seems to be doing quite well. She was here on January 30th for CBC and had her Port-A-Cath flushed with heparin at that time.  The patient is having some difficulty chewing.  There has been some loss of appetite.  There has also been some loss of weight, approximately 5-6 pounds.  PHYSICAL EXAM:  General:  Lindsey Dalton seems to be doing fairly well for age, no obvious  changes.  Weight is 155.6 pounds, height 5 feet 2 inches, body surface area 1.76 sq m.  Blood pressure 142/87.  Other vital signs are normal.  HEENT:  There is no scleral icterus.  Mouth and pharynx:  She is edentulous.  I could not see any protruding bone.  I could not see any ulcerations or thrush.  No adenopathy palpable. Lungs:  There are some inspiratory rales at the bases.  Cardiac: Regular rhythm without murmur or rub.  The patient does have a right- sided Port-A-Cath, which will be flushed with heparin today.  The port was covered with EMLA cream.  Abdomen:  Benign with no organomegaly or masses palpable.  Extremities:  Puffy ankles with pitting and tenderness.  Neurologic:  Nonfocal.  Spine:  Marked dorsal kyphosis.  LABORATORY DATA:  Today, white count 7.3, ANC 5.8, hemoglobin 15.8, hematocrit 47.4, platelets 183,000.  Chemistries, vitamin D level, and a urine protein to creatinine ratio are pending.  Chemistries from 04/25/2011 were normal except for a glucose of 119.  Urine protein to creatinine ratio on 04/25/2011 was 0.11.  Vitamin D level on 12/14 was 41.  IMAGING STUDIES:  1. Metastatic bone survey carried out on 07/20/2009 showed osteopenia.  There were some small lucent lesions within the skull consistent with a either venous lakes or multiple myeloma deposits.  There were numerous wedge compression fractures and degenerative changes throughout the spine.  There was a wedge compression of L3 of indeterminate age.  2. Chest x-ray, 2-view, from 07/25/2010 showed no active cardiopulmonary disease.  3. Metastatic bone survey from 09/05/2010 showed unchanged osseous survey with a few lytic lesions that were seen in the skull, possibly the right femur at the junction of the middle and distal thirds.  There were compression fractures of the thoracic and lumbar spine unchanged.  The patient is status post vertebroplasty at T11, T12, and L1.  A small lytic lesion was seen  in the scapula.  There was a degenerative cyst in the greater tuberosity of the left humerus.  IMPRESSION AND PLAN:  Lindsey Dalton seems to be doing fairly well from the standpoint of her multiple myeloma.  I am concerned about the osteonecrosis of her jaw.  I spoke with her dentist today.  The patient may need further evaluation and treatment, possibly consultation at [facility], and possibly hyperbaric oxygen if her jaw problems do not improve.  She is having some soreness and trouble eating.  The Port-A-Cath was flushed with heparin today.  We will go ahead with another metastatic bone survey.  We are also going to go ahead with a 24- hour urine collection for protein and a urine immunofixation electrophoresis.  I believe I had ordered this for December 2012, i.e. 3 months ago but this did not get carried out.  We will check CBC and carry out a Port-A-Cath flush in 2 months.  Mrs. Minetti will return in 4 months at which time, she will again need another Port-A-Cath flush.  We will check CBC, chemistries, vitamin D level, and a  urine protein to creatinine ratio.  The patient will continue with Revlimid 10 mg daily 3 weeks on 1 week off.  The patient started her most recent round of Revlimid on March 10th.  She will continue with Decadron 8 mg weekly on Thursday.  We have stopped the Zometa.  The patient may not require the use of the Port-A- Cath but we will continue to maintain this for now.    ______________________________ Samul Dada, M.D. DSM/MEDQ  D:  07/24/2011  T:  07/24/2011  Job:  [id number]

## 2011-07-24 NOTE — Telephone Encounter (Signed)
Gave pt appt for March, April,May and scan for 07/28/11

## 2011-07-24 NOTE — Progress Notes (Signed)
This office note has been dictated.  #[id number]

## 2011-07-25 LAB — COMPREHENSIVE METABOLIC PANEL
ALT: 22 U/L (ref 0–35)
Alkaline Phosphatase: 69 U/L (ref 39–117)
CO2: 28 mEq/L (ref 19–32)
Creatinine, Ser: 1 mg/dL (ref 0.50–1.10)
Sodium: 142 mEq/L (ref 135–145)
Total Bilirubin: 0.4 mg/dL (ref 0.3–1.2)
Total Protein: 5.4 g/dL — ABNORMAL LOW (ref 6.0–8.3)

## 2011-07-25 LAB — VITAMIN D 25 HYDROXY (VIT D DEFICIENCY, FRACTURES): Vit D, 25-Hydroxy: 46 ng/mL (ref 30–89)

## 2011-07-25 LAB — PROTEIN / CREATININE RATIO, URINE: Protein Creatinine Ratio: 0.05 (ref <0.15)

## 2011-07-28 ENCOUNTER — Other Ambulatory Visit: Payer: Self-pay | Admitting: Medical Oncology

## 2011-07-28 ENCOUNTER — Other Ambulatory Visit: Payer: Self-pay | Admitting: Oncology

## 2011-07-28 ENCOUNTER — Encounter: Payer: Self-pay | Admitting: Medical Oncology

## 2011-07-28 ENCOUNTER — Ambulatory Visit (HOSPITAL_COMMUNITY)
Admission: RE | Admit: 2011-07-28 | Discharge: 2011-07-28 | Disposition: A | Discharge status: Home / Self Care | Payer: Medicare Other | Source: Ambulatory Visit | Attending: Oncology | Admitting: Oncology

## 2011-07-28 ENCOUNTER — Ambulatory Visit: Payer: Medicare Other

## 2011-07-28 DIAGNOSIS — C9000 Multiple myeloma not having achieved remission: Secondary | ICD-10-CM

## 2011-07-28 DIAGNOSIS — M899 Disorder of bone, unspecified: Secondary | ICD-10-CM | POA: Insufficient documentation

## 2011-07-28 DIAGNOSIS — M949 Disorder of cartilage, unspecified: Secondary | ICD-10-CM | POA: Insufficient documentation

## 2011-07-28 MED ORDER — LORAZEPAM 1 MG PO TABS: 1.0000 mg | ORAL_TABLET | Freq: Every evening | ORAL | Status: DC | PRN

## 2011-07-29 ENCOUNTER — Encounter: Payer: Medicare Other | Admitting: Nutrition

## 2011-07-30 LAB — UIFE/LIGHT CHAINS/TP QN, 24-HR UR
Free Lambda Lt Chains,Ur: 0.1 mg/dL (ref 0.02–0.67)
Free Lt Chn Excr Rate: 40.11 mg/d
Volume, Urine: 2100 mL

## 2011-08-04 ENCOUNTER — Encounter: Payer: Self-pay | Admitting: Oncology

## 2011-08-04 NOTE — Progress Notes (Signed)
24 hr urinary protein from 07/28/11 was 53 mg, compared with 78 mg on 08/15/09 and 293 on 01/05/09.

## 2011-08-06 ENCOUNTER — Encounter: Payer: Self-pay | Admitting: *Deleted

## 2011-08-06 NOTE — Progress Notes (Signed)
[facility] faxed revlimid refill request.  Request to MD for review. 

## 2011-08-07 ENCOUNTER — Other Ambulatory Visit: Payer: Self-pay

## 2011-08-07 DIAGNOSIS — C9000 Multiple myeloma not having achieved remission: Secondary | ICD-10-CM

## 2011-08-07 MED ORDER — LENALIDOMIDE 10 MG PO CAPS: ORAL_CAPSULE | ORAL | Status: DC

## 2011-08-08 ENCOUNTER — Other Ambulatory Visit: Payer: Self-pay | Admitting: Medical Oncology

## 2011-08-08 MED ORDER — OXYCODONE-ACETAMINOPHEN 5-325 MG PO TABS: 1.0000 | ORAL_TABLET | Freq: Two times a day (BID) | ORAL | Status: DC

## 2011-08-20 ENCOUNTER — Ambulatory Visit: Payer: Medicare Other | Admitting: Nutrition

## 2011-08-21 NOTE — Assessment & Plan Note (Signed)
MEDICAL DIAGNOSIS:  Lindsey Dalton is an 78 year old female patient of Dr. Arline Asp, diagnosed with multiple myeloma.  MEDICAL HISTORY INCLUDES:  CHF with CAD, diverticulosis, hypertension, diabetes, hiatal hernia,  osteonecrosis of mandible and osteopenia.  MEDICATIONS INCLUDE:  Revlamid, Decadron, vitamin D, iron, magnesium chloride, Zofran, K-Dur, Phenergan, Zocor, and Restoril.  LABS:  Glucose of 123.  Vitamin D 46.  HEIGHT:  62 inches. WEIGHT:  161.8 pounds. USUAL BODY WEIGHT:  163 pounds documented in December of 2012. BMI:  29.5.  REASON FOR ASSESSMENT:  Patient and granddaughter present to Nutrition Consult. Patient had a positive nutrition risk screening.  She reports some difficulty chewing secondary to dental extractions.  She has a poor appetite and taste changes.  The patient reports that she does consume oral nutrition supplements when she can afford them.  She does not enjoy food the way she used to, therefore does not consume the same types of foods that she used to eat.  NUTRITION DIAGNOSIS:  Food and nutrition related knowledge deficit related to diagnosis of multiple myeloma, as evidenced by difficulty eating secondary to poor appetite and taste alterations.  INTERVENTION:  I have educated the patient and granddaughter on strategies for increasing oral intake, consuming adequate calories and protein throughout the day.  I have encouraged her to continue to use oral nutritional supplements as tolerated and I have provided some additional samples of these for her today.  I have also educated her on strategies for helping with her taste alteration.  I have given her some fact sheets she can refer to when she gets home and I have answered her questions.  MONITORING/EVALUATION (GOALS):  That patient will tolerate increased oral intake to promote weight maintenance.  NEXT VISIT:  Patient agrees to call me with questions or concerns.    ______________________________ Zenovia Jarred, RD, LDN Clinical Nutrition Specialist BN/MEDQ  D:  08/20/2011  T:  08/21/2011  Job:  [id number]

## 2011-09-09 ENCOUNTER — Other Ambulatory Visit: Payer: Self-pay | Admitting: *Deleted

## 2011-09-09 DIAGNOSIS — C9000 Multiple myeloma not having achieved remission: Secondary | ICD-10-CM

## 2011-09-09 MED ORDER — LENALIDOMIDE 10 MG PO CAPS: ORAL_CAPSULE | ORAL | Status: DC

## 2011-09-12 ENCOUNTER — Other Ambulatory Visit: Payer: Self-pay | Admitting: Nurse Practitioner

## 2011-09-12 MED ORDER — ONDANSETRON HCL 8 MG PO TABS: 8.0000 mg | ORAL_TABLET | Freq: Two times a day (BID) | ORAL | Status: DC | PRN

## 2011-09-12 MED ORDER — OXYCODONE-ACETAMINOPHEN 5-325 MG PO TABS: 1.0000 | ORAL_TABLET | Freq: Two times a day (BID) | ORAL | Status: DC

## 2011-09-12 MED ORDER — OXYCODONE-ACETAMINOPHEN 5-325 MG PO TABS: 1.0000 | ORAL_TABLET | ORAL | Status: AC | PRN

## 2011-09-12 NOTE — Telephone Encounter (Signed)
RECEIVED A FAX FROM [facility] CONCERNING A CONFIRMATION OF PRESCRIPTION SHIPMENT FOR REVLIMID. 

## 2011-09-23 ENCOUNTER — Ambulatory Visit (HOSPITAL_BASED_OUTPATIENT_CLINIC_OR_DEPARTMENT_OTHER): Payer: Medicare Other

## 2011-09-23 ENCOUNTER — Other Ambulatory Visit (HOSPITAL_BASED_OUTPATIENT_CLINIC_OR_DEPARTMENT_OTHER): Payer: Medicare Other | Admitting: Lab

## 2011-09-23 DIAGNOSIS — C9000 Multiple myeloma not having achieved remission: Secondary | ICD-10-CM

## 2011-09-23 DIAGNOSIS — Z452 Encounter for adjustment and management of vascular access device: Secondary | ICD-10-CM

## 2011-09-23 LAB — CBC WITH DIFFERENTIAL/PLATELET
BASO%: 0.4 % (ref 0.0–2.0)
MCHC: 33 g/dL (ref 31.5–36.0)
MONO#: 0.8 10*3/uL (ref 0.1–0.9)
RBC: 4.3 10*6/uL (ref 3.70–5.45)
RDW: 16.3 % — ABNORMAL HIGH (ref 11.2–14.5)
WBC: 7 10*3/uL (ref 3.9–10.3)
lymph#: 1.5 10*3/uL (ref 0.9–3.3)
nRBC: 0 % (ref 0–0)

## 2011-09-23 MED ORDER — HEPARIN SOD (PORK) LOCK FLUSH 100 UNIT/ML IV SOLN
500.0000 [IU] | Freq: Once | INTRAVENOUS | Status: AC
  Administered 2011-09-23: 500 [IU] via INTRAVENOUS
  Filled 2011-09-23: qty 5

## 2011-09-23 MED ORDER — SODIUM CHLORIDE 0.9 % IJ SOLN
10.0000 mL | INTRAMUSCULAR | Status: DC | PRN
  Administered 2011-09-23: 10 mL via INTRAVENOUS
  Filled 2011-09-23: qty 10

## 2011-10-07 ENCOUNTER — Other Ambulatory Visit: Payer: Self-pay | Admitting: *Deleted

## 2011-10-07 DIAGNOSIS — C9000 Multiple myeloma not having achieved remission: Secondary | ICD-10-CM

## 2011-10-07 MED ORDER — LENALIDOMIDE 10 MG PO CAPS: ORAL_CAPSULE | ORAL | Status: DC

## 2011-10-07 NOTE — Telephone Encounter (Signed)
RECEIVED A FAX FROM [facility] CONCERNING A CONFIRMATION OF FACSIMILE RECEIPT. 

## 2011-10-07 NOTE — Telephone Encounter (Signed)
Addended by: Arvilla Meres on: 10/07/2011 12:44 PM   Modules accepted: Orders

## 2011-10-07 NOTE — Telephone Encounter (Signed)
THIS REQUEST WAS GIVEN TO DR.MURINSON'S NURSE, ROBIN BASS,RN. 

## 2011-10-08 NOTE — Telephone Encounter (Signed)
[facility] faxed confirmation of Revlimid prescription shipment.  Shipped revlimid on 10-07-11 with next business day delivery.

## 2011-10-09 ENCOUNTER — Other Ambulatory Visit: Payer: Self-pay

## 2011-10-09 DIAGNOSIS — C9000 Multiple myeloma not having achieved remission: Secondary | ICD-10-CM

## 2011-10-09 MED ORDER — OXYCODONE-ACETAMINOPHEN 5-325 MG PO TABS: 1.0000 | ORAL_TABLET | Freq: Two times a day (BID) | ORAL | Status: DC

## 2011-10-16 ENCOUNTER — Telehealth: Payer: Self-pay | Admitting: Medical Oncology

## 2011-10-16 NOTE — Telephone Encounter (Signed)
Pt's grandaughter called stating that pt has a left swollen ankle. She states there is no swelling in the calf or leg just in the ankle area. I asked if she has twisted her ankle or stepped wrong and she states no. I spoke with Dr. Arline Asp and he feels pt need to be evaluated. We have no appointments today or tomorrow. He suggests she sees her primary MD. [facility] states they do not have a primary only Dr. Arline Asp and a cardiologist. I explained they can go to urgent care to have it evaluated. She voiced understanding.

## 2011-11-03 ENCOUNTER — Telehealth: Payer: Self-pay | Admitting: *Deleted

## 2011-11-03 ENCOUNTER — Other Ambulatory Visit: Payer: Self-pay

## 2011-11-03 DIAGNOSIS — C9000 Multiple myeloma not having achieved remission: Secondary | ICD-10-CM

## 2011-11-03 NOTE — Telephone Encounter (Signed)
[facility] faxed revlimid refill request.  Request to MD for review.

## 2011-11-04 ENCOUNTER — Other Ambulatory Visit: Payer: Self-pay

## 2011-11-04 ENCOUNTER — Other Ambulatory Visit: Payer: Self-pay | Admitting: *Deleted

## 2011-11-04 DIAGNOSIS — C9000 Multiple myeloma not having achieved remission: Secondary | ICD-10-CM

## 2011-11-04 MED ORDER — ALLOPURINOL 100 MG PO TABS: 200.0000 mg | ORAL_TABLET | Freq: Every day | ORAL | Status: DC

## 2011-11-04 NOTE — Telephone Encounter (Signed)
Revlimid refill sent to [facility] yesterday by collaborative nurse.  Confirmation of facsimile receipt received from [facility].  [facility] will verify patient's insurance coverage and make delivery arrangements.

## 2011-11-04 NOTE — Telephone Encounter (Signed)
[name] called asking for allopurinol refill, one month to be [facility] to [facility] and long term Rx to be called to mail order company.

## 2011-11-04 NOTE — Telephone Encounter (Signed)
[name] called asking for allopurinol refill one month to be [facility] and the long term Rx to

## 2011-11-04 NOTE — Telephone Encounter (Signed)
[facility] called requesting refill of allopurinol, one month to be [facility] and long term to be sent to mail order company. Refill done.

## 2011-11-05 ENCOUNTER — Other Ambulatory Visit: Payer: Self-pay | Admitting: *Deleted

## 2011-11-05 DIAGNOSIS — C9000 Multiple myeloma not having achieved remission: Secondary | ICD-10-CM

## 2011-11-05 MED ORDER — ALLOPURINOL 100 MG PO TABS: 200.0000 mg | ORAL_TABLET | Freq: Every day | ORAL | Status: DC

## 2011-11-05 NOTE — Telephone Encounter (Signed)
Pt's daughter, Lars Masson called stating she spoke with Olegario Messier yest. & she was supposed to call in [facility] & to mail order.  She has been to local pharmacy & they did not have script.  Pt is out of drug.  Called in 2 wk supply [facility] & daughter notified.

## 2011-11-05 NOTE — Telephone Encounter (Signed)
Revlimid refill sent by collaborative nurse.  Triage received confirmation of prescription shipment.  Shipped 11-04-11 with next business day delivery.

## 2011-11-06 MED ORDER — LENALIDOMIDE 10 MG PO CAPS: ORAL_CAPSULE | ORAL | Status: DC

## 2011-11-14 ENCOUNTER — Other Ambulatory Visit: Payer: Self-pay | Admitting: Oncology

## 2011-11-14 DIAGNOSIS — C9000 Multiple myeloma not having achieved remission: Secondary | ICD-10-CM

## 2011-11-17 ENCOUNTER — Other Ambulatory Visit: Payer: Self-pay | Admitting: Nurse Practitioner

## 2011-11-17 DIAGNOSIS — C9000 Multiple myeloma not having achieved remission: Secondary | ICD-10-CM

## 2011-11-17 MED ORDER — OXYCODONE-ACETAMINOPHEN 5-325 MG PO TABS: 1.0000 | ORAL_TABLET | Freq: Two times a day (BID) | ORAL | Status: AC

## 2011-11-24 ENCOUNTER — Telehealth: Payer: Self-pay | Admitting: Oncology

## 2011-11-24 ENCOUNTER — Other Ambulatory Visit (HOSPITAL_BASED_OUTPATIENT_CLINIC_OR_DEPARTMENT_OTHER): Payer: Medicare Other | Admitting: Lab

## 2011-11-24 ENCOUNTER — Telehealth: Payer: Self-pay | Admitting: Internal Medicine

## 2011-11-24 ENCOUNTER — Encounter: Payer: Self-pay | Admitting: Oncology

## 2011-11-24 ENCOUNTER — Ambulatory Visit (HOSPITAL_BASED_OUTPATIENT_CLINIC_OR_DEPARTMENT_OTHER): Payer: Medicare Other | Admitting: Oncology

## 2011-11-24 VITALS — BP 144/85 | HR 68 | Temp 97.4°F | Ht 62.0 in | Wt 160.7 lb

## 2011-11-24 DIAGNOSIS — C9000 Multiple myeloma not having achieved remission: Secondary | ICD-10-CM

## 2011-11-24 DIAGNOSIS — E559 Vitamin D deficiency, unspecified: Secondary | ICD-10-CM

## 2011-11-24 DIAGNOSIS — E119 Type 2 diabetes mellitus without complications: Secondary | ICD-10-CM

## 2011-11-24 DIAGNOSIS — R197 Diarrhea, unspecified: Secondary | ICD-10-CM

## 2011-11-24 LAB — COMPREHENSIVE METABOLIC PANEL
BUN: 7 mg/dL (ref 6–23)
CO2: 31 mEq/L (ref 19–32)
Calcium: 9.1 mg/dL (ref 8.4–10.5)
Chloride: 104 mEq/L (ref 96–112)
Creatinine, Ser: 0.84 mg/dL (ref 0.50–1.10)

## 2011-11-24 LAB — LACTATE DEHYDROGENASE: LDH: 216 U/L (ref 94–250)

## 2011-11-24 LAB — CBC WITH DIFFERENTIAL/PLATELET
Basophils Absolute: 0 10*3/uL (ref 0.0–0.1)
HCT: 46.1 % (ref 34.8–46.6)
HGB: 15.2 g/dL (ref 11.6–15.9)
MCH: 33.9 pg (ref 25.1–34.0)
MONO#: 1.6 10*3/uL — ABNORMAL HIGH (ref 0.1–0.9)
NEUT%: 55.4 % (ref 38.4–76.8)
WBC: 7.9 10*3/uL (ref 3.9–10.3)
lymph#: 1.5 10*3/uL (ref 0.9–3.3)

## 2011-11-24 NOTE — Progress Notes (Signed)
CC:   Lindsey Dalton, M.D. Lindsey Dalton. Lindsey Dalton, M.D.  PROBLEM LIST:  1. Kappa light chain multiple myeloma diagnosed in April 2010 with 51%  plasma cells in the marrow. Kappa light chains evident on serum  and urine immunofixation electrophoresis and 8.3 gm of protein in  the urine. FISH studies showed 13q minus and 11:14 translocation  indicative of a poor prognosis. The patient initially was treated  with Velcade and Decadron from mid-May 2010 through mid-May 2011  with an excellent response. She also received Zometa from  09/15/08 to 11/01/10. The patient also has been receiving Revlimid since June 2010 and has continued on Revlimid and Decadron  since that time in a program as described above. She has had an  excellent response to treatment. Last metastatic bone survey was  on 07/28/2011 and showed no significant changes when compared with  the prior study of 09/05/2010. There were a few lytic lesions, but  no evidence for progression. The patient's last 24-hour urine  collection was on 07/28/2011 and revealed 53 mg of protein. The UIFE was negative for monoclonal light chains.  We have not repeated the bone marrow.    2. History of congestive heart failure with coronary artery disease  status post cardiac catheterizations in 2009.  3. History of diverticulosis and lower GI bleed in May 2010.  4. History of multiple compression fractures with balloon kyphoplasty  involving T11, T12, L1, on 08/17/2008.  5. Hypertension.  6. Diabetes mellitus.  7. Dyslipidemia.  8. Hiatal hernia.  9. Severe degenerative joint disease.  10.Vitamin D deficiency.  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  12.Chronic diarrhea occurring 5-6 times a day, 3-4 days a week, associated with some incontinence.  The patient states that diarrhea has been ongoing since the time of her myeloma diagnosis which actually dates back 3 years.  Apparently, the symptom is getting  worse. 13.Right-sided Port-A-Cath placed on 04/09/2009.   MEDICATIONS:  1. Allopurinol 200 mg daily.  2. Aspirin 81 mg daily.  3. Atenolol 50 mg daily.  4. Citracal Plus D 1 tablet twice daily.  5. Vitamin D 1000 units daily.  6. Decadron 8 mg weekly on Thursday.  7. Ferrous sulfate 325 mg twice daily. 8. Mucinex 1200 mg as needed.  9. Revlimid 10 mg daily 3 weeks out of every 4 weeks.  10.Slow-Mag 1 tablet twice daily.  11.Prilosec 20 mg daily as needed.  12.Zofran 8 mg every 12 hours as needed.  13.OxyIR with acetaminophen 5/325 one tablet twice daily.  14.K-Dur 10 mEq 2 tablets twice daily.  15.Phenergan 25 mg tablet as needed.  16.Zocor 40 mg at bedtime.  17.Restoril 30 mg at bedtime.  Pneumovax was given in May 2010.  Flu shot was given on 03/25/2011.     HISTORY:  I saw Lindsey Dalton today for followup of her kappa light chain multiple myeloma.  She is accompanied by her granddaughter, Lindsey Dalton, who lives close by.  Lindsey Dalton lives by herself. She was last seen by [place] on 07/24/2011.  She continues to take Revlimid 10 mg daily 3 weeks out of every four.  She is due to go on break from her Revlimid this Saturday, July 20th.  She also takes Decadron 8 mg weekly by mouth every Thursday.  The patient seems to be tolerating this fairly well.  The patient tells me that she has been feeling very poorly for the last several weeks.  She says she has  no appetite, is hardly eating.  She has pain in her back and ribs, also in her chest.  She has increasing fatigue, some nausea and vomiting over the last night or so. She also has had some coughing.  She also complains of diarrhea.  Aside from this, the patient seems to be managing okay on her own.  It will be recalled that she was found to have osteonecrosis of the mandible.  She has been followed by her dentist, Lindsey Dalton, with whom I had spoken a few months ago.  The patient tells me that some bone fragments  were removed and that the gums have healed over, and that she is no longer having any problems with her mandible at this time.  The patient continues to take oxycodone once or twice a day for her discomfort.  This has not increased.  She often takes Tylenol as well.  PHYSICAL EXAMINATION:  General:  The patient does not really look ill or as though she is in any type of a discomfort.  She is sitting up on the examining table.  She is in no acute distress.  Vital Signs:  Weight is stable at 160.7 pounds, height 5 feet 2 inches, body surface area 1.79 sq m.  Blood pressure 144/85.  Other vital signs are normal.  HEENT: There is no scleral icterus.  Mouth and pharynx:  The patient has dentures which I did not remove.  No obvious pathology.  No peripheral adenopathy palpable.  Lungs:  Inspiratory rales halfway up both lungs. Cardiac:  Regular rhythm without murmur or rub.  The patient has a right- sided Port-A-Cath that will be flushed with heparin today.  This was flushed with heparin 2 months ago.  The port was covered with EMLA cream.  Abdomen:  Benign with no organomegaly or masses palpable. Extremities:  Ankles and legs are slightly puffy without pitting, but they are tender.  Neurologic:  Nonfocal.  The patient has marked dorsal kyphosis.  She has some tenderness of her ribs.  As stated, she has been coughing.  LABORATORY DATA:  Today, white count 7.9, ANC 4.4, hemoglobin 15.2, hematocrit 46.1, platelets 168,000.  Urine protein-to-creatinine ratio is pending.  Chemistries today:  Notable for an albumin of 3.3, down from 3.8, otherwise normal.  LDH was 216.  BUN 7, creatinine 0.84. Vitamin D level on 07/24/2011 was 46.  24-hour urine collection on 07/28/2011  was notable for only 53 mg of protein as compared with 78 mg on 08/15/2009, 293 mg on 01/05/2009.  It will be recalled that at one time there were over 8 gm of protein in the urine.  The urine immunofixation electrophoresis was  negative for any monoclonal free light chains.  The urine free kappa/lambda ratio was 19.10 on 07/28/2011.   IMAGING STUDIES:  1. Metastatic bone survey carried out on 07/20/2009  showed osteopenia. There were some small lucent lesions within the  skull consistent with a either venous lakes or multiple myeloma  deposits. There were numerous wedge compression fractures and  degenerative changes throughout the spine. There was a wedge  compression of L3 of indeterminate age.  2. Chest x-ray, 2-view, from  07/25/2010 showed no active cardiopulmonary disease.  3. Metastatic bone  survey from 09/05/2010 showed unchanged osseous survey with a few lytic  lesions that were seen in the skull, possibly the right femur at the  junction of the middle and distal thirds. There were compression  fractures of the thoracic and lumbar spine  unchanged. The patient is  status post vertebroplasty at T11, T12, and L1. A small lytic lesion  was seen in the scapula. There was a degenerative cyst in the greater  tuberosity of the left humerus. 4. Metastatic bone survey on 07/28/2011 showed minimal progression of small lytic lesions in the skull compatible with a history of multiple myeloma.  There was no significant change in the small lytic lesions in left scapula.  There was probably better visualization of small lytic lesions in the proximal left femur and poor visualization of a small lytic lesion in the distal right femur.  There was stable diffuse osteopenia and multiple vertebral compression fractures.   IMPRESSION AND PLAN:  The patient clinically seems to be having more difficulty but yet her latest 24-hour urine protein had dropped to 53 mg and her urine immunofixation electrophoresis was negative for urine proteins, all of which is quite encouraging.  I am not sure whether the patient has sore ribs from coughing or whether she perhaps has suffered a compression fracture.  I am also concerned  about her diarrhea which seems to be getting worse.  We are going to make a referral to a gastroenterologist.  The patient's Port-A-Cath will be flushed with heparin today.  We will go ahead and get a chest x-ray to assess the lungs with regard to the rales, also look at the ribs to make sure there is not any obvious lesion or rib fracture.  We may need to do a further x-rays of the spine or perhaps MRI of the thoracic spine if the patient's symptoms continue.  For the moment, she will continue with the current program of Revlimid and Decadron as described above.  We will plan to see Lindsey Dalton again in approximately 1 month at which time we will check CBC, chemistries, urine protein-to-creatinine ratio and urine for light chains.  We may want to repeat the 24-hour urine collection if we suspect that the patient is having progressive myeloma.    ______________________________ Samul Dada, M.D. DSM/MEDQ  D:  11/24/2011  T:  11/24/2011  Job:  [id number]

## 2011-11-24 NOTE — Telephone Encounter (Signed)
Pt will be seeing Dr. Juanda Chance GI in September, pt also put on cancellation list, if patient needs to be seen stat, they will accommodate patient, information relayed to patient. Appt calendar given to pt for August 2013

## 2011-11-24 NOTE — Progress Notes (Signed)
This office note has been dictated.  #[id number]

## 2011-11-26 NOTE — Telephone Encounter (Signed)
Ongoing for 79yrs will keep Appt they already have and c-b if anything changes or get any worse.

## 2011-12-01 ENCOUNTER — Other Ambulatory Visit: Payer: Self-pay | Admitting: *Deleted

## 2011-12-01 DIAGNOSIS — C9000 Multiple myeloma not having achieved remission: Secondary | ICD-10-CM

## 2011-12-01 MED ORDER — LENALIDOMIDE 10 MG PO CAPS: ORAL_CAPSULE | ORAL | Status: DC

## 2011-12-01 NOTE — Addendum Note (Signed)
Addended by: Arvilla Meres on: 12/01/2011 12:29 PM   Modules accepted: Orders

## 2011-12-01 NOTE — Telephone Encounter (Signed)
RECEIVED A FAX FROM [facility] CONCERNING A FACSIMILE RECEIPT FOR PT.'S REFERRAL.

## 2011-12-01 NOTE — Telephone Encounter (Signed)
THIS REFILL REQUEST FOR REVLIMID WAS GIVEN TO DR.MURINSON'S NURSE, KATHY BUYCK,RN. 

## 2011-12-03 NOTE — Telephone Encounter (Signed)
[facility] faxed confirmation of prescription shipment.  Revlimid was shipped 12/02/2011 with next business day delivery.   

## 2011-12-07 ENCOUNTER — Other Ambulatory Visit: Payer: Self-pay | Admitting: Oncology

## 2011-12-07 ENCOUNTER — Telehealth: Payer: Self-pay | Admitting: Oncology

## 2011-12-07 DIAGNOSIS — C9000 Multiple myeloma not having achieved remission: Secondary | ICD-10-CM

## 2011-12-07 MED ORDER — PROMETHAZINE HCL 25 MG RE SUPP: 25.0000 mg | Freq: Four times a day (QID) | RECTAL | Status: AC | PRN

## 2011-12-22 ENCOUNTER — Telehealth: Payer: Self-pay | Admitting: Oncology

## 2011-12-22 ENCOUNTER — Ambulatory Visit (HOSPITAL_BASED_OUTPATIENT_CLINIC_OR_DEPARTMENT_OTHER): Payer: Medicare Other | Admitting: Oncology

## 2011-12-22 ENCOUNTER — Other Ambulatory Visit: Payer: Self-pay | Admitting: Medical Oncology

## 2011-12-22 ENCOUNTER — Other Ambulatory Visit (HOSPITAL_BASED_OUTPATIENT_CLINIC_OR_DEPARTMENT_OTHER): Payer: Medicare Other | Admitting: Lab

## 2011-12-22 ENCOUNTER — Encounter: Payer: Self-pay | Admitting: Oncology

## 2011-12-22 VITALS — BP 156/94 | HR 81 | Temp 97.0°F | Resp 24 | Ht 62.0 in | Wt 164.0 lb

## 2011-12-22 DIAGNOSIS — C9000 Multiple myeloma not having achieved remission: Secondary | ICD-10-CM

## 2011-12-22 LAB — COMPREHENSIVE METABOLIC PANEL
Albumin: 3.5 g/dL (ref 3.5–5.2)
Alkaline Phosphatase: 58 U/L (ref 39–117)
BUN: 11 mg/dL (ref 6–23)
CO2: 29 mEq/L (ref 19–32)
Calcium: 9.1 mg/dL (ref 8.4–10.5)
Chloride: 106 mEq/L (ref 96–112)
Glucose, Bld: 88 mg/dL (ref 70–99)
Potassium: 3.8 mEq/L (ref 3.5–5.3)
Sodium: 142 mEq/L (ref 135–145)
Total Protein: 5 g/dL — ABNORMAL LOW (ref 6.0–8.3)

## 2011-12-22 LAB — CBC WITH DIFFERENTIAL/PLATELET
Basophils Absolute: 0 10*3/uL (ref 0.0–0.1)
Eosinophils Absolute: 0.3 10*3/uL (ref 0.0–0.5)
HGB: 14.4 g/dL (ref 11.6–15.9)
MONO#: 1.4 10*3/uL — ABNORMAL HIGH (ref 0.1–0.9)
NEUT#: 3.8 10*3/uL (ref 1.5–6.5)
RBC: 4.19 10*6/uL (ref 3.70–5.45)
RDW: 17.3 % — ABNORMAL HIGH (ref 11.2–14.5)
WBC: 7.2 10*3/uL (ref 3.9–10.3)
lymph#: 1.7 10*3/uL (ref 0.9–3.3)

## 2011-12-22 LAB — LACTATE DEHYDROGENASE: LDH: 160 U/L (ref 94–250)

## 2011-12-22 MED ORDER — OXYCODONE-ACETAMINOPHEN 5-325 MG PO TABS: 1.0000 | ORAL_TABLET | ORAL | Status: DC | PRN

## 2011-12-22 MED ORDER — TEMAZEPAM 30 MG PO CAPS: 30.0000 mg | ORAL_CAPSULE | Freq: Every evening | ORAL | Status: DC | PRN

## 2011-12-22 NOTE — Progress Notes (Signed)
CC:   [name], M.D. [name]. [name], M.D.  PROBLEM LIST:  1. Kappa light chain multiple myeloma diagnosed in April 2010 with 51%  plasma cells in the marrow. Kappa light chains evident on serum  and urine immunofixation electrophoresis and 8.3 gm of protein in  the urine. FISH studies showed 13q minus and 11:14 translocation  indicative of a poor prognosis. The patient initially was treated  with Velcade and Decadron from mid-May 2010 through mid-May 2011  with an excellent response. She also received Zometa from  09/15/08 to 11/01/10. The patient also has been receiving Revlimid since June 2010 and has continued on Revlimid and Decadron  since that time in a program as described above. She has had an  excellent response to treatment. Last metastatic bone survey was  on 07/28/2011 and showed no significant changes when compared with  the prior study of 09/05/2010. There were a few lytic lesions, but  no evidence for progression. The patient's last 24-hour urine  collection was on 07/28/2011 and revealed 53 mg of protein.  The UIFE was negative for monoclonal light chains. We  have not repeated the bone marrow.   2. History of congestive heart failure with coronary artery disease  status post cardiac catheterizations in 2009.   3. History of diverticulosis and lower GI bleed in May 2010.   4. History of multiple compression fractures with balloon kyphoplasty  involving T11, T12, L1, on 08/17/2008.   5. Hypertension.  6. Diabetes mellitus.  7. Dyslipidemia.  8. Hiatal hernia.  9. Severe degenerative joint disease.  10.Vitamin D deficiency.  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  12.Chronic diarrhea occurring 5-6 times a day, 3-4 days a week, associated  with some incontinence. The patient states that diarrhea has been  ongoing since the time of her myeloma diagnosis which actually dates  back 3 years. Apparently, the symptom is  getting worse.  13.Right-sided Port-A-Cath placed on 04/09/2009.   MEDICATIONS:  1. Allopurinol 200 mg daily.  2. Aspirin 81 mg daily.  3. Atenolol 50 mg daily.  4. Citracal Plus D 1 tablet twice daily.  5. Vitamin D 1000 units daily.  6. Decadron 8 mg weekly on Thursday.  7. Ferrous sulfate 325 mg twice daily.  8. Mucinex 1200 mg as needed.  9. Revlimid 10 mg daily 3 weeks out of every 4 weeks.  10.Slow-Mag 1 tablet twice daily.  11.Prilosec 20 mg daily as needed.  12.Zofran 8 mg every 12 hours as needed.  13.OxyIR with acetaminophen 5/325 one tablet twice daily.  14.K-Dur 10 mEq 2 tablets twice daily.  15.Phenergan 25 mg tablets and suppositories as needed.  16.Zocor 40 mg at bedtime.  17.Restoril 30 mg at bedtime.   Pneumovax was given in May 2010.  Flu shot was given on 03/25/2011.    HISTORY:  Lindsey Dalton is seen today for followup of her kappa light chain multiple myeloma.  She is accompanied by her granddaughter, [name], who lives nearby.  Ms. Spurling continues to live by herself.  She was last seen by [place] on 11/24/2011.  We had been seeing her at 3 and 4 month intervals.  However, on her last visit here Ms. Romey was having a lot of problems.  She is continuing to complain of intermittent nausea, vomiting and diarrhea, as well as pain around her low ribs bilaterally and back.  The patient takes a couple of Percocet a day.  She tells me  that she is not sleeping well.  She says she feels a little better than she did a month ago.  She denies any recent cough.  As stated, her diarrhea seems to be somewhat improved. She states that she had 1 normal bowel movement yesterday and 1 today. Back a couple of weeks ago she said she was having severe nausea, vomiting and diarrhea.  She says that these symptoms have been ongoing since the diagnosis of her multiple myeloma extending back over 3 years ago.  She does not notice any relationship to the Revlimid or  the Decadron.  Her pains apparently are no better with the Decadron 8 mg that she takes every Thursday.  The patient states that she is due to complete her 3 weeks of Revlimid on Saturday August 17.  She then goes on a 1 week break.  As stated she is on Revlimid 10 mg daily 3 weeks out of every 4 weeks.  She also takes Decadron 8 mg weekly on Thursdays.  The patient's myeloma appeared to be under fairly good control.  She has had metastatic bone survey carried out most recently on 07/28/2011. Results were fairly stable.  There may have been some progression of the small lytic lesions in her skull.  It will be recalled that her 24 hour urine collection, which was obtained in March of this year, contained only 53 mg of protein.  The urine immunofixation electrophoresis was negative.  This compares with 8.3 g of protein at the time of diagnosis back in April 2010.  Thus it would appear that the patient has had an excellent response to the current treatment.  PHYSICAL EXAM:  [name] certainly looks well.  She moves fairly well on the examining table, lying back and sitting up without any apparent pain.  She does not appear to be in any pain or acute distress at the moment.  She really looks quite well, especially for her age of 43.  Weight is 164 pounds.  Height 5 feet 2 inches.  Body surface area 1.8 m2.  Blood pressure 156/94.  Other vital signs are normal.  O2 saturation a month ago was 98%.  There is no scleral icterus.  Mouth and pharynx are benign.  There is no peripheral adenopathy palpable.  The patient continues to have inspiratory rales at both bases.  Rales seen less prominent than a month ago.  Cardiac exam regular rhythm without murmur or rub.  The patient has a right-sided Port-A-Cath that was flushed with heparin a month ago.  The port will need to be flushed next month.  Abdomen is benign.  Extremities:  1 to 2+ edema of the ankles. The patient seems to have rather  discrete marked tenderness to palpation of her posterolateral ribs bilaterally.  She seems more tender over the lower ribs than she does the back or other ribs.  She also was quite tender over her lower legs to palpation.  Neurologic exam was nonfocal.  LABORATORY DATA:  Today, white count 7.2, ANC 3.8, hemoglobin 14.4, hematocrit 43.7, platelets 160,000.  Chemistries today are pending.  The patient was unable to provide [place] with a urine specimen so we could check a urine protein to creatinine ratio.  She also was unable to give [place] a urine specimen last month on 07/15.  Chemistries from 07/15 are essentially normal except for an albumin of 3.3, and a total protein of 5.5.  LDH was 216.  Vitamin D level was 42.  IMAGING STUDIES:  1. Metastatic bone survey carried out on 07/20/2009  showed osteopenia. There were some small lucent lesions within the  skull consistent with a either venous lakes or multiple myeloma  deposits. There were numerous wedge compression fractures and  degenerative changes throughout the spine. There was a wedge  compression of L3 of indeterminate age.  2. Chest x-ray, 2-view, from  07/25/2010 showed no active cardiopulmonary disease.  3. Metastatic bone  survey from 09/05/2010 showed unchanged osseous survey with a few lytic  lesions that were seen in the skull, possibly the right femur at the  junction of the middle and distal thirds. There were compression  fractures of the thoracic and lumbar spine unchanged. The patient is  status post vertebroplasty at T11, T12, and L1. A small lytic lesion  was seen in the scapula. There was a degenerative cyst in the greater  tuberosity of the left humerus.  4. Metastatic bone survey on 07/28/2011 showed minimal progression of small  lytic lesions in the skull compatible with a history of multiple  myeloma. There was no significant change in the small lytic lesions in  left scapula. There was probably better visualization  of small lytic  lesions in the proximal left femur and poor visualization of a small  lytic lesion in the distal right femur. There was stable diffuse  osteopenia and multiple vertebral compression fractures.   IMPRESSION AND PLAN:  I am a little perplexed by Ms. Peary's ongoing symptoms.  There seems to be somewhat of a disconnect between how she looks and her rendition of her symptoms.  She also seemed to be able to lie down and sit up on the examining table without any real distress or pain.  Her ribs seem more tender to palpation.  In addition, if we focus on her urine protein excretion, she has had a truly outstanding response to the current treatment.  At this point we will go ahead with metastatic bone survey and a 24 hour urine collection for total protein urine immunofixation electrophoresis and urine protein to creatinine ratio.  The patient was supposed to get a chest x-ray a month ago but for whatever reason did not obtain this. She apparently has an appointment for her diarrhea sometime in September with one of the [facility] GI physicians.  We are refilling the patient's prescription for the Percocet and the Restoril.  We will continue with the current program, i.e. Revlimid 10 mg daily 3 weeks out of every 4 and Decadron 8 mg weekly on Thursdays.  We will plan to see Ms. Pavao again in about 1 month, at which time we will check CBC and chemistries.  She will be due for a Port-A-Cath flush at that time.  Hopefully before that appointment we will have the results of the metastatic bone survey, chest x-ray and 24 hour urine collection.    ______________________________ Samul Dada, M.D. DSM/MEDQ  D:  12/22/2011  T:  12/22/2011  Job:  [id number]

## 2011-12-22 NOTE — Telephone Encounter (Signed)
Gave pt appt calendar appt for August, 2013 Bone Survey and Chest Xray, see MD in September 2013 lab and MD

## 2011-12-22 NOTE — Progress Notes (Signed)
This office note has been dictated.  #[id number]

## 2011-12-23 ENCOUNTER — Other Ambulatory Visit: Payer: Self-pay | Admitting: *Deleted

## 2011-12-23 DIAGNOSIS — C9000 Multiple myeloma not having achieved remission: Secondary | ICD-10-CM

## 2011-12-23 MED ORDER — LENALIDOMIDE 10 MG PO CAPS: ORAL_CAPSULE | ORAL | Status: DC

## 2011-12-23 NOTE — Addendum Note (Signed)
Addended by: Arvilla Meres on: 12/23/2011 11:45 AM   Modules accepted: Orders

## 2011-12-23 NOTE — Telephone Encounter (Signed)
RECEIVED A FAX FROM [facility] CONCERNING A CONFIRMATION OF FACSIMILE RECEIPT FOR PT.'S REFERRAL. 

## 2011-12-23 NOTE — Telephone Encounter (Signed)
THIS REFILL REQUEST FOR REVLIMID WAS GIVEN TO DR.MURINSON'S NURSE, ROBIN BASS,RN. 

## 2011-12-24 ENCOUNTER — Ambulatory Visit: Payer: Medicare Other

## 2011-12-24 ENCOUNTER — Ambulatory Visit (HOSPITAL_COMMUNITY)
Admission: RE | Admit: 2011-12-24 | Discharge: 2011-12-24 | Disposition: A | Discharge status: Home / Self Care | Payer: Medicare Other | Source: Ambulatory Visit | Attending: Oncology | Admitting: Oncology

## 2011-12-24 DIAGNOSIS — C9000 Multiple myeloma not having achieved remission: Secondary | ICD-10-CM

## 2011-12-24 DIAGNOSIS — M25559 Pain in unspecified hip: Secondary | ICD-10-CM | POA: Insufficient documentation

## 2011-12-24 DIAGNOSIS — R079 Chest pain, unspecified: Secondary | ICD-10-CM | POA: Insufficient documentation

## 2011-12-24 NOTE — Progress Notes (Signed)
Quick Note:  Please notify patient and call/fax these results to patient's doctors. ______ 

## 2011-12-25 ENCOUNTER — Telehealth: Payer: Self-pay | Admitting: *Deleted

## 2011-12-25 NOTE — Telephone Encounter (Signed)
Called patient and notified her per Dr Arline Asp that CXR/Bone Survey showed no changes.

## 2011-12-26 ENCOUNTER — Encounter: Payer: Self-pay | Admitting: Oncology

## 2011-12-26 LAB — UIFE/LIGHT CHAINS/TP QN, 24-HR UR
Albumin, U: DETECTED
Beta, Urine: DETECTED — AB
Free Kappa Lt Chains,Ur: 2.74 mg/dL — ABNORMAL HIGH (ref 0.14–2.42)
Free Lambda Excretion/Day: 1.4 mg/d
Free Lambda Lt Chains,Ur: 0.08 mg/dL (ref 0.02–0.67)
Free Lt Chn Excr Rate: 47.95 mg/d
Gamma Globulin, Urine: DETECTED — AB
Time: 24 hours
Volume, Urine: 1750 mL

## 2011-12-26 NOTE — Progress Notes (Signed)
A 24-hour urine collection was obtained on 12/24/2011. Total protein was 58 mg as compared with 53 mg on 07/28/2011. Urine immunofixation electrophoresis showed no monoclonal free light chains. There was a polyclonal increase in free kappa and/or free lambda light chains.

## 2012-01-01 NOTE — Telephone Encounter (Signed)
RECEIVED A FAX FROM [facility] CONCERNING A CONFIRMATION OF PRESCRIPTION SHIPMENT FOR REVLIMID ON 12/31/11.

## 2012-01-08 ENCOUNTER — Encounter: Payer: Self-pay | Admitting: *Deleted

## 2012-01-19 ENCOUNTER — Encounter: Payer: Self-pay | Admitting: Medical Oncology

## 2012-01-19 ENCOUNTER — Ambulatory Visit (HOSPITAL_BASED_OUTPATIENT_CLINIC_OR_DEPARTMENT_OTHER): Payer: Medicare Other | Admitting: Oncology

## 2012-01-19 ENCOUNTER — Other Ambulatory Visit: Payer: Self-pay | Admitting: Medical Oncology

## 2012-01-19 ENCOUNTER — Encounter: Payer: Self-pay | Admitting: Oncology

## 2012-01-19 ENCOUNTER — Other Ambulatory Visit (HOSPITAL_BASED_OUTPATIENT_CLINIC_OR_DEPARTMENT_OTHER): Payer: Medicare Other | Admitting: Lab

## 2012-01-19 VITALS — BP 131/92 | HR 91 | Temp 96.9°F | Resp 18 | Ht 62.0 in | Wt 159.8 lb

## 2012-01-19 DIAGNOSIS — E559 Vitamin D deficiency, unspecified: Secondary | ICD-10-CM

## 2012-01-19 DIAGNOSIS — C9000 Multiple myeloma not having achieved remission: Secondary | ICD-10-CM

## 2012-01-19 DIAGNOSIS — R197 Diarrhea, unspecified: Secondary | ICD-10-CM

## 2012-01-19 LAB — CBC WITH DIFFERENTIAL/PLATELET
BASO%: 0.6 % (ref 0.0–2.0)
HCT: 43.1 % (ref 34.8–46.6)
LYMPH%: 17.1 % (ref 14.0–49.7)
MCHC: 32.8 g/dL (ref 31.5–36.0)
MCV: 101.9 fL — ABNORMAL HIGH (ref 79.5–101.0)
MONO#: 1.5 10*3/uL — ABNORMAL HIGH (ref 0.1–0.9)
MONO%: 19.5 % — ABNORMAL HIGH (ref 0.0–14.0)
NEUT%: 54.7 % (ref 38.4–76.8)
Platelets: 222 10*3/uL (ref 145–400)
RBC: 4.23 10*6/uL (ref 3.70–5.45)
WBC: 7.8 10*3/uL (ref 3.9–10.3)

## 2012-01-19 LAB — COMPREHENSIVE METABOLIC PANEL (CC13)
Albumin: 3 g/dL — ABNORMAL LOW (ref 3.5–5.0)
BUN: 13 mg/dL (ref 7.0–26.0)
CO2: 26 mEq/L (ref 22–29)
Glucose: 97 mg/dl (ref 70–99)
Potassium: 4.1 mEq/L (ref 3.5–5.1)
Sodium: 140 mEq/L (ref 136–145)
Total Bilirubin: 0.4 mg/dL (ref 0.20–1.20)
Total Protein: 5 g/dL — ABNORMAL LOW (ref 6.4–8.3)

## 2012-01-19 LAB — LACTATE DEHYDROGENASE (CC13): LDH: 157 U/L (ref 125–220)

## 2012-01-19 MED ORDER — CITALOPRAM HYDROBROMIDE 10 MG PO TABS: 10.0000 mg | ORAL_TABLET | Freq: Every day | ORAL | Status: DC

## 2012-01-19 MED ORDER — SODIUM CHLORIDE 0.9 % IJ SOLN
10.0000 mL | INTRAMUSCULAR | Status: DC | PRN
  Administered 2012-01-19: 10 mL via INTRAVENOUS
  Filled 2012-01-19: qty 10

## 2012-01-19 MED ORDER — HEPARIN SOD (PORK) LOCK FLUSH 100 UNIT/ML IV SOLN
500.0000 [IU] | Freq: Once | INTRAVENOUS | Status: AC
  Administered 2012-01-19: 500 [IU] via INTRAVENOUS
  Filled 2012-01-19: qty 5

## 2012-01-19 MED ORDER — OXYCODONE-ACETAMINOPHEN 5-325 MG PO TABS: 1.0000 | ORAL_TABLET | ORAL | Status: DC | PRN

## 2012-01-19 NOTE — Telephone Encounter (Signed)
Gave pt appt for November 2013 lab and MD °

## 2012-01-19 NOTE — Progress Notes (Signed)
This office note has been dictated.  #[id number]

## 2012-01-19 NOTE — Progress Notes (Signed)
CC:   [name], M.D. [name]. [name], M.D.  PROBLEM LIST:  1. Kappa light chain multiple myeloma diagnosed in April 2010 with 51%  plasma cells in the marrow. Kappa light chains evident on serum  and urine immunofixation electrophoresis and 8.3 gm of protein in  the urine. FISH studies showed 13q minus and 11:14 translocation  indicative of a poor prognosis. The patient initially was treated  with Velcade and Decadron from mid-May 2010 through mid-May 2011  with an excellent response. She also received Zometa from  09/15/08 to 11/01/10. The patient also has been receiving Revlimid since June 2010 and has continued on Revlimid and Decadron  since that time in a program as described above. She has had an  excellent response to treatment. Last metastatic bone survey was  on 12/24/2011 and showed no significant changes when compared with  the prior study of 07/20/2011. There were a few lytic lesions, but  no evidence for progression. The patient's last 24-hour urine  collection was on 12/24/2011 and revealed 58 mg of protein.  The UIFE was negative for monoclonal light chains. We  have not repeated the bone marrow since the initial bone marrow of April 2010 which showed 51% plasma cells.   2. History of congestive heart failure with coronary artery disease  status post cardiac catheterizations in 2009.  3. History of diverticulosis and lower GI bleed in May 2010.  4. History of multiple compression fractures with balloon kyphoplasty  involving T11, T12, L1, on 08/17/2008.  5. Hypertension.  6. Diabetes mellitus.  7. Dyslipidemia.  8. Hiatal hernia.  9. Severe degenerative joint disease.  10.Vitamin D deficiency.  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  12.Chronic diarrhea occurring 5-6 times a day, 3-4 days a week, associated  with some incontinence. The patient states that diarrhea has been  ongoing since the time of her myeloma  diagnosis which actually dates  back 3 years. Apparently, the symptom is getting worse.  13.Right-sided Port-A-Cath placed on 04/09/2009.  MEDICATIONS:  1. Allopurinol 200 mg daily.  2. Aspirin 81 mg daily.  3. Atenolol 50 mg daily.  4. Citracal Plus D 1 tablet twice daily.  5. Vitamin D 1000 units daily.  6. Decadron 8 mg weekly on Thursday.  7. Ferrous sulfate 325 mg twice daily.  8. Mucinex 1200 mg as needed.  9. Revlimid 10 mg daily 3 weeks out of every 4 weeks.  10.Slow-Mag 1 tablet twice daily.  11.Prilosec 20 mg daily as needed.  12.Zofran 8 mg every 12 hours as needed.  13.OxyIR with acetaminophen 5/325 one tablet twice daily.  14.K-Dur 10 mEq 2 tablets twice daily.  15.Phenergan 25 mg tablets and suppositories as needed.  16.Zocor 40 mg at bedtime.  17.Restoril 30 mg at bedtime. 18.Celexa 10 mg daily starting 01/19/2012.   Pneumovax was given in May 2010.  Flu shot was given on 03/25/2011.   HISTORY:  I saw Lindsey Dalton today for followup of her kappa light chain multiple myeloma.  She is accompanied by her granddaughter, [name], who lives nearby.  Ms. Mayford Knife was last seen by [place] on 12/22/2011.  At that time, she had a multitude of complaints.  Since then, her condition seems to have improved.  I am delighted at how her myeloma studies have turned out without any significant changes.  The patient has had a major response from her treatments.  It will be recalled that at one time she had  over 8 gm of protein in her urine and now the protein levels have been repeatedly less than 100 mg and the urine immunofixation electrophoresis does not disclose a monoclonal protein.  The patient admits to feeling some depression and we are going to give her Celexa 10 mg daily.  She has an appointment with one of the [place] [facility] specialists for next week to evaluate her diarrhea which apparently has continued sometimes with incontinence.  The patient states her pain is  somewhat improved.  Her appetite is fair and she has lost some weight.  PHYSICAL EXAMINATION:  General:  She looks well.  She is able to get up and down from the examining table with little assistance.  Vital Signs: Weight is 159.9 pounds.  Height 5 feet 2 inches.  Body surface area 1.78 sq m.  Blood pressure 131/92.  Other vital signs are normal.  HEENT: There is no scleral icterus.  Mouth and pharynx benign.  No peripheral adenopathy palpable.  Respiratory:  Once again, the patient continues to have rather loud inspiratory rales and crackles at both bases.  These have tended to fluctuate making me think that the patient may have some interstitial lung disease although this was not evident on the chest x- ray.  Cardiac:  Regular rhythm without murmur or rub.  The patient has a right-sided Port-A-Cath which will be flushed with heparin today.  We are flushing the port with heparin every 2 months.  Abdomen:  Benign. Extremities:  The left ankle is quite puffy with minimal pitting edema. The right ankle is also puffy but to a lesser degree.  Neurologic: Nonfocal.  The patient seems less tender over the ribs than she did on her last visit here 1 month ago.  LABORATORY DATA:  Today, white count 7.8, ANC 4.3, hemoglobin 14.1, hematocrit 43.1, platelets 222,000.  MCV is elevated 101.9.  Chemistries from today are notable for an albumin of 3.0, total protein 5.0. Globulins were 2.0.  Other tests were normal.  24-hour urine collection obtained on 12/24/2011 yielded 58 mg as compared with 53 mg on 07/28/2011.  Urine immunofixation electrophoresis showed no monoclonal free light chains.  There was a polyclonal increase in free kappa and/or free lambda light chains.  IMAGING STUDIES:  1. Metastatic bone survey carried out on 07/20/2009  showed osteopenia. There were some small lucent lesions within the  skull consistent with a either venous lakes or multiple myeloma  deposits. There were  numerous wedge compression fractures and  degenerative changes throughout the spine. There was a wedge  compression of L3 of indeterminate age.  2. Chest x-ray, 2-view, from  07/25/2010 showed no active cardiopulmonary disease.  3. Metastatic bone  survey from 09/05/2010 showed unchanged osseous survey with a few lytic  lesions that were seen in the skull, possibly the right femur at the  junction of the middle and distal thirds. There were compression  fractures of the thoracic and lumbar spine unchanged. The patient is  status post vertebroplasty at T11, T12, and L1. A small lytic lesion  was seen in the scapula. There was a degenerative cyst in the greater  tuberosity of the left humerus.  4. Metastatic bone survey on 07/28/2011 showed minimal progression of small  lytic lesions in the skull compatible with a history of multiple  myeloma. There was no significant change in the small lytic lesions in  left scapula. There was probably better visualization of small lytic  lesions in the proximal left femur and  poor visualization of a small  lytic lesion in the distal right femur. There was stable diffuse  osteopenia and multiple vertebral compression fractures. 5. Metastatic bone survey from 12/24/2011 showed multiple compression     fractures in the spine which are stable.  There were tiny lytic     lesions seen in the humeri and in the left clavicle as well as the     left scapula.  These were felt to be stable.  No impending     pathologic fractures. 6. Chest x-ray, two-view, showed no acute disease.  IMPRESSION AND PLAN:  Ms. Hamlett' re-evaluation regarding multiple myeloma came out excellent.  She seems to be a little bit better than she was a month ago.  She admits to being somewhat depressed although her affect does not strike me as being one of depression.  We will go ahead and give her some Celexa 10 mg daily starting today.  The patient has an appointment for GI  evaluation regarding her diarrhea.  She will continue with Revlimid 10 mg 3 weeks on, 1 week off.  She is due to complete the Revlimid, this cycle on Saturday, September 14th.  She takes Decadron 8 mg by mouth every Thursday.  We will continue on this program which has worked so well for her.  We will check a CBC in 1 month and we will plan to see the patient again in approximately 2 months at which time we will check CBC, chemistries, and a urine protein- to-creatinine ratio.  I also believe we are checking a urine protein-to- creatinine ratio today as well.    ______________________________ Samul Dada, M.D. DSM/MEDQ  D:  01/19/2012  T:  01/19/2012  Job:  [id number]

## 2012-01-19 NOTE — Progress Notes (Signed)
I accidentally e-scribed celexa to [facility]. I called [facility] to cancel the prescription and faxed to [facility].

## 2012-01-23 ENCOUNTER — Telehealth: Payer: Self-pay | Admitting: Oncology

## 2012-01-23 NOTE — Telephone Encounter (Signed)
S/w the pt's granddtr and she is aware of the lab appt

## 2012-01-27 ENCOUNTER — Ambulatory Visit (INDEPENDENT_AMBULATORY_CARE_PROVIDER_SITE_OTHER): Payer: Medicare Other | Admitting: Internal Medicine

## 2012-01-27 ENCOUNTER — Other Ambulatory Visit: Payer: Self-pay | Admitting: *Deleted

## 2012-01-27 ENCOUNTER — Encounter: Payer: Self-pay | Admitting: Internal Medicine

## 2012-01-27 ENCOUNTER — Other Ambulatory Visit (INDEPENDENT_AMBULATORY_CARE_PROVIDER_SITE_OTHER): Payer: Medicare Other

## 2012-01-27 VITALS — BP 132/64 | HR 80 | Ht 62.0 in | Wt 157.0 lb

## 2012-01-27 DIAGNOSIS — R634 Abnormal weight loss: Secondary | ICD-10-CM

## 2012-01-27 DIAGNOSIS — R197 Diarrhea, unspecified: Secondary | ICD-10-CM

## 2012-01-27 DIAGNOSIS — C9000 Multiple myeloma not having achieved remission: Secondary | ICD-10-CM

## 2012-01-27 LAB — MAGNESIUM: Magnesium: 2 mg/dL (ref 1.5–2.5)

## 2012-01-27 MED ORDER — MOVIPREP 100 G PO SOLR: ORAL | Status: DC

## 2012-01-27 MED ORDER — DIPHENOXYLATE-ATROPINE 2.5-0.025 MG PO TABS: 1.0000 | ORAL_TABLET | Freq: Two times a day (BID) | ORAL | Status: AC

## 2012-01-27 NOTE — Telephone Encounter (Signed)
THIS REFILL REQUEST FOR REVLIMID WAS GIVEN TO DR.MURINSON'S NURSE, ROBIN BASS,RN. 

## 2012-01-27 NOTE — Patient Instructions (Addendum)
You have been scheduled for an endoscopy and colonoscopy. Please follow the written instructions given to you at your visit today. Please pick up your prep at the pharmacy within the next 1-3 days. If you use inhalers (even only as needed), please bring them with you on the day of your procedure. Your physician has requested that you go to the basement for the following lab work before leaving today: Magnesium, Celiac 10 panel We have sent the following medications to your pharmacy for you to pick up at your convenience: Lomotil CC: Dr [name], Dr Arline Asp

## 2012-01-27 NOTE — Progress Notes (Signed)
Lindsey Dalton Feb 24, 1924 MRN [id number]  History of Present Illness:  This is a 78 year old African American female with advanced multiple myeloma kappa light chain, diagnosed in April 2010 with 51% plasma cells in the bone marrow. She has been treated with Revlimid with good results. Her last bone survey showed stable disease. She has a Port-A-Cath. She was hospitalized for a lower GI bleed in May 2010 attributed to diverticuli. She was at that time seen by Dr. Elnoria Howard who was planning to do colonoscopy as an outpatient but patient never followed up with him in the office. I saw her in the office about 30 years ago for unrelated reasons, mostly epigastric pain, reflux and hiatal hernia. Her main complaint today is chronic diarrhea since 2010, having urgent and some incontinent stools which are watery and very loose. They are dark because she is on iron supplements They occur mostly during the day, occasionally at night. She has been gradually losing weight; about 8 pounds in the last 2 months. She has been on magnesium tablets twice a day. Her appetite has been decreased but she has no nausea or vomiting. She has some epigastric discomfort. She has a history of congestive heart failure, coronary artery disease and diabetes.   Past Medical History  Diagnosis Date  . CHF (congestive heart failure)   . Diverticulosis   . Lower GI bleed   . Hiatal hernia   . Hypercalcemia   . Osteoporosis with fracture   . CAD (coronary artery disease)   . HTN (hypertension)   . DM (diabetes mellitus)   . DJD (degenerative joint disease)   . Dyslipidemia   . Multiple myeloma 04/25/2011  . Vitamin d deficiency 04/25/2011  . Anemia    Past Surgical History  Procedure Date  . Fixation kyphoplasty     T11, T12, L1  . Total abdominal hysterectomy     reports that she has never smoked. She has never used smokeless tobacco. She reports that she does not drink alcohol or use illicit drugs. family history  includes Cervical cancer in her daughter.  There is no history of Colon cancer. No Known Allergies      Review of Systems: Occasional epigastric and subxiphoid pain. No dysphagia. Musculoskeletal pains. Denies abdominal pain. Denies rectal bleeding  The remainder of the 10 point ROS is negative except as outlined in H&P   Physical Exam: General appearance  Well developed, in no distress. Eyes- non icteric. HEENT nontraumatic, normocephalic. Mouth no lesions, tongue papillated, no cheilosis. Neck supple without adenopathy, thyroid not enlarged, no carotid bruits, no JVD. Port-A-Cath present Lungs Clear to auscultation bilaterally. Cor normal S1, normal S2, regular rhythm, no murmur,  quiet precordium. Abdomen: Soft with hyperactive bowel sounds. No tenderness. No distention. No tympany. Liver edge at costal margin.  Rectal: Decreased rectal sphincter tone. Soft and dark Hemoccult negative stool. Extremities trace pedal edema. Skin no lesions. Neurological alert and oriented x 3. Psychological normal mood and affect.  Assessment and Plan  Problem #41 78 year old [place] American female with persistent diarrhea of several years duration with concomitant weight loss. Possibilities include a medication-induced diarrhea due to magnesium supplements. Revlimid may cause diarrhea in 46% of patient's and can cause irritable bowel syndrome or microscopic colitis as well. Because of the weight loss, I wonder if she is having some element of malabsorption. We will check a sprue profile and put her on a probiotic one a day. Her hyperactive bowel sounds may indicate small bowel hypermotility. We will  start Lomotil one twice a day on a regular basis. I would plan on a small bowel biopsy and colon biopsies to rule out microscopic colitis. We will do that in the hospital as an outpatient endoscopy and colonoscopy using her Port-A-Cath as a venous access. I have discussed the plan of treatment and diagnostic  tests with her and her granddaughter who came with her. She will discontinue her magnesium supplement, her Mg2+ level is normal at 2.0. but will remain on Revlimid.    01/27/2012 Lina Sar

## 2012-01-28 ENCOUNTER — Other Ambulatory Visit: Payer: Self-pay | Admitting: *Deleted

## 2012-01-28 DIAGNOSIS — C9000 Multiple myeloma not having achieved remission: Secondary | ICD-10-CM

## 2012-01-28 LAB — CELIAC PANEL 10: IgA: 149 mg/dL (ref 69–380)

## 2012-01-28 MED ORDER — LENALIDOMIDE 10 MG PO CAPS: ORAL_CAPSULE | ORAL | Status: DC

## 2012-01-28 NOTE — Telephone Encounter (Signed)
[facility] faxed confirmation of facsimile for revlimid.  Insurance information will be verified and delivery arrangements made with pt.

## 2012-01-29 ENCOUNTER — Encounter: Payer: Self-pay | Admitting: Dietician

## 2012-01-29 ENCOUNTER — Other Ambulatory Visit: Payer: Self-pay | Admitting: *Deleted

## 2012-01-29 NOTE — Telephone Encounter (Signed)
Revlimid shipped 01/28/12

## 2012-01-29 NOTE — Progress Notes (Signed)
Brief Out-patient Oncology Nutrition Note  Reason: Patient Screened Positive For Nutrition Risk For Unintentional Weight Loss and Decreased Appetite.   Lindsey Dalton is an 78 year old female patient of Dr. Arline Asp, diagnosed with myeloma. Contacted patient via telephone for positive nutrition risk. Patient had a visit with the cancer center RD in April of 2013. Patient reported she did not have any questions about the nutrition information provided. Patient reported her appetite has not been good. She reported she eats a little something to take her medications with. She also stated she drinks 2 Ensure daily.   Wt Readings from Last 10 Encounters:  01/27/12 157 lb (71.215 kg)  01/19/12 159 lb 12.8 oz (72.485 kg)  12/22/11 164 lb (74.39 kg)  11/24/11 160 lb 11.2 oz (72.893 kg)  08/20/11 161 lb 12.8 oz (73.392 kg)  07/24/11 155 lb 9.6 oz (70.58 kg)  04/25/11 163 lb 9.6 oz (74.208 kg)  03/06/11 161 lb 1.6 oz (73.074 kg)    I have re-educated the patient on high calorie, high protein diet. We discussed strategies to increase caloric intake. The patient was without any further nutrition related questions. Provided RD contact information and instructed patient to contact RD for future questions.   RD available for nutrition needs.   Iven Finn, MS, RD, LDN [phone]

## 2012-02-03 ENCOUNTER — Telehealth: Payer: Self-pay | Admitting: *Deleted

## 2012-02-03 NOTE — Telephone Encounter (Signed)
Dr Juanda Chance has now reviewed records from Dr Elnoria Howard and has found that patient had a colonoscopy much more recent than previously thought. Therefore, Dr Juanda Chance would like patient to have an endoscopy with flexible sigmoidoscopy instead of colonoscopy. I have spoken to [facility] and have changed procedure. I have also spoken to patient. She already picked up her colonoscopy kit at the pharmacy, therefore, she will go ahead and prep using the Moviprep instead of fleets enemas. Patient verbalizes understanding that she will be having a flexible sigmoidoscopy instead of colonoscopy.

## 2012-02-16 ENCOUNTER — Other Ambulatory Visit (HOSPITAL_BASED_OUTPATIENT_CLINIC_OR_DEPARTMENT_OTHER): Payer: Medicare Other | Admitting: Lab

## 2012-02-16 DIAGNOSIS — C9000 Multiple myeloma not having achieved remission: Secondary | ICD-10-CM

## 2012-02-16 LAB — CBC WITH DIFFERENTIAL/PLATELET
Basophils Absolute: 0 10*3/uL (ref 0.0–0.1)
Eosinophils Absolute: 0.6 10*3/uL — ABNORMAL HIGH (ref 0.0–0.5)
HCT: 42 % (ref 34.8–46.6)
HGB: 13.6 g/dL (ref 11.6–15.9)
LYMPH%: 19.1 % (ref 14.0–49.7)
MONO#: 1.7 10*3/uL — ABNORMAL HIGH (ref 0.1–0.9)
NEUT#: 4.2 10*3/uL (ref 1.5–6.5)
NEUT%: 52.6 % (ref 38.4–76.8)
Platelets: 210 10*3/uL (ref 145–400)
WBC: 8 10*3/uL (ref 3.9–10.3)
nRBC: 0 % (ref 0–0)

## 2012-02-23 ENCOUNTER — Other Ambulatory Visit: Payer: Self-pay | Admitting: *Deleted

## 2012-02-23 DIAGNOSIS — C9000 Multiple myeloma not having achieved remission: Secondary | ICD-10-CM

## 2012-02-23 MED ORDER — LENALIDOMIDE 10 MG PO CAPS: ORAL_CAPSULE | ORAL | Status: DC

## 2012-02-23 NOTE — Telephone Encounter (Signed)
THIS REFILL REQUEST FOR REVLIMID WAS GIVEN TO DR.MURINSON'S NURSE, ROBIN BASS,RN. 

## 2012-02-23 NOTE — Addendum Note (Signed)
Addended by: Arvilla Meres on: 02/23/2012 12:19 PM   Modules accepted: Orders

## 2012-02-23 NOTE — Telephone Encounter (Signed)
RECEIVED A FAX FROM [facility] CONCERNING A CONFIRMATION OF FACSIMILE RECEIPT FOR PT.'S REFERRAL. 

## 2012-02-27 NOTE — Telephone Encounter (Signed)
RECEIVED A FAX FROM [facility] CONCERNING A CONFIRMATION OF PRESCRIPTION SHIPMENT FOR REVLIMID ON 02/26/12. 

## 2012-03-01 ENCOUNTER — Encounter (HOSPITAL_COMMUNITY): Payer: Self-pay

## 2012-03-01 ENCOUNTER — Ambulatory Visit (HOSPITAL_COMMUNITY)
Admission: RE | Admit: 2012-03-01 | Discharge: 2012-03-01 | Disposition: A | Discharge status: Home / Self Care | Payer: Medicare Other | Source: Ambulatory Visit | Attending: Internal Medicine | Admitting: Internal Medicine

## 2012-03-01 ENCOUNTER — Encounter (HOSPITAL_COMMUNITY)
Admission: RE | Disposition: A | Discharge status: Home / Self Care | Payer: Self-pay | Source: Ambulatory Visit | Attending: Internal Medicine

## 2012-03-01 DIAGNOSIS — R197 Diarrhea, unspecified: Secondary | ICD-10-CM

## 2012-03-01 DIAGNOSIS — I509 Heart failure, unspecified: Secondary | ICD-10-CM | POA: Insufficient documentation

## 2012-03-01 DIAGNOSIS — R634 Abnormal weight loss: Secondary | ICD-10-CM

## 2012-03-01 DIAGNOSIS — E785 Hyperlipidemia, unspecified: Secondary | ICD-10-CM | POA: Insufficient documentation

## 2012-03-01 DIAGNOSIS — I10 Essential (primary) hypertension: Secondary | ICD-10-CM | POA: Insufficient documentation

## 2012-03-01 DIAGNOSIS — N816 Rectocele: Secondary | ICD-10-CM | POA: Insufficient documentation

## 2012-03-01 DIAGNOSIS — E119 Type 2 diabetes mellitus without complications: Secondary | ICD-10-CM | POA: Insufficient documentation

## 2012-03-01 DIAGNOSIS — C9000 Multiple myeloma not having achieved remission: Secondary | ICD-10-CM | POA: Insufficient documentation

## 2012-03-01 DIAGNOSIS — K5730 Diverticulosis of large intestine without perforation or abscess without bleeding: Secondary | ICD-10-CM | POA: Insufficient documentation

## 2012-03-01 DIAGNOSIS — Z79899 Other long term (current) drug therapy: Secondary | ICD-10-CM | POA: Insufficient documentation

## 2012-03-01 HISTORY — PX: ESOPHAGOGASTRODUODENOSCOPY: SHX5428

## 2012-03-01 HISTORY — PX: FLEXIBLE SIGMOIDOSCOPY: SHX5431

## 2012-03-01 SURGERY — EGD (ESOPHAGOGASTRODUODENOSCOPY)
Anesthesia: Moderate Sedation

## 2012-03-01 MED ORDER — MIDAZOLAM HCL 10 MG/2ML IJ SOLN
INTRAMUSCULAR | Status: AC
  Filled 2012-03-01: qty 4

## 2012-03-01 MED ORDER — FENTANYL CITRATE 0.05 MG/ML IJ SOLN
INTRAMUSCULAR | Status: AC
  Filled 2012-03-01: qty 4

## 2012-03-01 MED ORDER — HEPARIN SOD (PORK) LOCK FLUSH 100 UNIT/ML IV SOLN
500.0000 [IU] | INTRAVENOUS | Status: AC | PRN
  Administered 2012-03-01: 500 [IU]

## 2012-03-01 MED ORDER — MIDAZOLAM HCL 10 MG/2ML IJ SOLN
INTRAMUSCULAR | Status: DC | PRN
  Administered 2012-03-01 (×2): 2 mg via INTRAVENOUS

## 2012-03-01 MED ORDER — FENTANYL CITRATE 0.05 MG/ML IJ SOLN
INTRAMUSCULAR | Status: DC | PRN
  Administered 2012-03-01 (×2): 20 ug via INTRAVENOUS

## 2012-03-01 MED ORDER — SODIUM CHLORIDE 0.9 % IV SOLN
INTRAVENOUS | Status: DC
  Administered 2012-03-01: 10:00:00 via INTRAVENOUS

## 2012-03-01 MED ORDER — BUTAMBEN-TETRACAINE-BENZOCAINE 2-2-14 % EX AERO
INHALATION_SPRAY | CUTANEOUS | Status: DC | PRN
  Administered 2012-03-01: 2 via TOPICAL

## 2012-03-01 NOTE — Op Note (Signed)
[facility] [address] [place], [address]   ENDOSCOPY PROCEDURE REPORT  PATIENT: Lindsey Dalton, Lindsey Dalton  MR#: [id number] BIRTHDATE: 1923/05/29 , 88  yrs. old GENDER: Female ENDOSCOPIST: Hart Carwin, MD REFERRED BY:  Dr Arline Asp, Dr Morrison Old PROCEDURE DATE:  03/01/2012 PROCEDURE:  EGD w/ biopsy ASA CLASS:     Class III INDICATIONS:  weight loss, diarrhea, r/o villous atrophy, multiple myeloma, on revulmid  ,pt had a full colonoscopy in 2010 for diverticular bleed MEDICATIONS: These medications were titrated to patient response per physician's verbal order, Versed-Detailed 4 mg IV, and Fentanyl-Detailed 40 mcg IV TOPICAL ANESTHETIC: Cetacaine Spray  DESCRIPTION OF PROCEDURE: After the risks benefits and alternatives of the procedure were thoroughly explained, informed consent was obtained.  The Pentax Gastroscope [id number] endoscope was introduced through the mouth and advanced to the second portion of the duodenum. Without limitations.  The instrument was slowly withdrawn as the mucosa was fully examined.    The upper, middle and distal third of the esophagus were carefully inspected and no abnormalities were noted.  The z-line was well seen at the GEJ.and was slightly irregular.  The endoscope was pushed into the fundus which was normal including a retroflexed view.  The antrum, gastric body, first and second part of the duodenum were unremarkable.  A biopsy was performed from the descending duodenum to r/o villous atrophy.  Retroflexed views revealed no abnormalities.     The scope was then withdrawn from the patient and the procedure completed.  COMPLICATIONS: There were no complications. ENDOSCOPIC IMPRESSION: Normal EGD; s/p small bowl biopsy to r/o villous atrophy  RECOMMENDATIONS: await pathology results flexible sigmoidoscopy  REPEAT EXAM: no recall  eSigned:  Hart Carwin, MD 03/01/2012 10:28 AM   CC:

## 2012-03-01 NOTE — Op Note (Signed)
[facility] [address] [place], [address]   FLEXIBLE SIGMOIDOSCOPY PROCEDURE REPORT  PATIENT: Lindsey, Dalton  MR#: [id number] BIRTHDATE: [birth year] , 88  yrs. old GENDER: Female ENDOSCOPIST: Hart Carwin, MD REFERRED BY: Dr Arline Asp, Dr Link Snuffer PROCEDURE DATE:  03/01/2012 PROCEDURE:   flexible sigmoidoscopy ASA CLASS:   Class III INDICATIONS:unexplained diarrhea.   pt had a full colonoscopy in 2010, now diarrhea, stool studies negative, she has improved on Lomotil and stopping Magnesium tabs,. MEDICATIONS: These medications were titrated to patient response per physician's verbal order and There was residual sedation effect present from prior procedure.  DESCRIPTION OF PROCEDURE:   After the risks benefits and alternatives of the procedure were thoroughly explained, informed consent was obtained.  revealed decreased sphincter tone. endoscope was introduced through the anus  and advanced to the descending colon ,  No adverse events experienced.   The quality of the prep was good .Mucosa appeared normal, there was a rectocele which descended into the rectum when pt strained or coughed, there were no polyps or evidence of colitis, biopsies taken randomly from 0- 70 cm,  The instrument was then slowly withdrawn as the mucosa was fully examined.       COLON FINDINGS: Mild diverticulosis was noted.  Retroflexed views revealed no abnormalities.    The scope was then withdrawn from the patient and the procedure terminated.  COMPLICATIONS: There were no complications.  ENDOSCOPIC IMPRESSION:  Minimal sigmoid diverticulosis normal appearing left colon- random biopsies to r/o microscopic colitis decreased/absent rectal sphincter tone rectocele Photodocumentation  inadvertedly moved to upper enndoscopy report- not available for this report  RECOMMENDATIONS: await biopsy results continue Lomotil 1 tid prn, hold Magnesium tablets   REPEAT  EXAM: for No recall due to age..   _______________________________ eSignedHart Carwin, MD 03/01/2012 10:45 AM   CC:

## 2012-03-01 NOTE — Progress Notes (Signed)
Porta cath deaccessed. Flushed with 10cc NS followed by Heparin 5ml (100u/ml). No bleeding to site, band aid to site for comfort. Lindsey Dalton M 

## 2012-03-01 NOTE — Consult Note (Signed)
MARTISHA TOULOUSE September 04, 1923 MRN [id number]        History of Present Illness:78 yo WF with advanced Multiple myeloma, treated with Levumid , c/o diarrhea, weight loss of 8 lbs,The patient has a history of of diverticular bleed 2010, had fiull colonoscopy by Dr Elnoria Howard althou she denies having a procedure. She is here today for EGD/small bowl biopsy and flexible sigmoidoscopy to r/o microscopic colitis.      Past Medical History  Diagnosis Date  . CHF (congestive heart failure)   . Diverticulosis   . Lower GI bleed   . Hiatal hernia   . Hypercalcemia   . Osteoporosis with fracture   . CAD (coronary artery disease)   . HTN (hypertension)   . DM (diabetes mellitus)   . DJD (degenerative joint disease)   . Dyslipidemia   . Multiple myeloma(203.0) 04/25/2011  . Vitamin D deficiency 04/25/2011  . Anemia    Past Surgical History  Procedure Date  . Fixation kyphoplasty     T11, T12, L1  . Total abdominal hysterectomy     reports that she has never smoked. She has never used smokeless tobacco. She reports that she does not drink alcohol or use illicit drugs. family history includes Cervical cancer in her daughter.  There is no history of Colon cancer. No Known Allergies      Review of Systems:  The remainder of the 10 point ROS is negative except as outlined in H&P   Physical Exam: General appearance  Chronically ill,, in no distress. Eyes- non icteric. HEENT nontraumatic, normocephalic. Mouth no lesions, tongue papillated, no cheilosis. Neck supple without adenopathy, thyroid not enlarged, no carotid bruits, no JVD. Lungs Clear to auscultation bilaterally. Cor normal S1, normal S2, regular rhythm, no murmur,  quiet precordium. Abdomen: hyperactive bowl sounds, soft, not distended Rectal:deferred Extremities no pedal edema. Skin no lesions. Neurological alert and oriented x 3. Psychological normal mood and affect.  Assessment and Plan:  78 yo F with wgt loss and  diarrhea which could be attributed to medications ( magnesium tablet, chemotherapeutic agent), r/i sprue, r/o villous atrophy   03/01/2012 Lina Sar

## 2012-03-02 ENCOUNTER — Encounter (HOSPITAL_COMMUNITY): Payer: Self-pay

## 2012-03-02 ENCOUNTER — Encounter (HOSPITAL_COMMUNITY): Payer: Self-pay | Admitting: Internal Medicine

## 2012-03-03 ENCOUNTER — Encounter: Payer: Self-pay | Admitting: Internal Medicine

## 2012-03-11 ENCOUNTER — Other Ambulatory Visit: Payer: Self-pay | Admitting: Oncology

## 2012-03-11 DIAGNOSIS — C9000 Multiple myeloma not having achieved remission: Secondary | ICD-10-CM

## 2012-03-15 ENCOUNTER — Other Ambulatory Visit: Payer: Medicare Other | Admitting: Lab

## 2012-03-15 ENCOUNTER — Ambulatory Visit: Payer: Medicare Other | Admitting: Family

## 2012-03-19 ENCOUNTER — Other Ambulatory Visit: Payer: Medicare Other | Admitting: Lab

## 2012-03-19 ENCOUNTER — Ambulatory Visit: Payer: Medicare Other | Admitting: Oncology

## 2012-03-19 ENCOUNTER — Other Ambulatory Visit: Payer: Self-pay | Admitting: *Deleted

## 2012-03-19 DIAGNOSIS — C9000 Multiple myeloma not having achieved remission: Secondary | ICD-10-CM

## 2012-03-19 MED ORDER — LENALIDOMIDE 10 MG PO CAPS: ORAL_CAPSULE | ORAL | Status: DC

## 2012-03-19 NOTE — Telephone Encounter (Signed)
THIS REFILL REQUEST FOR REVLIMID WAS GIVEN TO DR.MURINSON'S NURSE, KATHY BUYCK,RN. 

## 2012-03-19 NOTE — Addendum Note (Signed)
Addended by: Arvilla Meres on: 03/19/2012 03:55 PM   Modules accepted: Orders

## 2012-03-24 ENCOUNTER — Encounter: Payer: Self-pay | Admitting: *Deleted

## 2012-03-24 NOTE — Progress Notes (Signed)
Confirmation from [facility] Revlimid was shipped to patient on 03/23/12

## 2012-03-29 ENCOUNTER — Ambulatory Visit (HOSPITAL_BASED_OUTPATIENT_CLINIC_OR_DEPARTMENT_OTHER): Payer: Medicare Other | Admitting: Family

## 2012-03-29 ENCOUNTER — Other Ambulatory Visit (HOSPITAL_BASED_OUTPATIENT_CLINIC_OR_DEPARTMENT_OTHER): Payer: Medicare Other | Admitting: Lab

## 2012-03-29 ENCOUNTER — Encounter: Payer: Self-pay | Admitting: Family

## 2012-03-29 VITALS — BP 143/91 | HR 69 | Temp 98.0°F | Resp 20 | Ht 64.0 in | Wt 164.7 lb

## 2012-03-29 DIAGNOSIS — C9000 Multiple myeloma not having achieved remission: Secondary | ICD-10-CM

## 2012-03-29 DIAGNOSIS — E559 Vitamin D deficiency, unspecified: Secondary | ICD-10-CM

## 2012-03-29 DIAGNOSIS — M810 Age-related osteoporosis without current pathological fracture: Secondary | ICD-10-CM

## 2012-03-29 LAB — CBC WITH DIFFERENTIAL/PLATELET
BASO%: 1 % (ref 0.0–2.0)
EOS%: 3.3 % (ref 0.0–7.0)
HCT: 41.2 % (ref 34.8–46.6)
HGB: 13.7 g/dL (ref 11.6–15.9)
MCH: 33.6 pg (ref 25.1–34.0)
MCHC: 33.2 g/dL (ref 31.5–36.0)
MONO#: 1.4 10*3/uL — ABNORMAL HIGH (ref 0.1–0.9)
RDW: 16.6 % — ABNORMAL HIGH (ref 11.2–14.5)
WBC: 6.3 10*3/uL (ref 3.9–10.3)
lymph#: 1.5 10*3/uL (ref 0.9–3.3)

## 2012-03-29 LAB — COMPREHENSIVE METABOLIC PANEL (CC13)
ALT: 42 U/L (ref 0–55)
AST: 30 U/L (ref 5–34)
Albumin: 3 g/dL — ABNORMAL LOW (ref 3.5–5.0)
CO2: 26 mEq/L (ref 22–29)
Calcium: 8.9 mg/dL (ref 8.4–10.4)
Chloride: 110 mEq/L — ABNORMAL HIGH (ref 98–107)
Potassium: 3.6 mEq/L (ref 3.5–5.1)
Total Protein: 5.2 g/dL — ABNORMAL LOW (ref 6.4–8.3)

## 2012-03-29 LAB — PROTEIN / CREATININE RATIO, URINE: Protein Creatinine Ratio: 0.08 (ref <0.15)

## 2012-03-29 LAB — LACTATE DEHYDROGENASE (CC13): LDH: 172 U/L (ref 125–245)

## 2012-03-29 MED ORDER — TEMAZEPAM 30 MG PO CAPS: 30.0000 mg | ORAL_CAPSULE | Freq: Every evening | ORAL | Status: DC | PRN

## 2012-03-29 MED ORDER — HYDROCODONE-ACETAMINOPHEN 10-325 MG PO TABS: 0.5000 | ORAL_TABLET | ORAL | Status: DC | PRN

## 2012-03-29 NOTE — Patient Instructions (Addendum)
Please contact us if you have any questions or concerns.

## 2012-03-29 NOTE — Progress Notes (Signed)
Patient ID: Lindsey Dalton, female   DOB: Apr 20, 1924, 78 y.o.   MRN: [id number] CSN: [id number]  CC: Lindsey Junior, MD Lindsey Dalton Lindsey [name], MD Lindsey Penna, MD  Problem List: Lindsey Dalton is a 78 y.o. African-American female with a problem list consisting of:  1. Kappa light chain multiple myeloma diagnosed in April 2010 with 51% plasma cells in the marrow. Kappa light chains evident on serum and urine immunofixation electrophoresis and 8.3 gm of protein in the urine. FISH studies showed 13q minus and 11:14 translocation indicative of a poor prognosis. The patient initially was treated with Velcade and Decadron from mid-May 2010 through mid-May 2011 with an excellent response. She also received Zometa from 09/15/08 to 11/01/10. The patient also has been receiving Revlimid since June 2010 and has continued on Revlimid and Decadron since that time in a program as described above. She has had an excellent response to treatment. Last metastatic bone survey was on 12/24/2011 and showed no significant changes when compared with the prior study of 07/20/2011. There were a few lytic lesions, but no evidence for progression. The patient's last 24-hour urine collection was on 12/24/2011 and revealed 58 mg of protein. The UIFE was negative for monoclonal light chains. We have not repeated the bone marrow since the initial bone marrow of April 2010 which showed 51% plasma cells.  2. History of congestive heart failure with coronary artery disease status post cardiac catheterizations in 2009.  3. History of diverticulosis and lower GI bleed in May 2010.  4. History of multiple compression fractures with balloon kyphoplasty involving T11, T12, L1, on 08/17/2008.  5. Hypertension 6. Diabetes mellitus 7. Dyslipidemia 8. Hiatal hernia  9. Severe degenerative joint disease 10.Vitamin D deficiency 11.Osteonecrosis of the mandible following dental extractions in September 2012.  12.History of chronic diarrhea  occurring 5-6 times a day, 3-4 days a week, associated with some incontinence. The patient states that diarrhea has been ongoing since the time of her myeloma diagnosis which actually dates back 3 years.  Diarrhea has gotten better since stop taking magnesium supplement and received colonoscopy. 13.Right-sided Port-A-Cath placed on 04/09/2009. Pneumovax was given in May 2010. Influenza vaccination was given on 02/2012.  Received colonoscopy with Dr. Juanda Dalton on 03/01/2012.  Dr. Arline Dalton and I saw Lindsey Dalton today for followup of her kappa light chain multiple myeloma. She is accompanied by her granddaughter, Lindsey Dalton, who lives nearby. Lindsey Dalton was last seen by [place] on 01/19/2012.  Since her last office visit, she states that she has been doing well.  She has seen Dr. Juanda Dalton, GI and states that her diarrhea has improved.  Dr. Juanda Dalton completed a colonoscopy on 03/01/2012.  The patient was also told to discontinue taking a magnesium supplement by Dr. Link Dalton which may have also contributed to her diarrhea improving.  The patient states her depression is better since starting Celexa.  She still has complaints of a decreased appetite and chronic back pain, but overall she is doing well.  The patient and her granddaughter deny any other symptomatology.Dr. Arline Dalton states that her myeloma studies are without any significant changes. The patient has had a major response from her treatments. It will be recalled that at one time she had over 8 gm of protein in her urine and now the protein levels have been repeatedly less than 100 mg and the urine immunofixation electrophoresis does not disclose a monoclonal protein.  Lindsey Dalton asked for a refill on her Temazepam and a refill #  30 was given with 2 refills.  Dr. Arline Dalton changed Lindsey Dalton pain medication from OxyIR to Norco 10/325 mg PO  1/2 tablet, PRN for pain #60 with 2 refills.  She is scheduled for a physical with her PCP Dr. Link Dalton on  04/22/2012.   Past Medical History: Past Medical History  Diagnosis Date  . CHF (congestive heart failure)   . Diverticulosis   . Lower GI bleed   . Hiatal hernia   . Hypercalcemia   . Osteoporosis with fracture   . CAD (coronary artery disease)   . HTN (hypertension)   . DJD (degenerative joint disease)   . Dyslipidemia   . Multiple myeloma(203.0) 04/25/2011  . Vitamin D deficiency 04/25/2011  . Anemia     Surgical History: Past Surgical History  Procedure Date  . Fixation kyphoplasty     T11, T12, L1  . Total abdominal hysterectomy   . Esophagogastroduodenoscopy 03/01/2012    Procedure: ESOPHAGOGASTRODUODENOSCOPY (EGD);  Surgeon: Lindsey Carwin, MD;  Location: [facility];  Service: Endoscopy;  Laterality: N/A;  . Flexible sigmoidoscopy 03/01/2012    Procedure: FLEXIBLE SIGMOIDOSCOPY;  Surgeon: Lindsey Carwin, MD;  Location: [facility];  Service: Endoscopy;  Laterality: N/A;    Current Medications: Current Outpatient Prescriptions  Medication Sig Dispense Refill  . allopurinol (ZYLOPRIM) 100 MG tablet Take 2 tablets (200 mg total) by mouth daily.  28 tablet  0  . aspirin 81 MG tablet Take 81 mg by mouth daily.        Marland Kitchen atenolol (TENORMIN) 50 MG tablet Take 50 mg by mouth daily.        . Attapulgite (KAOPECTATE PO) Take by mouth as directed.      . calcium citrate-vitamin D (CITRACAL+D) 315-200 MG-UNIT per tablet Take 1 tablet by mouth 2 (two) times daily.       . cholecalciferol (VITAMIN D) 1000 UNITS tablet Take 1,000 Units by mouth daily.        . citalopram (CELEXA) 10 MG tablet TAKE 1 TABLET DAILY.  30 tablet  3  . dexamethasone (DECADRON) 4 MG tablet Take 4 mg by mouth. Take 2 tabs po weekly      . diphenoxylate-atropine (LOMOTIL) 2.5-0.025 MG per tablet Take 1 tablet by mouth 2 (two) times daily.  60 tablet  1  . ferrous gluconate (FERGON) 246 (28 FE) MG tablet Take 65 mg by mouth 2 (two) times daily. May be ferrous sulfate.... Will check and bring next time        . guaiFENesin (MUCINEX) 600 MG 12 hr tablet Take 1,200 mg by mouth as needed.        Marland Kitchen lenalidomide (REVLIMID) 10 MG capsule TAKE ONE CAPSULE DAILY  21 capsule  0  . Loperamide HCl (IMODIUM PO) Take by mouth as directed.      Marland Kitchen omeprazole (PRILOSEC) 20 MG capsule Take 20 mg by mouth daily as needed.       . ondansetron (ZOFRAN) 8 MG tablet TAKE 1 TABLET BY MOUTH EVERY 12 HOURS AS NEEDED FOR NAUSEA  20 tablet  0  . potassium chloride (K-DUR) 10 MEQ tablet Take 10 mEq by mouth 2 (two) times daily. Take 2 tabs twice a day      . promethazine (PHENERGAN) 25 MG suppository Place 1 suppository (25 mg total) rectally every 6 (six) hours as needed.  12 each  1  . promethazine (PHENERGAN) 25 MG tablet Take 25 mg by mouth every 6 (six) hours as needed.        Marland Kitchen  simvastatin (ZOCOR) 40 MG tablet Take 40 mg by mouth at bedtime.        . temazepam (RESTORIL) 30 MG capsule Take 1 capsule (30 mg total) by mouth at bedtime as needed.  30 capsule  2  . HYDROcodone-acetaminophen (NORCO) 10-325 MG per tablet Take 0.5 tablets by mouth as needed for pain.  60 tablet  2    Allergies: No Known Allergies  Family History: Family History  Problem Relation Age of Onset  . Colon cancer Neg Hx   . Cervical cancer Daughter   . Hypertension Mother   . Hypertension Father   . Hypertension Sister     Social History: History  Substance Use Topics  . Smoking status: Never Smoker   . Smokeless tobacco: Never Used  . Alcohol Use: No    Review of Systems: 10 Point review of systems was completed and is negative except as noted above.   Physical Exam:   Blood pressure 143/91, pulse 69, temperature 98 F (36.7 C), temperature source Oral, resp. rate 20, height 5\' 4"  (1.626 m), weight 164 lb 11.2 oz (74.707 kg).  General appearance: Alert, cooperative, well nourished, no apparent distress Head: Normocephalic, without obvious abnormality, atraumatic Eyes: Conjunctivae/corneas clear, arcus senilis, PERRLA,  EOMI Nose: Nares, septum and mucosa are normal, no drainage or sinus tenderness Neck: No adenopathy, supple, symmetrical, trachea midline, thyroid not enlarged, no tenderness Resp: Clear to auscultation bilaterally, diminished bibasilar breath sounds Cardio: Regular rate and rhythm, S1, S2 normal, no murmur, click, rub or gallop, right chest Port-A-Cath without signs of infection GI: Soft, non-tender, distended, hypoactive bowel sounds,  no organomegaly Extremities: Extremities normal, atraumatic, no cyanosis, bilateral LE pitting edema Lymph nodes: Cervical, supraclavicular, and axillary nodes normal Neurologic: Grossly normal   Laboratory Data: Results for orders placed in visit on 03/29/12 (from the past 48 hour(s))  CBC WITH DIFFERENTIAL     Status: Abnormal   Collection Time   03/29/12  3:45 PM      Component Value Range Comment   WBC 6.3  3.9 - 10.3 10e3/uL    NEUT# 3.1  1.5 - 6.5 10e3/uL    HGB 13.7  11.6 - 15.9 g/dL    HCT 81.1  91.4 - 78.2 %    Platelets 173  145 - 400 10e3/uL    MCV 101.4 (*) 79.5 - 101.0 fL    MCH 33.6  25.1 - 34.0 pg    MCHC 33.2  31.5 - 36.0 g/dL    RBC 9.56  2.13 - 0.86 10e6/uL    RDW 16.6 (*) 11.2 - 14.5 %    lymph# 1.5  0.9 - 3.3 10e3/uL    MONO# 1.4 (*) 0.1 - 0.9 10e3/uL    Eosinophils Absolute 0.2  0.0 - 0.5 10e3/uL    Basophils Absolute 0.1  0.0 - 0.1 10e3/uL    NEUT% 49.8  38.4 - 76.8 %    LYMPH% 23.5  14.0 - 49.7 %    MONO% 22.4 (*) 0.0 - 14.0 %    EOS% 3.3  0.0 - 7.0 %    BASO% 1.0  0.0 - 2.0 %   COMPREHENSIVE METABOLIC PANEL (CC13)     Status: Abnormal   Collection Time   03/29/12  3:45 PM      Component Value Range Comment   Sodium 140  136 - 145 mEq/L    Potassium 3.6  3.5 - 5.1 mEq/L    Chloride 110 (*) 98 - 107 mEq/L  CO2 26  22 - 29 mEq/L    Glucose 95  70 - 99 mg/dl    BUN 8.0  7.0 - 11.9 mg/dL    Creatinine 0.7  0.6 - 1.1 mg/dL    Total Bilirubin 1.47  0.20 - 1.20 mg/dL    Alkaline Phosphatase 59  40 - 150 U/L    AST 30   5 - 34 U/L    ALT 42  0 - 55 U/L    Total Protein 5.2 (*) 6.4 - 8.3 g/dL    Albumin 3.0 (*) 3.5 - 5.0 g/dL    Calcium 8.9  8.4 - 82.9 mg/dL   LACTATE DEHYDROGENASE (CC13)     Status: Normal   Collection Time   03/29/12  3:45 PM      Component Value Range Comment   LDH 172  125 - 245 U/L 03/18/12 - NOTE new reference range.    Imaging Studies: 1. Metastatic bone survey carried out on 07/20/2009 showed osteopenia. There were some small lucent lesions within the skull consistent with a either venous lakes or multiple myeloma deposits. There were numerous wedge compression fractures and degenerative changes throughout the spine. There was a wedge compression of L3 of indeterminate age.  2. Chest x-ray, 2-view, from 07/25/2010 showed no active cardiopulmonary disease.  3. Metastatic bone survey from 09/05/2010 showed unchanged osseous survey with a few lytic lesions that were seen in the skull, possibly the right femur at the junction of the middle and distal thirds. There were compression fractures of the thoracic and lumbar spine unchanged. The patient is status post vertebroplasty at T11, T12, and L1. A small lytic lesion was seen in the scapula. There was a degenerative cyst in the greater tuberosity of the left humerus.  4. Metastatic bone survey on 07/28/2011 showed minimal progression of small lytic lesions in the skull compatible with a history of multiple  myeloma. There was no significant change in the small lytic lesions in left scapula. There was probably better visualization of small lytic lesions in the proximal left femur and poor visualization of a small lytic lesion in the distal right femur. There was stable diffuse osteopenia and multiple vertebral compression fractures.  5. Metastatic bone survey from 12/24/2011 showed multiple compression fractures in the spine which are stable. There were tiny lytic lesions seen in the humeri and in the left clavicle as well as the left scapula.  These were felt to be stable. No impending pathologic fractures.  6. Chest x-ray, two-view, on 12/24/2011 showed no acute disease   Impression/Plan: Lindsey Dalton seems to be a bit better than she was during her last office visit.  She will continue with Revlimid 10 mg 3 weeks on, 1 week off.  She started her current Revlimid cycle yesterday, 03/28/2012.  She takes Decadron 8 mg by mouth every Thursday. We will continue her on this program which has worked so well for her. We will check a CBC monthly (04/26/2012 and 05/24/2012).  We plan to see the patient again in approximately 3 months (on 06/21/2012) at which time we will check CBC, chemistries, and a urine protein- to-creatinine ratio. The patient received a Port-A-Cath flush on 03/01/2012 and her next flushes are scheduled for 04/26/2012 and the day of her office visit on 06/21/2012.  Lindsey Dalton and her granddaughter are encouraged to contact us in the interim if they have any questions or concerns.   Larina Bras, NP-C 03/29/2012, 4:49 PM

## 2012-03-31 ENCOUNTER — Telehealth: Payer: Self-pay | Admitting: Oncology

## 2012-03-31 NOTE — Telephone Encounter (Signed)
S/w pt re next appt for 12/16 and mailed remaining appts. Pt aware.

## 2012-04-22 ENCOUNTER — Other Ambulatory Visit: Payer: Self-pay

## 2012-04-22 ENCOUNTER — Telehealth: Payer: Self-pay

## 2012-04-22 DIAGNOSIS — C9000 Multiple myeloma not having achieved remission: Secondary | ICD-10-CM

## 2012-04-22 MED ORDER — LENALIDOMIDE 10 MG PO CAPS: ORAL_CAPSULE | ORAL | Status: DC

## 2012-04-22 NOTE — Telephone Encounter (Signed)
Incoming fax that [facility] received revlimid prescription for pt

## 2012-04-26 ENCOUNTER — Ambulatory Visit (HOSPITAL_BASED_OUTPATIENT_CLINIC_OR_DEPARTMENT_OTHER): Payer: Medicare Other

## 2012-04-26 ENCOUNTER — Other Ambulatory Visit (HOSPITAL_BASED_OUTPATIENT_CLINIC_OR_DEPARTMENT_OTHER): Payer: Medicare Other | Admitting: Lab

## 2012-04-26 VITALS — BP 145/83 | HR 63 | Temp 97.8°F

## 2012-04-26 DIAGNOSIS — C9000 Multiple myeloma not having achieved remission: Secondary | ICD-10-CM

## 2012-04-26 DIAGNOSIS — Z452 Encounter for adjustment and management of vascular access device: Secondary | ICD-10-CM

## 2012-04-26 LAB — CBC WITH DIFFERENTIAL/PLATELET
Basophils Absolute: 0 10*3/uL (ref 0.0–0.1)
Eosinophils Absolute: 0.3 10*3/uL (ref 0.0–0.5)
HCT: 43.4 % (ref 34.8–46.6)
HGB: 14.3 g/dL (ref 11.6–15.9)
MCV: 102 fL — ABNORMAL HIGH (ref 79.5–101.0)
MONO%: 19.1 % — ABNORMAL HIGH (ref 0.0–14.0)
NEUT#: 3.8 10*3/uL (ref 1.5–6.5)
NEUT%: 56.1 % (ref 38.4–76.8)
Platelets: 225 10*3/uL (ref 145–400)
RDW: 17.2 % — ABNORMAL HIGH (ref 11.2–14.5)

## 2012-04-26 MED ORDER — HEPARIN SOD (PORK) LOCK FLUSH 100 UNIT/ML IV SOLN
500.0000 [IU] | Freq: Once | INTRAVENOUS | Status: AC
  Administered 2012-04-26: 500 [IU] via INTRAVENOUS
  Filled 2012-04-26: qty 5

## 2012-04-26 MED ORDER — SODIUM CHLORIDE 0.9 % IJ SOLN
10.0000 mL | INTRAMUSCULAR | Status: DC | PRN
  Administered 2012-04-26: 10 mL via INTRAVENOUS
  Filled 2012-04-26: qty 10

## 2012-04-26 NOTE — Telephone Encounter (Signed)
RECEIVED A FAX FROM [facility] CONCERNING A CONFIRMATION OF PRESCRIPTION SHIPMENT ON 04/23/12.

## 2012-05-07 ENCOUNTER — Other Ambulatory Visit: Payer: Self-pay | Admitting: *Deleted

## 2012-05-07 NOTE — Telephone Encounter (Signed)
THIS REFILL REQUEST FOR REVLIMID WAS PLACED IN DR.MURINSON'S NURSE'S BOX. 

## 2012-05-10 ENCOUNTER — Other Ambulatory Visit: Payer: Self-pay | Admitting: *Deleted

## 2012-05-10 DIAGNOSIS — C9000 Multiple myeloma not having achieved remission: Secondary | ICD-10-CM

## 2012-05-10 MED ORDER — LENALIDOMIDE 10 MG PO CAPS: ORAL_CAPSULE | ORAL | Status: DC

## 2012-05-24 ENCOUNTER — Other Ambulatory Visit: Payer: Self-pay

## 2012-05-24 ENCOUNTER — Other Ambulatory Visit (HOSPITAL_BASED_OUTPATIENT_CLINIC_OR_DEPARTMENT_OTHER): Payer: Medicare Other | Admitting: Lab

## 2012-05-24 DIAGNOSIS — C9000 Multiple myeloma not having achieved remission: Secondary | ICD-10-CM

## 2012-05-24 LAB — CBC WITH DIFFERENTIAL/PLATELET
Basophils Absolute: 0.1 10*3/uL (ref 0.0–0.1)
Eosinophils Absolute: 0.2 10*3/uL (ref 0.0–0.5)
LYMPH%: 20.8 % (ref 14.0–49.7)
MCH: 32.5 pg (ref 25.1–34.0)
MCV: 99.5 fL (ref 79.5–101.0)
MONO%: 22.7 % — ABNORMAL HIGH (ref 0.0–14.0)
NEUT#: 3.5 10*3/uL (ref 1.5–6.5)
Platelets: 179 10*3/uL (ref 145–400)
RBC: 4.41 10*6/uL (ref 3.70–5.45)

## 2012-05-24 MED ORDER — DEXAMETHASONE 4 MG PO TABS: ORAL_TABLET | ORAL | Status: DC

## 2012-05-24 MED ORDER — ALLOPURINOL 100 MG PO TABS: 200.0000 mg | ORAL_TABLET | Freq: Every day | ORAL | Status: DC

## 2012-05-24 MED ORDER — PROMETHAZINE HCL 25 MG PO TABS: 25.0000 mg | ORAL_TABLET | Freq: Four times a day (QID) | ORAL | Status: DC | PRN

## 2012-05-24 NOTE — Telephone Encounter (Signed)
S/w theresa about immediate refills of allopurinol and phenergan because pt is out. Those were e-scribed to [facility] [place] per Dr Arline Asp. Aggie Cosier also stated pt is changing her mail order company to [employer]. I called [employer] [facility] and talked with Ramona. Ramona stated the easiest is if we e-scribe her meds. I spoke with Dr Arline Asp and clarified he will re-order the allopurinal, the dexamethasone and the revlimid. I called Aggie Cosier back and let her know this information and that the pt will need to get her PCP and other doctors involved in re-ordering the other meds the pt uses. Aggie Cosier expressed understanding.

## 2012-06-04 ENCOUNTER — Other Ambulatory Visit: Payer: Self-pay | Admitting: *Deleted

## 2012-06-04 DIAGNOSIS — C9000 Multiple myeloma not having achieved remission: Secondary | ICD-10-CM

## 2012-06-04 MED ORDER — LENALIDOMIDE 10 MG PO CAPS: ORAL_CAPSULE | ORAL | Status: DC

## 2012-06-04 NOTE — Telephone Encounter (Signed)
THIS REFILL REQUEST FOR REVLIMID WAS GIVEN TO DR.MURINSON'S NURSE, ROBIN BASS,RN. 

## 2012-06-04 NOTE — Telephone Encounter (Signed)
RECEIVED A FAX FROM [facility] CONCERNING A CONFIRMATION OF FACSIMILE RECEIPT FOR PT. REFERRAL. 

## 2012-06-04 NOTE — Telephone Encounter (Signed)
Called patient to remind her to do her [employer] survey. She will have her grand daughter complete it for her.

## 2012-06-07 NOTE — Telephone Encounter (Signed)
[facility] today.  Letter mailed to patient.  

## 2012-06-08 NOTE — Telephone Encounter (Signed)
RECEIVED A FAX FROM [facility] CONCERNING A CONFIRMATION OF PRESCRIPTION SHIPMENT FOR REVLIMID ON 06/07/12. 

## 2012-06-21 ENCOUNTER — Other Ambulatory Visit (HOSPITAL_BASED_OUTPATIENT_CLINIC_OR_DEPARTMENT_OTHER): Payer: Medicare Other | Admitting: Lab

## 2012-06-21 ENCOUNTER — Ambulatory Visit (HOSPITAL_BASED_OUTPATIENT_CLINIC_OR_DEPARTMENT_OTHER): Payer: Medicare Other | Admitting: Oncology

## 2012-06-21 ENCOUNTER — Encounter: Payer: Self-pay | Admitting: Oncology

## 2012-06-21 ENCOUNTER — Telehealth: Payer: Self-pay | Admitting: Oncology

## 2012-06-21 VITALS — BP 124/94 | HR 92 | Temp 96.9°F | Resp 22 | Ht 64.0 in | Wt 156.7 lb

## 2012-06-21 DIAGNOSIS — C9000 Multiple myeloma not having achieved remission: Secondary | ICD-10-CM

## 2012-06-21 LAB — LACTATE DEHYDROGENASE (CC13): LDH: 222 U/L (ref 125–245)

## 2012-06-21 LAB — COMPREHENSIVE METABOLIC PANEL (CC13)
Albumin: 3 g/dL — ABNORMAL LOW (ref 3.5–5.0)
CO2: 29 mEq/L (ref 22–29)
Calcium: 9.5 mg/dL (ref 8.4–10.4)
Glucose: 98 mg/dl (ref 70–99)
Potassium: 3.6 mEq/L (ref 3.5–5.1)
Sodium: 146 mEq/L — ABNORMAL HIGH (ref 136–145)
Total Protein: 5.5 g/dL — ABNORMAL LOW (ref 6.4–8.3)

## 2012-06-21 LAB — CBC WITH DIFFERENTIAL/PLATELET
BASO%: 0.9 % (ref 0.0–2.0)
LYMPH%: 24.9 % (ref 14.0–49.7)
MCHC: 33.1 g/dL (ref 31.5–36.0)
MCV: 99.8 fL (ref 79.5–101.0)
MONO%: 23 % — ABNORMAL HIGH (ref 0.0–14.0)
Platelets: 195 10*3/uL (ref 145–400)
RBC: 4.45 10*6/uL (ref 3.70–5.45)
WBC: 7.3 10*3/uL (ref 3.9–10.3)

## 2012-06-21 MED ORDER — HEPARIN SOD (PORK) LOCK FLUSH 100 UNIT/ML IV SOLN
500.0000 [IU] | Freq: Once | INTRAVENOUS | Status: AC
  Administered 2012-06-21: 500 [IU] via INTRAVENOUS
  Filled 2012-06-21: qty 5

## 2012-06-21 MED ORDER — SODIUM CHLORIDE 0.9 % IJ SOLN
10.0000 mL | INTRAMUSCULAR | Status: DC | PRN
  Administered 2012-06-21: 10 mL via INTRAVENOUS
  Filled 2012-06-21: qty 10

## 2012-06-21 NOTE — Progress Notes (Signed)
This office note has been dictated.  #[id number]

## 2012-06-21 NOTE — Telephone Encounter (Signed)
Gave pt appt for March, April, MAy and June 2014 lab and MD

## 2012-06-22 NOTE — Progress Notes (Signed)
CC:   [name], M.D. [name]. [name], M.D.  PROBLEM LIST:  1. Kappa light chain multiple myeloma diagnosed in April 2010 with 51%  plasma cells in the marrow. Kappa light chains evident on serum  and urine immunofixation electrophoresis and 8.3 gm of protein in  the urine. FISH studies showed 13q minus and 11:14 translocation  indicative of a poor prognosis. The patient initially was treated  with Velcade and Decadron from mid-May 2010 through mid-May 2011  with an excellent response. She also received Zometa from  09/15/08 to 11/01/10. The patient also has been receiving Revlimid since June 2010 and has continued on Revlimid and Decadron  since that time in a program as described above. She has had an  excellent response to treatment. Last metastatic bone survey was  on 12/24/2011 and showed no significant changes when compared with  the prior study of 07/20/2011. There were a few lytic lesions, but  no evidence for progression. The patient's last 24-hour urine  collection was on 12/24/2011 and revealed 58 mg of protein.  The UIFE was negative for monoclonal light chains. We  have not repeated the bone marrow since the initial bone marrow of April  2010 which showed 51% plasma cells.  2. History of congestive heart failure with coronary artery disease  status post cardiac catheterizations in 2009.  3. History of diverticulosis and lower GI bleed in May 2010.  4. History of multiple compression fractures with balloon kyphoplasty  involving T11, T12, L1, on 08/17/2008.  5. Hypertension.  6. Diabetes mellitus.  7. Dyslipidemia.  8. Hiatal hernia.  9. Severe degenerative joint disease.  10.Vitamin D deficiency.  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  12.Chronic diarrhea occurring 5-6 times a day, 3-4 days a week, associated  with some incontinence. The patient states that diarrhea has been  ongoing since the time of her myeloma  diagnosis which actually dates  back 3 years. Apparently, the symptom is getting worse.  13.Right-sided Port-A-Cath placed on 04/09/2009.   MEDICATIONS:  Reviewed and recorded.   Current Outpatient Prescriptions  Medication Sig Dispense Refill  . allopurinol (ZYLOPRIM) 100 MG tablet Take 2 tablets (200 mg total) by mouth daily.  180 tablet  3  . aspirin 81 MG tablet Take 81 mg by mouth daily.        Marland Kitchen atenolol (TENORMIN) 50 MG tablet Take 50 mg by mouth daily.        . Attapulgite (KAOPECTATE PO) Take by mouth as directed.      . calcium citrate-vitamin D (CITRACAL+D) 315-200 MG-UNIT per tablet Take 1 tablet by mouth 2 (two) times daily.       . cholecalciferol (VITAMIN D) 1000 UNITS tablet Take 1,000 Units by mouth daily.        . citalopram (CELEXA) 10 MG tablet TAKE 1 TABLET DAILY.  30 tablet  3  . dexamethasone (DECADRON) 4 MG tablet Take 2 tabs po weekly  24 tablet  3  . diphenoxylate-atropine (LOMOTIL) 2.5-0.025 MG per tablet Take 1 tablet by mouth 2 (two) times daily.  60 tablet  1  . ferrous gluconate (FERGON) 246 (28 FE) MG tablet Take 65 mg by mouth 2 (two) times daily. May be ferrous sulfate.... Will check and bring next time       . guaiFENesin (MUCINEX) 600 MG 12 hr tablet Take 1,200 mg by mouth as needed.        Marland Kitchen HYDROcodone-acetaminophen (NORCO) 10-325 MG  per tablet Take 0.5 tablets by mouth as needed for pain.  60 tablet  2  . lenalidomide (REVLIMID) 10 MG capsule TAKE ONE CAPSULE DAILY  21 capsule  0  . Loperamide HCl (IMODIUM PO) Take by mouth as directed.      Marland Kitchen omeprazole (PRILOSEC) 20 MG capsule Take 20 mg by mouth daily as needed.       . ondansetron (ZOFRAN) 8 MG tablet TAKE 1 TABLET BY MOUTH EVERY 12 HOURS AS NEEDED FOR NAUSEA  20 tablet  0  . potassium chloride (K-DUR) 10 MEQ tablet Take 10 mEq by mouth 2 (two) times daily. Take 2 tabs twice a day      . promethazine (PHENERGAN) 25 MG suppository Place 1 suppository (25 mg total) rectally every 6 (six) hours as  needed.  12 each  1  . promethazine (PHENERGAN) 25 MG tablet Take 1 tablet (25 mg total) by mouth every 6 (six) hours as needed.  30 tablet  3  . simvastatin (ZOCOR) 40 MG tablet Take 40 mg by mouth at bedtime.        . temazepam (RESTORIL) 30 MG capsule Take 1 capsule (30 mg total) by mouth at bedtime as needed.  30 capsule  2   No current facility-administered medications for this visit.   -Revlimid 10 mg daily 3 weeks out of every 4 weeks.  Revlimid was started in June 2010.    -Decadron 8 mg weekly.  The patient takes this every Thursday.  The patient has been on oral Decadron since June 2010.   IMMUNIZATIONS: Pneumovax was given in May 2010.  Flu shot was given on 03/25/2011.   SMOKING HISTORY:  Patient has never smoked cigarettes.   HISTORY:  Lindsey Dalton is seen today for followup of her kappa light chain multiple myeloma.  She is accompanied today by her friend, Gigi Gin. Ms. Hilleary was last seen by [place] on 03/29/2012 and prior to that on 01/19/2012.  She has continued to do extremely well.  There has been no significant change in her condition over the past few months.  She continues to take Revlimid 10 mg daily 3 weeks on, 1 week off.  She started a new cycle just yesterday.  She takes Decadron 8 mg weekly. The patient states that her appetite is poor.  I note that her weight does fluctuate up and down but in general has been more or less stable. The patient is without any new complaints today.  She does have some low back pain for which she takes hydrocodone 10 mg, usually twice a day.  PHYSICAL EXAM:  Ms. Orzechowski looks well.  She just celebrated her 89th birthday.  Weight is 156, 11.2 ounces.  Height 5 feet 4 inches.  Body surface area 1.79 sq m.  Blood pressure 124/94.  Other vital signs are normal.  Patient's weight 3 months ago was 165 pounds and 160 pounds back in early September.  There is no scleral icterus.  Mouth and pharynx are benign.  No peripheral  adenopathy palpable.  The patient has bibasilar rales.  Cardiac:  Regular rhythm without murmur or rub. Breasts are not examined.  The patient has a right-sided Port-A-Cath. It will be flushed with heparin today.  We are flushing this with heparin every 2 months.  Abdomen:  Benign.  Both ankles are edematous 1 to 2+.  No musculoskeletal tenderness.  Neurologic exam is nonfocal.  LABORATORY DATA:  Today, white count 7.3, ANC. 3.6, hemoglobin 14.7, hematocrit 44.4,  platelets 195,000.  Chemistries today notable for an albumin of 3.0, AST 69, ALT 75.  BUN 13.1, creatinine 0.9.  LDH 222. Urine protein to creatinine ratio is pending.  Urine protein to creatinine ratio on 03/29/2012 was 0.08.  Vitamin D level from 03/29/2012 was 38.  IMAGING STUDIES:  1. Metastatic bone survey carried out on 07/20/2009  showed osteopenia. There were some small lucent lesions within the  skull consistent with a either venous lakes or multiple myeloma  deposits. There were numerous wedge compression fractures and  degenerative changes throughout the spine. There was a wedge  compression of L3 of indeterminate age.  2. Chest x-ray, 2-view, from  07/25/2010 showed no active cardiopulmonary disease.  3. Metastatic bone  survey from 09/05/2010 showed unchanged osseous survey with a few lytic  lesions that were seen in the skull, possibly the right femur at the  junction of the middle and distal thirds. There were compression  fractures of the thoracic and lumbar spine unchanged. The patient is  status post vertebroplasty at T11, T12, and L1. A small lytic lesion  was seen in the scapula. There was a degenerative cyst in the greater  tuberosity of the left humerus.  4. Metastatic bone survey on 07/28/2011 showed minimal progression of small  lytic lesions in the skull compatible with a history of multiple  myeloma. There was no significant change in the small lytic lesions in  left scapula. There was probably  better visualization of small lytic  lesions in the proximal left femur and poor visualization of a small  lytic lesion in the distal right femur. There was stable diffuse  osteopenia and multiple vertebral compression fractures.  5. Metastatic bone survey from 12/24/2011 showed multiple compression  fractures in the spine which are stable. There were tiny lytic  lesions seen in the humeri and in the left clavicle as well as the  left scapula. These were felt to be stable. No impending  pathologic fractures.  6. Chest x-ray, 2 view, from 12/24/2011 showed no acute disease.    IMPRESSION/PLAN:  Ms. Tegtmeyer continues to do well on the current program.  It will be recalled that her last evaluation was back in mid August with a 24-hour urine collection and a metastatic bone survey.  We will make no changes in the patient's program.  Her Port-A-Cath will be flushed with heparin today.  We will continue to check CBC every month. Her Port-A-Cath will need to be flushed in 2 months.  We will plan to see Ms. Darrington again in 4 months, which will be around June 10th, at which time we will check CBC, chemistries, quantitative immunoglobulins, serum light chains, and another urine protein to creatinine ratio.  Port- A-Cath will need to be flushed at that time.  At some point towards the late summer or fall of this year, we will again re-evaluate the patient with a 24-hour urine collection and a metastatic bone survey.    ______________________________ Samul Dada, M.D. DSM/MEDQ  D:  06/21/2012  T:  06/22/2012  Job:  [id number]

## 2012-06-24 ENCOUNTER — Telehealth: Payer: Self-pay | Admitting: Oncology

## 2012-06-24 NOTE — Telephone Encounter (Signed)
cx flush appt 5/5 but pt will still have lb. Added flush appt after lb on 4/7 and again on 6/5 after f/u appt. Per 2/10 pof lb is every month and flush appts every 2 months. Last lb and flush appts were 2/10. All of pt appt start times remain the same. Added comment to 3/10 appt to send pt for new schedule.

## 2012-07-02 ENCOUNTER — Other Ambulatory Visit: Payer: Self-pay | Admitting: *Deleted

## 2012-07-02 DIAGNOSIS — C9000 Multiple myeloma not having achieved remission: Secondary | ICD-10-CM

## 2012-07-02 MED ORDER — LENALIDOMIDE 10 MG PO CAPS: ORAL_CAPSULE | ORAL | Status: DC

## 2012-07-02 NOTE — Telephone Encounter (Signed)
RECEIVED A FAX FROM [facility] CONCERNING A CONFIRMATION OF FACSIMILE RECEIPT FOR PT. REFERRAL. 

## 2012-07-02 NOTE — Telephone Encounter (Signed)
THIS REFILL REQUEST FOR REVLIMID WAS GIVEN TO DR.MURINSON'S NURSE, ROBIN BASS,RN. 

## 2012-07-08 NOTE — Telephone Encounter (Signed)
RECEIVED A FAX FROM [facility] CONCERNING A CONFIRMATION OF PRESCRIPTION SHIPMENT FOR REVLIMID ON 07/07/12.

## 2012-07-09 ENCOUNTER — Other Ambulatory Visit: Payer: Self-pay | Admitting: Family

## 2012-07-09 ENCOUNTER — Other Ambulatory Visit: Payer: Self-pay

## 2012-07-09 DIAGNOSIS — C9000 Multiple myeloma not having achieved remission: Secondary | ICD-10-CM

## 2012-07-09 MED ORDER — HYDROCODONE-ACETAMINOPHEN 10-325 MG PO TABS: 1.0000 | ORAL_TABLET | Freq: Three times a day (TID) | ORAL | Status: DC | PRN

## 2012-07-09 MED ORDER — TEMAZEPAM 30 MG PO CAPS: 30.0000 mg | ORAL_CAPSULE | Freq: Every evening | ORAL | Status: DC | PRN

## 2012-07-09 NOTE — Telephone Encounter (Signed)
Lindsey Dalton called earlier for refill on norco and temazepam. rx written and theresa called back to come pick them up. She thanked [place].

## 2012-07-19 ENCOUNTER — Other Ambulatory Visit (HOSPITAL_BASED_OUTPATIENT_CLINIC_OR_DEPARTMENT_OTHER): Payer: Medicare Other | Admitting: Lab

## 2012-07-19 DIAGNOSIS — C9000 Multiple myeloma not having achieved remission: Secondary | ICD-10-CM

## 2012-07-19 LAB — CBC WITH DIFFERENTIAL/PLATELET
Basophils Absolute: 0 10*3/uL (ref 0.0–0.1)
EOS%: 3.2 % (ref 0.0–7.0)
HCT: 44.7 % (ref 34.8–46.6)
HGB: 14.6 g/dL (ref 11.6–15.9)
MCH: 32.8 pg (ref 25.1–34.0)
MONO#: 1.4 10*3/uL — ABNORMAL HIGH (ref 0.1–0.9)
NEUT#: 3.2 10*3/uL (ref 1.5–6.5)
NEUT%: 52.1 % (ref 38.4–76.8)
RDW: 18.3 % — ABNORMAL HIGH (ref 11.2–14.5)
WBC: 6.1 10*3/uL (ref 3.9–10.3)
lymph#: 1.2 10*3/uL (ref 0.9–3.3)

## 2012-08-06 ENCOUNTER — Other Ambulatory Visit: Payer: Self-pay | Admitting: *Deleted

## 2012-08-06 DIAGNOSIS — C9000 Multiple myeloma not having achieved remission: Secondary | ICD-10-CM

## 2012-08-06 NOTE — Telephone Encounter (Signed)
THIS REFILL REQUEST FOR REVLIMID WAS GIVEN TO DR.MURINSON'S NURSE, ROBIN BASS,RN. 

## 2012-08-09 MED ORDER — LENALIDOMIDE 10 MG PO CAPS: ORAL_CAPSULE | ORAL | Status: DC

## 2012-08-09 NOTE — Addendum Note (Signed)
Addended by: Arvilla Meres on: 08/09/2012 11:47 AM   Modules accepted: Orders

## 2012-08-09 NOTE — Telephone Encounter (Signed)
RECEIVED A FAX FROM [facility] CONCERNING A CONFIRMATION OF FACSIMILE RECEIPT FOR PT. REFERRAL. 

## 2012-08-12 NOTE — Telephone Encounter (Signed)
RECEIVED A FAX FROM [facility]CERNING A CONFIRMATION OF PRESCRIPTION SHIPMENT FOR REVLIMID ON 08/10/12.

## 2012-08-16 ENCOUNTER — Other Ambulatory Visit: Payer: Self-pay | Admitting: Medical Oncology

## 2012-08-16 ENCOUNTER — Ambulatory Visit (HOSPITAL_BASED_OUTPATIENT_CLINIC_OR_DEPARTMENT_OTHER): Payer: Medicare Other

## 2012-08-16 ENCOUNTER — Other Ambulatory Visit (HOSPITAL_BASED_OUTPATIENT_CLINIC_OR_DEPARTMENT_OTHER): Payer: Medicare Other | Admitting: Lab

## 2012-08-16 VITALS — BP 134/86 | HR 89 | Temp 98.4°F | Resp 20

## 2012-08-16 DIAGNOSIS — C9000 Multiple myeloma not having achieved remission: Secondary | ICD-10-CM

## 2012-08-16 LAB — CBC WITH DIFFERENTIAL/PLATELET
Basophils Absolute: 0.1 10*3/uL (ref 0.0–0.1)
Eosinophils Absolute: 0.1 10*3/uL (ref 0.0–0.5)
HCT: 43 % (ref 34.8–46.6)
HGB: 13.9 g/dL (ref 11.6–15.9)
MCH: 32.8 pg (ref 25.1–34.0)
MCV: 101 fL (ref 79.5–101.0)
MONO%: 17.4 % — ABNORMAL HIGH (ref 0.0–14.0)
NEUT#: 3.6 10*3/uL (ref 1.5–6.5)
NEUT%: 52.2 % (ref 38.4–76.8)
RDW: 17.5 % — ABNORMAL HIGH (ref 11.2–14.5)
lymph#: 1.9 10*3/uL (ref 0.9–3.3)

## 2012-08-16 MED ORDER — SODIUM CHLORIDE 0.9 % IJ SOLN
10.0000 mL | INTRAMUSCULAR | Status: DC | PRN
  Administered 2012-08-16: 10 mL via INTRAVENOUS
  Filled 2012-08-16: qty 10

## 2012-08-16 MED ORDER — HEPARIN SOD (PORK) LOCK FLUSH 100 UNIT/ML IV SOLN
500.0000 [IU] | Freq: Once | INTRAVENOUS | Status: AC
  Administered 2012-08-16: 500 [IU] via INTRAVENOUS
  Filled 2012-08-16: qty 5

## 2012-08-16 MED ORDER — LENALIDOMIDE 10 MG PO CAPS: ORAL_CAPSULE | ORAL | Status: DC

## 2012-08-16 NOTE — Telephone Encounter (Signed)
Theresa-grand daughter called stating that pt accidentally put her Revlimid in the recycle container. She can not retreive. She called [facility] and they asked her to call us and get a new authorization. New prescription faxed to [facility].

## 2012-08-16 NOTE — Patient Instructions (Signed)
You had your port flushed today, continue to have flushed every 6-8 weeks 

## 2012-08-18 ENCOUNTER — Telehealth: Payer: Self-pay | Admitting: Medical Oncology

## 2012-08-18 NOTE — Telephone Encounter (Signed)
Theresa-grand daughter and [employer] called to let us know that pt's insurance will not cover the lost revlimid. The earliest they can ship to pt will be 08/25/12. Dr. Arline Asp is aware and have pt to start as soon as they will ship. Aggie Cosier voiced understanding.

## 2012-08-25 ENCOUNTER — Telehealth: Payer: Self-pay

## 2012-08-25 NOTE — Telephone Encounter (Signed)
Aggie Cosier called stating revlimid will be delivered tomorrow. She was confirming pt should start revlimid right away. Per Karin Lieu phone message 4/9 pt is to start revlimid right away. Aggie Cosier expressed understanding

## 2012-08-26 NOTE — Telephone Encounter (Signed)
RECEIVED A FAX FROM [facility]ERNING A CONFIRMATION OF PRESCRIPTION SHIPMENT FOR REVLIMID ON 08/25/12.

## 2012-09-13 ENCOUNTER — Other Ambulatory Visit (HOSPITAL_BASED_OUTPATIENT_CLINIC_OR_DEPARTMENT_OTHER): Payer: Medicare Other | Admitting: Lab

## 2012-09-13 DIAGNOSIS — C9000 Multiple myeloma not having achieved remission: Secondary | ICD-10-CM

## 2012-09-13 LAB — CBC WITH DIFFERENTIAL/PLATELET
BASO%: 0.3 % (ref 0.0–2.0)
LYMPH%: 25.2 % (ref 14.0–49.7)
MCHC: 32.9 g/dL (ref 31.5–36.0)
MONO#: 1.2 10*3/uL — ABNORMAL HIGH (ref 0.1–0.9)
Platelets: 163 10*3/uL (ref 145–400)
RBC: 4.49 10*6/uL (ref 3.70–5.45)
RDW: 17.4 % — ABNORMAL HIGH (ref 11.2–14.5)
WBC: 6.2 10*3/uL (ref 3.9–10.3)

## 2012-09-15 ENCOUNTER — Other Ambulatory Visit: Payer: Self-pay | Admitting: *Deleted

## 2012-09-15 DIAGNOSIS — C9000 Multiple myeloma not having achieved remission: Secondary | ICD-10-CM

## 2012-09-15 MED ORDER — LENALIDOMIDE 10 MG PO CAPS: ORAL_CAPSULE | ORAL | Status: DC

## 2012-09-20 NOTE — Telephone Encounter (Signed)
RECEIVED A FAX FROM [facility] CONCERNING A CONFIRMATION OF PRESCRIPTION SHIPMENT FOR REVLIMID ON 05/16/1925.

## 2012-10-14 ENCOUNTER — Other Ambulatory Visit: Payer: Self-pay

## 2012-10-14 ENCOUNTER — Other Ambulatory Visit: Payer: Self-pay | Admitting: *Deleted

## 2012-10-14 ENCOUNTER — Ambulatory Visit (HOSPITAL_BASED_OUTPATIENT_CLINIC_OR_DEPARTMENT_OTHER): Payer: Medicare Other | Admitting: Lab

## 2012-10-14 ENCOUNTER — Ambulatory Visit (HOSPITAL_BASED_OUTPATIENT_CLINIC_OR_DEPARTMENT_OTHER): Payer: Medicare Other | Admitting: Oncology

## 2012-10-14 ENCOUNTER — Ambulatory Visit: Payer: Medicare Other

## 2012-10-14 ENCOUNTER — Telehealth: Payer: Self-pay | Admitting: Oncology

## 2012-10-14 ENCOUNTER — Ambulatory Visit (HOSPITAL_BASED_OUTPATIENT_CLINIC_OR_DEPARTMENT_OTHER): Payer: Medicare Other

## 2012-10-14 ENCOUNTER — Encounter: Payer: Self-pay | Admitting: Oncology

## 2012-10-14 VITALS — BP 92/64 | HR 70 | Temp 97.1°F | Ht 64.0 in | Wt 149.1 lb

## 2012-10-14 DIAGNOSIS — R197 Diarrhea, unspecified: Secondary | ICD-10-CM

## 2012-10-14 DIAGNOSIS — C9000 Multiple myeloma not having achieved remission: Secondary | ICD-10-CM

## 2012-10-14 DIAGNOSIS — R0600 Dyspnea, unspecified: Secondary | ICD-10-CM

## 2012-10-14 DIAGNOSIS — R0609 Other forms of dyspnea: Secondary | ICD-10-CM

## 2012-10-14 DIAGNOSIS — C9002 Multiple myeloma in relapse: Secondary | ICD-10-CM

## 2012-10-14 DIAGNOSIS — R0602 Shortness of breath: Secondary | ICD-10-CM

## 2012-10-14 LAB — COMPREHENSIVE METABOLIC PANEL
Albumin: 3.2 g/dL — ABNORMAL LOW (ref 3.5–5.2)
Alkaline Phosphatase: 64 U/L (ref 39–117)
Calcium: 9.1 mg/dL (ref 8.4–10.5)
Chloride: 103 mEq/L (ref 96–112)
Glucose, Bld: 149 mg/dL — ABNORMAL HIGH (ref 70–99)
Potassium: 2.8 mEq/L — ABNORMAL LOW (ref 3.5–5.3)
Sodium: 139 mEq/L (ref 135–145)
Total Protein: 5.9 g/dL — ABNORMAL LOW (ref 6.0–8.3)

## 2012-10-14 LAB — CBC WITH DIFFERENTIAL/PLATELET
BASO%: 0.3 % (ref 0.0–2.0)
HCT: 48.7 % — ABNORMAL HIGH (ref 34.8–46.6)
MCHC: 33.9 g/dL (ref 31.5–36.0)
MONO#: 0.6 10*3/uL (ref 0.1–0.9)
NEUT#: 5.1 10*3/uL (ref 1.5–6.5)
RBC: 4.97 10*6/uL (ref 3.70–5.45)
WBC: 6.8 10*3/uL (ref 3.9–10.3)
lymph#: 0.7 10*3/uL — ABNORMAL LOW (ref 0.9–3.3)
nRBC: 0 % (ref 0–0)

## 2012-10-14 MED ORDER — SODIUM CHLORIDE 0.9 % IJ SOLN
10.0000 mL | INTRAMUSCULAR | Status: DC | PRN
  Administered 2012-10-14: 10 mL via INTRAVENOUS
  Filled 2012-10-14: qty 10

## 2012-10-14 MED ORDER — SODIUM CHLORIDE 0.9 % IV SOLN
INTRAVENOUS | Status: AC
  Administered 2012-10-14: 16:00:00 via INTRAVENOUS

## 2012-10-14 MED ORDER — HEPARIN SOD (PORK) LOCK FLUSH 100 UNIT/ML IV SOLN
500.0000 [IU] | Freq: Once | INTRAVENOUS | Status: AC
  Administered 2012-10-14: 500 [IU] via INTRAVENOUS
  Filled 2012-10-14: qty 5

## 2012-10-14 MED ORDER — LENALIDOMIDE 10 MG PO CAPS: ORAL_CAPSULE | ORAL | Status: DC

## 2012-10-14 MED ORDER — ONDANSETRON 8 MG/50ML IVPB (CHCC)
8.0000 mg | Freq: Once | INTRAVENOUS | Status: AC
  Administered 2012-10-14: 8 mg via INTRAVENOUS

## 2012-10-14 NOTE — Telephone Encounter (Signed)
gv and printeda ppt sched and avs for pt....pt picked up container

## 2012-10-14 NOTE — Patient Instructions (Addendum)
Dehydration, Adult Dehydration is when you lose more fluids from the body than you take in. Vital organs like the kidneys, brain, and heart cannot function without a proper amount of fluids and salt. Any loss of fluids from the body can cause dehydration.  CAUSES   Vomiting.  Diarrhea.  Excessive sweating.  Excessive urine output.  Fever. SYMPTOMS  Mild dehydration  Thirst.  Dry lips.  Slightly dry mouth. Moderate dehydration  Very dry mouth.  Sunken eyes.  Skin does not bounce back quickly when lightly pinched and released.  Dark urine and decreased urine production.  Decreased tear production.  Headache. Severe dehydration  Very dry mouth.  Extreme thirst.  Rapid, weak pulse (more than 100 beats per minute at rest).  Cold hands and feet.  Not able to sweat in spite of heat and temperature.  Rapid breathing.  Blue lips.  Confusion and lethargy.  Difficulty being awakened.  Minimal urine production.  No tears. DIAGNOSIS  Your caregiver will diagnose dehydration based on your symptoms and your exam. Blood and urine tests will help confirm the diagnosis. The diagnostic evaluation should also identify the cause of dehydration. TREATMENT  Treatment of mild or moderate dehydration can often be done at home by increasing the amount of fluids that you drink. It is best to drink small amounts of fluid more often. Drinking too much at one time can make vomiting worse. Refer to the home care instructions below. Severe dehydration needs to be treated at the hospital where you will probably be given intravenous (IV) fluids that contain water and electrolytes. HOME CARE INSTRUCTIONS   Ask your caregiver about specific rehydration instructions.  Drink enough fluids to keep your urine clear or pale yellow.  Drink small amounts frequently if you have nausea and vomiting.  Eat as you normally do.  Avoid:  Foods or drinks high in sugar.  Carbonated  drinks.  Juice.  Extremely hot or cold fluids.  Drinks with caffeine.  Fatty, greasy foods.  Alcohol.  Tobacco.  Overeating.  Gelatin desserts.  Wash your hands well to avoid spreading bacteria and viruses.  Only take over-the-counter or prescription medicines for pain, discomfort, or fever as directed by your caregiver.  Ask your caregiver if you should continue all prescribed and over-the-counter medicines.  Keep all follow-up appointments with your caregiver. SEEK MEDICAL CARE IF:  You have abdominal pain and it increases or stays in one area (localizes).  You have a rash, stiff neck, or severe headache.  You are irritable, sleepy, or difficult to awaken.  You are weak, dizzy, or extremely thirsty. SEEK IMMEDIATE MEDICAL CARE IF:   You are unable to keep fluids down or you get worse despite treatment.  You have frequent episodes of vomiting or diarrhea.  You have blood or green matter (bile) in your vomit.  You have blood in your stool or your stool looks black and tarry.  You have not urinated in 6 to 8 hours, or you have only urinated a small amount of very dark urine.  You have a fever.  You faint. MAKE SURE YOU:   Understand these instructions.  Will watch your condition.  Will get help right away if you are not doing well or get worse. Document Released: 04/28/2005 Document Revised: 07/21/2011 Document Reviewed: 12/16/2010 [employer] Patient Information 2014 [employer].  

## 2012-10-14 NOTE — Addendum Note (Signed)
Addended by: Arvilla Meres on: 10/14/2012 04:18 PM   Modules accepted: Orders

## 2012-10-14 NOTE — Telephone Encounter (Signed)
RECEIVED A FAX FROM [facility] CONCERNING A CONFIRMATION OF FACSIMILE RECEIPT FOR PT. REFERRAL. 

## 2012-10-14 NOTE — Telephone Encounter (Signed)
THIS REFILL REQUEST FOR REVLIMID WAS GIVEN TO DR.MURINSON'S NURSE, KATHY BUYCK,RN. 

## 2012-10-14 NOTE — Progress Notes (Signed)
Patient receiving IVF's in infusion so no need for port flush.

## 2012-10-14 NOTE — Progress Notes (Signed)
This office note has been dictated.  #[id number]

## 2012-10-15 ENCOUNTER — Telehealth: Payer: Self-pay

## 2012-10-15 LAB — IGG, IGA, IGM
IgA: 72 mg/dL (ref 69–380)
IgM, Serum: 12 mg/dL — ABNORMAL LOW (ref 52–322)

## 2012-10-15 NOTE — Progress Notes (Signed)
CC:   [name], M.D. Lindsey Dalton. [name], M.D. [name], MD, Fax [phone]  PROBLEM LIST:  1. Kappa light chain multiple myeloma diagnosed in April 2010 with 51%  plasma cells in the marrow. Kappa light chains evident on serum  and urine immunofixation electrophoresis and 8.3 gm of protein in  the urine. FISH studies showed 13q minus and 11:14 translocation  indicative of a poor prognosis. The patient initially was treated  with Velcade and Decadron from mid-May 2010 through mid-May 2011  with an excellent response. She also received Zometa from  09/15/08 to 11/01/10. The patient also has been receiving Revlimid since June 2010 and has continued on Revlimid and Decadron  since that time in a program as described above. She has had an  excellent response to treatment. Last metastatic bone survey was  on 12/24/2011 and showed no significant changes when compared with  the prior study of 07/20/2011. There were a few lytic lesions, but  no evidence for progression. The patient's last 24-hour urine  collection was on 12/24/2011 and revealed 58 mg of protein.  The UIFE was negative for monoclonal light chains. We  have not repeated the bone marrow since the initial bone marrow of April  2010 which showed 51% plasma cells.  2. History of congestive heart failure with coronary artery disease  status post cardiac catheterizations in 2009.  3. History of diverticulosis and lower GI bleed in May 2010.  4. History of multiple compression fractures with balloon kyphoplasty  involving T11, T12, L1, on 08/17/2008.  5. Hypertension.    6. Dyslipidemia.  7. Hiatal hernia.  8. Severe degenerative joint disease.  9. Vitamin D deficiency.  10. 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  11. Chronic diarrhea occurring 5-6 times a day, 3-4 days a week, associated  with some incontinence. The patient states that diarrhea has been  ongoing since the time of her  myeloma diagnosis which actually dates  back 3 years. Apparently, the symptom is getting worse.  12. Right-sided Port-A-Cath placed on 04/09/2009.   MEDICATIONS:  Reviewed and recorded. Current Outpatient Prescriptions  Medication Sig Dispense Refill  . allopurinol (ZYLOPRIM) 100 MG tablet Take 2 tablets (200 mg total) by mouth daily.  180 tablet  3  . aspirin 81 MG tablet Take 81 mg by mouth daily.        Marland Kitchen atenolol (TENORMIN) 50 MG tablet Take 50 mg by mouth daily.        . Attapulgite (KAOPECTATE PO) Take by mouth as directed.      . calcium citrate-vitamin D (CITRACAL+D) 315-200 MG-UNIT per tablet Take 1 tablet by mouth 2 (two) times daily.       . cholecalciferol (VITAMIN D) 1000 UNITS tablet Take 1,000 Units by mouth daily.        . citalopram (CELEXA) 10 MG tablet TAKE 1 TABLET DAILY.  30 tablet  3  . dexamethasone (DECADRON) 4 MG tablet Take 2 tabs po weekly  24 tablet  3  . diphenoxylate-atropine (LOMOTIL) 2.5-0.025 MG per tablet Take 1 tablet by mouth 2 (two) times daily.  60 tablet  1  . ferrous gluconate (FERGON) 246 (28 FE) MG tablet Take 65 mg by mouth 2 (two) times daily. May be ferrous sulfate.... Will check and bring next time       . guaiFENesin (MUCINEX) 600 MG 12 hr tablet Take 1,200 mg by mouth as needed.        Marland Kitchen  HYDROcodone-acetaminophen (NORCO) 10-325 MG per tablet Take 1 tablet by mouth every 8 (eight) hours as needed for pain.  60 tablet  2  . Loperamide HCl (IMODIUM PO) Take by mouth as directed.      Marland Kitchen omeprazole (PRILOSEC) 20 MG capsule Take 20 mg by mouth daily as needed.       . ondansetron (ZOFRAN) 8 MG tablet TAKE 1 TABLET BY MOUTH EVERY 12 HOURS AS NEEDED FOR NAUSEA  20 tablet  0  . potassium chloride (K-DUR) 10 MEQ tablet Take 10 mEq by mouth 2 (two) times daily. Take 2 tabs twice a day      . promethazine (PHENERGAN) 25 MG suppository Place 1 suppository (25 mg total) rectally every 6 (six) hours as needed.  12 each  1  . promethazine (PHENERGAN) 25 MG  tablet Take 1 tablet (25 mg total) by mouth every 6 (six) hours as needed.  30 tablet  3  . simvastatin (ZOCOR) 40 MG tablet Take 40 mg by mouth at bedtime.        . temazepam (RESTORIL) 30 MG capsule Take 1 capsule (30 mg total) by mouth at bedtime as needed for sleep.  30 capsule  2  . lenalidomide (REVLIMID) 10 MG capsule TAKE ONE CAPSULE DAILY  21 capsule  0   No current facility-administered medications for this visit.     TREATMENT PROGRAM: -Revlimid 10 mg daily 3 weeks out of every 4 weeks. Revlimid was started in June 2010.   -Decadron 8 mg weekly. The patient takes this every Thursday. The patient has been on oral Decadron since June 2010.   -Zometa 3 mg about every 3 months from 09/15/2008 through 11/01/2010. 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   IMMUNIZATIONS:  Pneumovax was given in May 2010.  Flu shot was given on 03/25/2011.    SMOKING HISTORY: Patient has never smoked cigarettes.   HISTORY:  I am seeing Lindsey Dalton today for followup of her kappa light chain multiple myeloma.  She is accompanied by her granddaughter, Lindsey Dalton.  The patient was last seen by [place] on 06/21/2012, at which time she seemed to be doing fairly well, except for the problem with diarrhea.  She had seen Dr. Lina Sar a few months ago and Dr. Juanda Chance had suggested eliminating magnesium from her medication list.  The patient apparently had some improvement from that.  Unfortunately, the patient states that she has been having more severe diarrhea over the last week or 2.  In addition, she has been having nausea and vomiting and difficulty eating.  She continues to lose weight, another 7 pounds over the past 4 months.  The patient's baseline weight seems to be somewhere around 155 pounds, although there do seem to be some fluctuations.  Back in November she weighed 165 pounds and today she weighs 149 pounds.   Another problem that the patient has had is dyspnea on exertion.  She apparently was given oxygen yesterday, is on 2.5 L when she exerts herself.  When she is at rest she is not having any problems.  She underwent workup by her cardiologist a couple of months ago, apparently an exercise test.  The patient had a chest x-ray yesterday when she saw her primary physician, Dr. Link Snuffer.  The patient denies any fever, but says she has chills.  She is not having any swelling or pain in her legs.  She has mild chronic low back pain.  Her multiple myeloma has appeared to be under excellent control.  The last time we checked a 24-hour urine was back in August 2013 and there was only 58 mg of protein and the urine immunofixation electrophoresis was negative for monoclonal light chains.  At the time of diagnosis in April 2010 there were 8.3 g of protein in the urine and the bone marrow was involved with 51% plasma cells.  PHYSICAL EXAMINATION:  General:  The patient is in a wheelchair. Breathing is slightly labored, although she is not on oxygen.  There has been no diarrhea, nausea, or vomiting.  She complains of weakness. Weight is 149 pounds 1.6 ounces.  Height 5 feet 4 inches, body surface area 1.75 sq m.  Vital Signs:  Blood pressure today 92/64, as compared with 124/94 back on June 21, 2012.  The patient has a history of hypertension.  Pulse 70 and regular.  Respirations regular and slightly labored.  O2 saturation on room air at rest was 97%.  HEENT:  There is no scleral icterus.  Mouth and pharynx are benign.  The patient has dentures.  There is no adenopathy palpable.  Lungs:  Some very slight inspiratory rales and slight expiratory wheeze at the bases.  Cardiac: Regular rhythm without murmur or rub.  Abdomen:  Benign with the patient sitting in a wheelchair.  Extremities:  Trace to 1+ edema of both lateral ankles, but no significant pitting elsewhere.  The patient does have a  right-sided Port-A-Cath that will be flushed with heparin today after she receives IV fluids and IV Zofran.  Neurologic:  Exam was nonfocal.  The patient is alert and appropriate.  LABORATORY DATA:  White count 6.8, ANC 5.1, hemoglobin 16.5, hematocrit 48.7, platelets 190,000.  Chemistries are notable for potassium of 2.8, BUN 10, creatinine 1.00, calcium 9.1, albumin 3.2, total protein 5.9 and thus, globulins are 2.7, glucose 149, LDH 212.  ProBNP is pending.  D- dimer on 10/14/2012 was 0.87, which is slightly elevated.  Back on 07/20/2009 D-dimer was 0.50.  Normal range is 0.00-0.48.  Also pending from today are quantitative immunoglobulins and serum light chains.  IMAGING STUDIES:  1. Metastatic bone survey carried out on 07/20/2009  showed osteopenia. There were some small lucent lesions within the  skull consistent with a either venous lakes or multiple myeloma  deposits. There were numerous wedge compression fractures and  degenerative changes throughout the spine. There was a wedge  compression of L3 of indeterminate age.  2. Chest x-ray, 2-view, from  07/25/2010 showed no active cardiopulmonary disease.  3. Metastatic bone  survey from 09/05/2010 showed unchanged osseous survey with a few lytic  lesions that were seen in the skull, possibly the right femur at the  junction of the middle and distal thirds. There were compression  fractures of the thoracic and lumbar spine unchanged. The patient is  status post vertebroplasty at T11, T12, and L1. A small lytic lesion  was seen in the scapula. There was a degenerative cyst in the greater  tuberosity of the left humerus.  4. Metastatic bone survey on 07/28/2011 showed minimal progression of small  lytic lesions in the skull compatible with a history of multiple  myeloma. There was no significant change in the small lytic lesions in  left scapula. There was probably better visualization of small lytic  lesions in the proximal left  femur and poor visualization of a small  lytic lesion in the distal right femur. There was stable diffuse  osteopenia  and multiple vertebral compression fractures.  5. Metastatic bone survey from 12/24/2011 showed multiple compression  fractures in the spine which are stable. There were tiny lytic  lesions seen in the humeri and in the left clavicle as well as the  left scapula. These were felt to be stable. No impending  pathologic fractures.  6. Chest x-ray, 2 view, from 12/24/2011 showed no acute disease. 7. Bone density scan from 10/13/2012 showed a T-score for the lumbar spine total of -0.5.  T-score of the right femoral neck was -2.8 and the T- score for the left femoral neck was -3.2.    IMPRESSION AND PLAN:  There has been a fairly significant change in Mrs. Youkhana' condition since I last saw her on 06/21/2012.  Apparently, the patient has developed increasing diarrhea, some nausea and vomiting in recent weeks.  The dyspnea on exertion is worse and has progressed over several weeks.  I am not sure what workup has been done or is planned.  As stated, the patient does have oxygen 2.5 L/minute which she is not using a today.  Of note today is the fact that her blood pressure is slightly low.  For that reason, we will go ahead and give her 500 mL of normal saline.  We will also give her some Zofran 8 mg IV.  The patient's potassium came back after she had left the clinic area.  We will inform the patient and her primary physician of her low potassium and the need for some potassium replacement.  I do not think the patient is on diuretic therapy.  Her low potassium is probably due to the nausea, vomiting, and diarrhea.  As stated above, the patient has been taking Revlimid 10 mg daily, 3 weeks on 1, week off.  She started her Revlimid around May 16th and thus has I think 1 more day to go.  We are going to have her stop her Revlimid, however, for the next couple of weeks to see  if her diarrhea improves.  Certainly, the diarrhea could in part be due to the Revlimid, although it is difficult to get a good temporal relationship.  The patient also takes Decadron 8 mg weekly, usually on Thursdays.  She took her Decadron today.  The patient had been on Zometa at one time, specifically from 09/15/2008 through 11/01/2010, however Zometa was discontinued because of the development of osteonecrosis of the mandible in September 2012 following dental extractions.  The patient does have osteoporosis of her hips noted on the recent bone density scan from 10/13/2012. Currently the patient is edentulous and has dentures. We will need to consult with her dentist to make sure that it is safe to repeat start Zometa. If so, we will restart Zometa next month.  The patient is taking Kaopectate for diarrhea.  I suggested Imodium.  If the diarrhea does not improve, then the patient may need to see Dr. Juanda Chance again.  As stated, we are stopping the Revlimid.  She has nausea and vomiting for reasons that are unclear.  She was given a prescription for Zofran, but apparently this is too expensive.  She has been taking Compazine.  The patient has a history of congestive heart failure.  We are checking a proBNP today.  The patient may need further workup to include 2-D echocardiogram or perhaps a pulmonary evaluation.  The D-dimer today was not strongly suggestive of pulmonary emboli, although it is slightly elevated.  Certainly, the diagnosis of pulmonary embolism needs to  be considered in any patient with dyspnea of uncertain etiology.  The patient may need to have a CT angiogram of the chest to definitively rule out this possibility.  As stated, we are giving the patient 500 mL of IV fluid today, also Zofran 8 mg IV.  With regard to the patient's multiple myeloma, we think that her disease is under excellent control.  Her urine immunofixation electrophoresis carried out last August  failed to reveal a monoclonal light chain and there was only 58 mg of protein in the urine.  We will have the patient collect another 24-hour urine for protein and urine immunofixation electrophoresis.  I have asked her to return around July 7th, at which time we will check CBC, chemistries.  The patient's Port-A-Cath was used for the IV fluids and therefore, the Port-A-Cath was flushed with heparin today.  If the patient's condition fails to improve or certainly if things get worse, I instructed the patient and her granddaughter Lindsey Dalton to either contact her primary physician, her cardiologist, or Dr. Juanda Chance, her GI physician, or have the patient go to the emergency room for further evaluation.  At this point, it is not clear why she is not doing quite as well as she had been.  I do not think that multiple myeloma is causing these problems.  It is certainly possible that the Revlimid may be causing or contributing to the diarrhea problem.    ______________________________ Samul Dada, M.D. DSM/MEDQ  D:  10/14/2012  T:  10/15/2012  Job:  [id number]

## 2012-10-15 NOTE — Telephone Encounter (Signed)
Per Dr Arline Asp: faxed labs from 10/14/12 to Dr Link Snuffer and called granddaughter. Granddaughter stated that pt was feeling better before she left [facility] and was improved in apetite.

## 2012-10-15 NOTE — Telephone Encounter (Signed)
10/14/12 labs faxed to Dr [name] per Dr Arline Asp

## 2012-10-18 ENCOUNTER — Other Ambulatory Visit: Payer: Medicare Other

## 2012-10-19 ENCOUNTER — Other Ambulatory Visit: Payer: Self-pay | Admitting: Internal Medicine

## 2012-10-19 ENCOUNTER — Telehealth: Payer: Self-pay | Admitting: Medical Oncology

## 2012-10-19 DIAGNOSIS — R0902 Hypoxemia: Secondary | ICD-10-CM

## 2012-10-19 NOTE — Telephone Encounter (Signed)
I called pt to inform her that Dr. Arline Asp would like for her to get a dental consult to see if she can receive zometa.  She had problems with osteonecrosis back in 2012. She will call for a dental consult and I told her if her dentist needs to speak with Dr Arline Asp he can call our office. She voiced understanding.

## 2012-10-21 ENCOUNTER — Telehealth: Payer: Self-pay | Admitting: Oncology

## 2012-10-21 ENCOUNTER — Ambulatory Visit
Admission: RE | Admit: 2012-10-21 | Discharge: 2012-10-21 | Disposition: A | Discharge status: Home / Self Care | Payer: Medicare Other | Source: Ambulatory Visit | Attending: Internal Medicine | Admitting: Internal Medicine

## 2012-10-21 DIAGNOSIS — R0902 Hypoxemia: Secondary | ICD-10-CM

## 2012-10-21 MED ORDER — IOHEXOL 350 MG/ML SOLN
100.0000 mL | Freq: Once | INTRAVENOUS | Status: AC | PRN
  Administered 2012-10-21: 100 mL via INTRAVENOUS

## 2012-10-21 NOTE — Telephone Encounter (Signed)
Moved 7/7 appt to covering provider due to DM's departure. lmonvm for pt re new time for 7/7 @ 3pm lb/fu.

## 2012-10-22 ENCOUNTER — Telehealth: Payer: Self-pay | Admitting: Dietician

## 2012-10-22 NOTE — Telephone Encounter (Signed)
Brief Outpatient Oncology Nutrition Note  Patient has been identified to be at risk on malnutrition screen.  Wt Readings from Last 10 Encounters:  10/14/12 149 lb 1.6 oz (67.631 kg)  06/21/12 156 lb 11.2 oz (71.079 kg)  03/29/12 164 lb 11.2 oz (74.707 kg)  03/01/12 155 lb (70.308 kg)  03/01/12 155 lb (70.308 kg)  01/27/12 157 lb (71.215 kg)  01/19/12 159 lb 12.8 oz (72.485 kg)  12/22/11 164 lb (74.39 kg)  11/24/11 160 lb 11.2 oz (72.893 kg)  08/20/11 161 lb 12.8 oz (73.392 kg)    Called patient regarding continued weight loss.  Patient report very poor appetite.  "I ate some fruit." and drinks 2 Boost daily.  "This is worse that it has ever been."  Instructed patient to eat 5-6 small meals daily and increase Boost to tid.  Patient does not want an appointment with the RD at this time.  Patient to call as needed.  Oran Rein, RD, LDN Clinical Inpatient Dietitian Pager:  [phone] Weekend and after hours pager:  [phone]

## 2012-10-25 ENCOUNTER — Encounter: Payer: Self-pay | Admitting: Oncology

## 2012-10-25 LAB — UIFE/LIGHT CHAINS/TP QN, 24-HR UR
Albumin, U: DETECTED
Alpha 2, Urine: DETECTED — AB
Beta, Urine: DETECTED — AB
Free Lambda Lt Chains,Ur: 0.06 mg/dL (ref 0.02–0.67)
Gamma Globulin, Urine: DETECTED — AB
Volume, Urine: 1400 mL

## 2012-10-25 LAB — PROTEIN / CREATININE RATIO, URINE
Creatinine, Urine: 28.7 mg/dL
Protein Creatinine Ratio: 0.1 (ref <0.15)
Total Protein, Urine: 3 mg/dL

## 2012-10-25 NOTE — Progress Notes (Signed)
A 24-hour urine collection from 10/19/2012 yielded 368 mg of protein, of which 318 mg were free kappa light chains. The urine immunofixation electrophoresis was positive for monoclonal free kappa light chains.  It will be recalled that on 12/24/2011, 24-hour urine protein was 58 mg and the urine immunofixation electrophoresis showed no monoclonal free light chains.   It appears that the patient may be starting to break through Revlimid/Decadron which was started in June 2010. At the time of diagnosis, the patient was excreting over 8 g of protein in the urine.   The patient has an M.D. appointment on 11/15/2012.

## 2012-10-29 ENCOUNTER — Telehealth: Payer: Self-pay

## 2012-10-29 NOTE — Telephone Encounter (Signed)
S/w pt: she is feeling better, she had diarrhea about 2x/day. 1st stool is soft and 2nd stool is watery. He revlimid had been on hold for 2 weeks. She has also started back eating. This information forwarded to Dr Arline Asp. He stated to continue to hold revlimid and he will evaluate this information and make a decision later. Pt informed of plan.

## 2012-10-29 NOTE — Telephone Encounter (Signed)
lvm to find out pt condition. She was having diarrhea, we held revlimid. We need to find out if the diarrhea has subsided. We may restart revlimid after we hear from pt.

## 2012-10-31 ENCOUNTER — Encounter: Payer: Self-pay | Admitting: Oncology

## 2012-10-31 NOTE — Progress Notes (Signed)
This patient underwent a CT angiogram of the chest on 10/21/2012 showing pulmonary emboli. I had spoken with Dr. Link Snuffer who is going to manage the patient's anticoagulation therapy.   Currently Revlimid is on hold. Patient had been taking 10 mg daily, 3 weeks on 1 week off since June of 2010, initially in combination with Velcade and Decadron. Velcade and Decadron were discontinued in mid May of 2011. The patient has been having a problem with diarrhea now for about the past year but her symptoms seem to be getting worse. We have been concerned that the diarrhea has been due to Revlimid. Initially it seemed to be somewhat manageable.  The patient tells [place] that couple of weeks ago she was having 5-6 stools a day. She has been off of the Revlimid for the past 2 weeks. The patient is now having 2 stools a day with the first we'll being soft and the second stool being watery. As of late last week, around 10/28/2012, the patient has started feeling better and says she is eating better.   As stated in my documentation note from 10/25/2012, I am concerned that patient may be starting to break through her current treatment with Revlimid and Decadron. I am also concerned about the diarrhea which very well may be secondary to the Revlimid.  Options for treatment would include going back to Velcade and Decadron with or without an alkylating agent, carfilzomib and Pomalidomide.

## 2012-11-11 ENCOUNTER — Telehealth: Payer: Self-pay | Admitting: Oncology

## 2012-11-11 ENCOUNTER — Telehealth: Payer: Self-pay | Admitting: Pharmacist

## 2012-11-11 ENCOUNTER — Telehealth: Payer: Self-pay | Admitting: Medical Oncology

## 2012-11-11 NOTE — Telephone Encounter (Signed)
I received a call from Dr. Link Snuffer @ [facility]  He wanted to know if we have Lovenox syringes as "samples" to provide for pt as she converts from Xarelto to Coumadin. She is not going to remain on Xarelto primarily for financial reasons but also Dr. Arline Asp is concerned for her renal impairment w/ myeloma & risk of bleeding on Xarelto. I dosed Lovenox at 1.5 mg/kg/day = 100 mg SQ daily. Pt is starting on Coumadin 5 mg per Dr. Link Snuffer. At Dr. Alphonsus Sias request, I provided 5 days of Lovenox for Ms. Polasek to p/u at our front desk today. Ebony Hail, Pharm.D., CPP 11/11/2012@1 :10 PM

## 2012-11-11 NOTE — Telephone Encounter (Signed)
Moved 7/7 lb/CP to 7/17 @ 2:30pm due to CP delay. S/w grand dtr Lars Masson. lmonvm for desk nurse re if pt will have zometa 7/17. Rosey Bath aware that if zometa is needed I will add appt for inf after f/u.

## 2012-11-15 ENCOUNTER — Other Ambulatory Visit: Payer: Medicare Other | Admitting: Lab

## 2012-11-15 ENCOUNTER — Ambulatory Visit: Payer: Medicare Other

## 2012-11-15 NOTE — Telephone Encounter (Signed)
Opened in error

## 2012-11-17 ENCOUNTER — Telehealth: Payer: Self-pay | Admitting: Oncology

## 2012-11-17 NOTE — Telephone Encounter (Signed)
Per response from desk nurse no zometa for now just lb/fu as scheduled. See 7/3 phone note.

## 2012-11-25 ENCOUNTER — Other Ambulatory Visit (HOSPITAL_BASED_OUTPATIENT_CLINIC_OR_DEPARTMENT_OTHER): Payer: Medicare Other | Admitting: Lab

## 2012-11-25 ENCOUNTER — Telehealth: Payer: Self-pay | Admitting: Hematology and Oncology

## 2012-11-25 ENCOUNTER — Encounter: Payer: Self-pay | Admitting: Medical Oncology

## 2012-11-25 ENCOUNTER — Other Ambulatory Visit: Payer: Self-pay | Admitting: Medical Oncology

## 2012-11-25 ENCOUNTER — Ambulatory Visit (HOSPITAL_BASED_OUTPATIENT_CLINIC_OR_DEPARTMENT_OTHER): Payer: Medicare Other | Admitting: Hematology and Oncology

## 2012-11-25 VITALS — BP 133/99 | HR 99 | Temp 96.7°F | Resp 19 | Ht 64.0 in | Wt 152.5 lb

## 2012-11-25 DIAGNOSIS — C9000 Multiple myeloma not having achieved remission: Secondary | ICD-10-CM

## 2012-11-25 DIAGNOSIS — I2699 Other pulmonary embolism without acute cor pulmonale: Secondary | ICD-10-CM

## 2012-11-25 LAB — COMPREHENSIVE METABOLIC PANEL (CC13)
ALT: 43 U/L (ref 0–55)
AST: 37 U/L — ABNORMAL HIGH (ref 5–34)
Albumin: 3.2 g/dL — ABNORMAL LOW (ref 3.5–5.0)
Alkaline Phosphatase: 76 U/L (ref 40–150)
Glucose: 113 mg/dl (ref 70–140)
Potassium: 4.6 mEq/L (ref 3.5–5.1)
Sodium: 142 mEq/L (ref 136–145)
Total Bilirubin: 0.38 mg/dL (ref 0.20–1.20)
Total Protein: 5.7 g/dL — ABNORMAL LOW (ref 6.4–8.3)

## 2012-11-25 LAB — CBC WITH DIFFERENTIAL/PLATELET
BASO%: 0.5 % (ref 0.0–2.0)
Basophils Absolute: 0 10*3/uL (ref 0.0–0.1)
EOS%: 0.7 % (ref 0.0–7.0)
HGB: 15 g/dL (ref 11.6–15.9)
MCH: 33.1 pg (ref 25.1–34.0)
MCHC: 32.8 g/dL (ref 31.5–36.0)
MCV: 100.8 fL (ref 79.5–101.0)
MONO%: 2.8 % (ref 0.0–14.0)
RBC: 4.53 10*6/uL (ref 3.70–5.45)
RDW: 17.4 % — ABNORMAL HIGH (ref 11.2–14.5)
lymph#: 0.8 10*3/uL — ABNORMAL LOW (ref 0.9–3.3)

## 2012-11-25 LAB — LACTATE DEHYDROGENASE (CC13): LDH: 250 U/L — ABNORMAL HIGH (ref 125–245)

## 2012-11-25 NOTE — Telephone Encounter (Signed)
I called Dr. Link Snuffer per Dr. Sharion Dove request to notify him that we are going to restart pt on velcade SQ. Pt is currently taking coumadin. I asked him if he would be ok for our coumadin clinic to follow. He states this would be great. Dr. Karel Jarvis notified and POF made for the referral to the coumadin.

## 2012-11-25 NOTE — Telephone Encounter (Signed)
Per staff message and POF I have scheduled appts.  JMW  

## 2012-11-25 NOTE — Telephone Encounter (Signed)
gv and printed appt sched and avs for pt....MW added tx   °

## 2012-11-25 NOTE — Progress Notes (Signed)
CC:   Lindsey Dalton, M.D. Lindsey Dalton. Lindsey Dalton, M.D. Lindsey Penna, MD, Fax [phone]  PROBLEM LIST:  1. Kappa light chain multiple myeloma diagnosed in April 2010 with 51%  plasma cells in the marrow. Kappa light chains evident on serum  and urine immunofixation electrophoresis and 8.3 gm of protein in  the urine. FISH studies showed 13q minus and 11:14 translocation  indicative of a poor prognosis. The patient initially was treated  with Velcade and Decadron from mid-May 2010 through mid-May 2011  with an excellent response. She also received Zometa from  09/15/08 to 11/01/10. The patient also has been receiving Revlimid since  June 2010 and has continued on Revlimid and Decadron  since that time in a program as described above. She has had an  excellent response to treatment. Last metastatic bone survey was  on 12/24/2011 and showed no significant changes when compared with  the prior study of 07/20/2011. There were a few lytic lesions, but  no evidence for progression. The patient's last 24-hour urine  collection was on 12/24/2011 and revealed 58 mg of protein.  The UIFE was negative for monoclonal light chains. We  have not repeated the bone marrow since the initial bone marrow of April  2010 which showed 51% plasma cells.  2. History of congestive heart failure with coronary artery disease  status post cardiac catheterizations in 2009.  3. History of diverticulosis and lower GI bleed in May 2010.  4. History of multiple compression fractures with balloon kyphoplasty  involving T11, T12, L1, on 08/17/2008.  5. Hypertension.    6. Dyslipidemia.  7. Hiatal hernia.  8. Severe degenerative joint disease.  9. Vitamin D deficiency.  10. 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  11. Chronic diarrhea occurring 5-6 times a day, 3-4 days a week, associated  with some incontinence. The patient states that diarrhea has been  ongoing since the time of her  myeloma diagnosis which actually dates  back 3 years. Apparently, the symptom is getting worse.  12. Right-sided Port-A-Cath placed on 04/09/2009.   MEDICATIONS:  Reviewed and recorded. Current Outpatient Prescriptions  Medication Sig Dispense Refill  . allopurinol (ZYLOPRIM) 100 MG tablet Take 2 tablets (200 mg total) by mouth daily.  180 tablet  3  . aspirin 81 MG tablet Take 81 mg by mouth daily.        Marland Kitchen atenolol (TENORMIN) 50 MG tablet Take 50 mg by mouth daily.        . Attapulgite (KAOPECTATE PO) Take by mouth as directed.      . calcium citrate-vitamin D (CITRACAL+D) 315-200 MG-UNIT per tablet Take 1 tablet by mouth 2 (two) times daily.       . cholecalciferol (VITAMIN D) 1000 UNITS tablet Take 1,000 Units by mouth daily.        . citalopram (CELEXA) 10 MG tablet TAKE 1 TABLET DAILY.  30 tablet  3  . dexamethasone (DECADRON) 4 MG tablet Take 2 tabs po weekly  24 tablet  3  . diphenoxylate-atropine (LOMOTIL) 2.5-0.025 MG per tablet Take 1 tablet by mouth 2 (two) times daily.  60 tablet  1  . ferrous gluconate (FERGON) 246 (28 FE) MG tablet Take 65 mg by mouth 2 (two) times daily. May be ferrous sulfate.... Will check and bring next time       . guaiFENesin (MUCINEX) 600 MG 12 hr tablet Take 1,200 mg by mouth as needed.        Marland Kitchen  HYDROcodone-acetaminophen (NORCO) 10-325 MG per tablet Take 1 tablet by mouth every 8 (eight) hours as needed for pain.  60 tablet  2  . lenalidomide (REVLIMID) 10 MG capsule TAKE ONE CAPSULE DAILY  21 capsule  0  . Loperamide HCl (IMODIUM PO) Take by mouth as directed.      Marland Kitchen omeprazole (PRILOSEC) 20 MG capsule Take 20 mg by mouth daily as needed.       . ondansetron (ZOFRAN) 8 MG tablet TAKE 1 TABLET BY MOUTH EVERY 12 HOURS AS NEEDED FOR NAUSEA  20 tablet  0  . potassium chloride (K-DUR) 10 MEQ tablet Take 10 mEq by mouth 2 (two) times daily. Take 2 tabs twice a day      . promethazine (PHENERGAN) 25 MG suppository Place 1 suppository (25 mg total) rectally  every 6 (six) hours as needed.  12 each  1  . promethazine (PHENERGAN) 25 MG tablet Take 1 tablet (25 mg total) by mouth every 6 (six) hours as needed.  30 tablet  3  . simvastatin (ZOCOR) 40 MG tablet Take 40 mg by mouth at bedtime.        . temazepam (RESTORIL) 30 MG capsule Take 1 capsule (30 mg total) by mouth at bedtime as needed for sleep.  30 capsule  2  . warfarin (COUMADIN) 2.5 MG tablet Take 2.5 mg by mouth daily.      Marland Kitchen acyclovir (ZOVIRAX) 400 MG tablet Take 400 mg by mouth 2 (two) times daily.       No current facility-administered medications for this visit.     TREATMENT PROGRAM: -Revlimid 10 mg daily 3 weeks out of every 4 weeks. Revlimid was started in June 2010.   -Decadron 8 mg weekly. The patient takes this every Thursday. The patient has been on oral Decadron since June 2010.   -Zometa 3 mg about every 3 months from 09/15/2008 through 11/01/2010. 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   IMMUNIZATIONS:  Pneumovax was given in May 2010.  Flu shot was given on 03/25/2011.    SMOKING HISTORY: Patient has never smoked cigarettes.   HISTORY: We are seeing Lindsey Dalton today for followup of her kappa light chain multiple myeloma.  She is accompanied by her granddaughter, Lindsey Dalton.  She was diagnosed with acute PE recently and is currently  on coumadin. She is off Revlimid now due to the PE  She underwent workup by her cardiologist a couple of months ago, apparently an exercise test. The patient denies any fever, but says she has chills.  She is not having any swelling or pain in her legs.  She has mild chronic low back pain.  Her multiple myeloma markers have appeared to be slowly rising.    Blood pressure 133/99, pulse 99, temperature 96.7 F (35.9 C), temperature source Oral, resp. rate 19, height 5\' 4"  (1.626 m), weight 152 lb 8 oz (69.174 kg).  PHYSICAL EXAMINATION:  General:   The patient is in a wheelchair. Breathing is slightly labored, although she is not on oxygen.  There has been no diarrhea, nausea, or vomiting.  She complains of weakness. HEENT:  There is no scleral icterus.  Mouth and pharynx are benign.  The patient has dentures.  There is no adenopathy palpable.  Lungs:  Some very slight inspiratory rales and slight expiratory wheeze at the bases.  Cardiac: Regular rhythm without murmur or rub.  Abdomen:  Benign with the patient sitting in  a wheelchair.  Extremities:  Trace to 1+ edema of both lateral ankles, but no significant pitting elsewhere.  The patient does have a right-sided Port-A-Cath that will be flushed with heparin today after she receives IV fluids and IV Zofran.  Neurologic:  Exam was nonfocal.  The patient is alert and appropriate.  LABORATORY DATA:    CBC    Component Value Date/Time   WBC 6.8 11/25/2012 1441   WBC 13.3* 09/28/2008 0420   RBC 4.53 11/25/2012 1441   RBC 3.08* 09/28/2008 0420   HGB 15.0 11/25/2012 1441   HGB 9.9* 09/28/2008 0420   HCT 45.7 11/25/2012 1441   HCT 29.3* 09/28/2008 0420   PLT 200 11/25/2012 1441   PLT 178 09/28/2008 0420   MCV 100.8 11/25/2012 1441   MCV 94.9 09/28/2008 0420   MCH 33.1 11/25/2012 1441   MCH 33.3 06/12/2010 1512   MCHC 32.8 11/25/2012 1441   MCHC 33.7 09/28/2008 0420   RDW 17.4* 11/25/2012 1441   RDW 17.0* 09/28/2008 0420   LYMPHSABS 0.8* 11/25/2012 1441   LYMPHSABS 0.6* 09/21/2008 1040   MONOABS 0.2 11/25/2012 1441   MONOABS 1.3* 09/21/2008 1040   EOSABS 0.0 11/25/2012 1441   EOSABS 0.0 09/21/2008 1040   BASOSABS 0.0 11/25/2012 1441   BASOSABS 0.1 09/21/2008 1040    Lab Results  Component Value Date   GLUCOSE 113 11/25/2012   BUN 14.5 11/25/2012   CO2 27 11/25/2012   ALT 43 11/25/2012   AST 37* 11/25/2012   LDH 250* 11/25/2012   K 4.6 11/25/2012   CREATININE 0.8 11/25/2012   Also pending from today are quantitative immunoglobulins and serum light chains.  IMAGING STUDIES:  1. Metastatic bone  survey carried out on 07/20/2009  showed osteopenia. There were some small lucent lesions within the  skull consistent with a either venous lakes or multiple myeloma  deposits. There were numerous wedge compression fractures and  degenerative changes throughout the spine. There was a wedge  compression of L3 of indeterminate age.  2. Chest x-ray, 2-view, from  07/25/2010 showed no active cardiopulmonary disease.  3. Metastatic bone  survey from 09/05/2010 showed unchanged osseous survey with a few lytic  lesions that were seen in the skull, possibly the right femur at the  junction of the middle and distal thirds. There were compression  fractures of the thoracic and lumbar spine unchanged. The patient is  status post vertebroplasty at T11, T12, and L1. A small lytic lesion  was seen in the scapula. There was a degenerative cyst in the greater  tuberosity of the left humerus.  4. Metastatic bone survey on 07/28/2011 showed minimal progression of small  lytic lesions in the skull compatible with a history of multiple  myeloma. There was no significant change in the small lytic lesions in  left scapula. There was probably better visualization of small lytic  lesions in the proximal left femur and poor visualization of a small  lytic lesion in the distal right femur. There was stable diffuse  osteopenia and multiple vertebral compression fractures.  5. Metastatic bone survey from 12/24/2011 showed multiple compression  fractures in the spine which are stable. There were tiny lytic  lesions seen in the humeri and in the left clavicle as well as the  left scapula. These were felt to be stable. No impending  pathologic fractures.  6. Chest x-ray, 2 view, from 12/24/2011 showed no acute disease. 7. Bone density scan from 10/13/2012 showed a T-score for the lumbar  spine total of -0.5.  T-score of the right femoral neck was -2.8 and the T- score for the left femoral neck was  -3.2.    IMPRESSION AND PLAN:    Ms FERRIS TALLY is 37 y old female with Kappa light chain multiple myeloma diagnosed in April 2010 with 51%  plasma cells in the marrow. As stated above, the patient most recently has been taking Revlimid 10 mg daily, 3 weeks on 1, week off.  However, she developed PE and therefore, she was taken off Revlimid and  Was started on coumadin.The patient has a history of congestive heart failure.  We explained to patient and er family that the PE could be a side effect of the Revlimid and therefore, we would not recommend using it at least for the time being. Since she demonstrated a good response to valcade, we would recommend starting valcade 1.3 mg/m2 weekly and consider switching the coumadin to [facility]. Will start her treatment Monday 11/29/2012.  Zachery Dakins, MD 11/25/2012 5:20 PM

## 2012-11-29 ENCOUNTER — Other Ambulatory Visit (HOSPITAL_BASED_OUTPATIENT_CLINIC_OR_DEPARTMENT_OTHER): Payer: Medicare Other | Admitting: Lab

## 2012-11-29 ENCOUNTER — Ambulatory Visit (HOSPITAL_BASED_OUTPATIENT_CLINIC_OR_DEPARTMENT_OTHER): Payer: Medicare Other

## 2012-11-29 ENCOUNTER — Ambulatory Visit: Payer: Medicare Other | Admitting: Pharmacist

## 2012-11-29 VITALS — BP 149/89 | HR 74 | Temp 97.7°F | Resp 18

## 2012-11-29 DIAGNOSIS — C9000 Multiple myeloma not having achieved remission: Secondary | ICD-10-CM

## 2012-11-29 DIAGNOSIS — I2699 Other pulmonary embolism without acute cor pulmonale: Secondary | ICD-10-CM

## 2012-11-29 DIAGNOSIS — Z5112 Encounter for antineoplastic immunotherapy: Secondary | ICD-10-CM

## 2012-11-29 LAB — CBC WITH DIFFERENTIAL/PLATELET
Basophils Absolute: 0 10*3/uL (ref 0.0–0.1)
EOS%: 2.3 % (ref 0.0–7.0)
Eosinophils Absolute: 0.1 10*3/uL (ref 0.0–0.5)
HCT: 44.9 % (ref 34.8–46.6)
HGB: 15 g/dL (ref 11.6–15.9)
MCH: 32.8 pg (ref 25.1–34.0)
MONO#: 0.9 10*3/uL (ref 0.1–0.9)
NEUT#: 2.7 10*3/uL (ref 1.5–6.5)
NEUT%: 50.6 % (ref 38.4–76.8)
RDW: 15.3 % — ABNORMAL HIGH (ref 11.2–14.5)
WBC: 5.3 10*3/uL (ref 3.9–10.3)
lymph#: 1.6 10*3/uL (ref 0.9–3.3)

## 2012-11-29 LAB — PROTIME-INR
INR: 1.4 — ABNORMAL LOW (ref 2.00–3.50)
Protime: 16.8 Seconds — ABNORMAL HIGH (ref 10.6–13.4)

## 2012-11-29 MED ORDER — BORTEZOMIB CHEMO SQ INJECTION 3.5 MG (2.5MG/ML)
1.3000 mg/m2 | Freq: Once | INTRAMUSCULAR | Status: AC
  Administered 2012-11-29: 2.25 mg via SUBCUTANEOUS
  Filled 2012-11-29: qty 2.25

## 2012-11-29 MED ORDER — ONDANSETRON HCL 8 MG PO TABS
8.0000 mg | ORAL_TABLET | Freq: Once | ORAL | Status: AC
  Administered 2012-11-29: 8 mg via ORAL

## 2012-11-29 NOTE — Progress Notes (Signed)
Pt newly dx w/ PE in June 2014 caused by Revlimid. She was on Xarelto for a short time then bridged w/ Lovenox 100 mg SQ daily then to Coumadin. She started Coumadin 4 mg daily on 11/11/12 (initiated by Dr. [name] w/ [facility]). Her INR was checked only once since starting Coumadin, on 11/26/12- INR = 6 at [facility].  (I spoke w/ Dr. Link Snuffer on the phone today). After discussion w/ Dr. Karel Jarvis today & with pt, it has been decided to stop Coumadin now & put pt on long-term Lovenox injections.  There is variability now in her INR's and she is starting on Velcade today.   I provided pt w/ 1 box (10 syringes) of Lovenox 100 mg.  She understands she will inject 1 syringe daily. Our finance office will begin process for insurance approval or pt assistant/grant today. Pt will call our pharmacy if she is close to running out of Lovenox injections; I gave her our phone #.   If at some point Dr. Karel Jarvis wishes for pt to bridge back to Coumadin, he will need to consult our clinic again. Ebony Hail, Pharm.D., CPP 11/29/2012@5 :02 PM

## 2012-11-29 NOTE — Patient Instructions (Addendum)
[facility] Discharge Instructions for Patients Receiving Chemotherapy  Today you received the following chemotherapy agents: Velcade. To help prevent nausea and vomiting after your treatment, we encourage you to take your nausea medication.  If you develop nausea and vomiting that is not controlled by your nausea medication, call the clinic.   BELOW ARE SYMPTOMS THAT SHOULD BE REPORTED IMMEDIATELY:  *FEVER GREATER THAN 100.5 F  *CHILLS WITH OR WITHOUT FEVER  NAUSEA AND VOMITING THAT IS NOT CONTROLLED WITH YOUR NAUSEA MEDICATION  *UNUSUAL SHORTNESS OF BREATH  *UNUSUAL BRUISING OR BLEEDING  TENDERNESS IN MOUTH AND THROAT WITH OR WITHOUT PRESENCE OF ULCERS  *URINARY PROBLEMS  *BOWEL PROBLEMS  UNUSUAL RASH Items with * indicate a potential emergency and should be followed up as soon as possible.  Feel free to call the clinic you have any questions or concerns. The clinic phone number is [phone].    

## 2012-11-30 ENCOUNTER — Other Ambulatory Visit: Payer: Self-pay | Admitting: Pharmacist

## 2012-11-30 DIAGNOSIS — I2699 Other pulmonary embolism without acute cor pulmonale: Secondary | ICD-10-CM

## 2012-11-30 MED ORDER — ENOXAPARIN SODIUM 100 MG/ML ~~LOC~~ SOLN: 100.0000 mg | Freq: Every day | SUBCUTANEOUS | Status: DC

## 2012-12-01 ENCOUNTER — Encounter: Payer: Self-pay | Admitting: Pharmacist

## 2012-12-01 ENCOUNTER — Encounter: Payer: Self-pay | Admitting: Hematology and Oncology

## 2012-12-01 NOTE — Progress Notes (Signed)
Lindsey Dalton, Teacher, adult education at [facility] is aware of pts need for Lovenox 100 mg syringes. I RX'd Lovenox 100 mg to [facility] & they priced 10 syringes at $26.35. I left a voicemail & emailed Salvatore Marvel to inform her of the price.  Lindsey will be calling pt to tell her.   Ebony Hail, Pharm.D., CPP 12/01/2012@3 :23 PM

## 2012-12-01 NOTE — Progress Notes (Signed)
I called to let the patient know no app can be done since her insurance will help with meds-- Lovenox. Ra[facility], [facility] outpatient pharmacy priced 10 syringes of Lovenox 100 mg for [place] at $26.35.  This is a 10 day supply. I sent them a script.    She said her grandaughter had been calling me and left message. I called her bk at [phone] and [phone] to advise all was done and she could call me back if needed.

## 2012-12-06 ENCOUNTER — Other Ambulatory Visit (HOSPITAL_BASED_OUTPATIENT_CLINIC_OR_DEPARTMENT_OTHER): Payer: Medicare Other | Admitting: Lab

## 2012-12-06 ENCOUNTER — Other Ambulatory Visit: Payer: Self-pay | Admitting: Hematology and Oncology

## 2012-12-06 ENCOUNTER — Ambulatory Visit (HOSPITAL_BASED_OUTPATIENT_CLINIC_OR_DEPARTMENT_OTHER): Payer: Medicare Other

## 2012-12-06 VITALS — BP 108/72 | HR 97 | Temp 97.0°F | Resp 18

## 2012-12-06 DIAGNOSIS — C9000 Multiple myeloma not having achieved remission: Secondary | ICD-10-CM

## 2012-12-06 DIAGNOSIS — Z5112 Encounter for antineoplastic immunotherapy: Secondary | ICD-10-CM

## 2012-12-06 LAB — CBC WITH DIFFERENTIAL/PLATELET
BASO%: 0.2 % (ref 0.0–2.0)
LYMPH%: 25.5 % (ref 14.0–49.7)
MCHC: 32.5 g/dL (ref 31.5–36.0)
MCV: 100.2 fL (ref 79.5–101.0)
MONO#: 1.3 10*3/uL — ABNORMAL HIGH (ref 0.1–0.9)
MONO%: 22.7 % — ABNORMAL HIGH (ref 0.0–14.0)
Platelets: 150 10*3/uL (ref 145–400)
RBC: 4.48 10*6/uL (ref 3.70–5.45)
RDW: 15.7 % — ABNORMAL HIGH (ref 11.2–14.5)
WBC: 5.8 10*3/uL (ref 3.9–10.3)
nRBC: 0 % (ref 0–0)

## 2012-12-06 MED ORDER — BORTEZOMIB CHEMO SQ INJECTION 3.5 MG (2.5MG/ML)
1.3000 mg/m2 | Freq: Once | INTRAMUSCULAR | Status: AC
  Administered 2012-12-06: 2.25 mg via SUBCUTANEOUS
  Filled 2012-12-06: qty 2.25

## 2012-12-06 MED ORDER — ONDANSETRON HCL 8 MG PO TABS
8.0000 mg | ORAL_TABLET | Freq: Once | ORAL | Status: AC
  Administered 2012-12-06: 8 mg via ORAL

## 2012-12-13 ENCOUNTER — Telehealth: Payer: Self-pay | Admitting: Hematology and Oncology

## 2012-12-13 ENCOUNTER — Ambulatory Visit (HOSPITAL_BASED_OUTPATIENT_CLINIC_OR_DEPARTMENT_OTHER): Payer: Medicare Other

## 2012-12-13 ENCOUNTER — Other Ambulatory Visit (HOSPITAL_BASED_OUTPATIENT_CLINIC_OR_DEPARTMENT_OTHER): Payer: Medicare Other | Admitting: Lab

## 2012-12-13 ENCOUNTER — Ambulatory Visit (HOSPITAL_BASED_OUTPATIENT_CLINIC_OR_DEPARTMENT_OTHER): Payer: Medicare Other | Admitting: Hematology and Oncology

## 2012-12-13 VITALS — BP 123/85 | HR 93 | Temp 97.6°F | Resp 20 | Ht 64.0 in | Wt 156.1 lb

## 2012-12-13 DIAGNOSIS — I509 Heart failure, unspecified: Secondary | ICD-10-CM

## 2012-12-13 DIAGNOSIS — R32 Unspecified urinary incontinence: Secondary | ICD-10-CM

## 2012-12-13 DIAGNOSIS — R197 Diarrhea, unspecified: Secondary | ICD-10-CM

## 2012-12-13 DIAGNOSIS — C9000 Multiple myeloma not having achieved remission: Secondary | ICD-10-CM

## 2012-12-13 DIAGNOSIS — Z5112 Encounter for antineoplastic immunotherapy: Secondary | ICD-10-CM

## 2012-12-13 LAB — COMPREHENSIVE METABOLIC PANEL (CC13)
AST: 23 U/L (ref 5–34)
Albumin: 3 g/dL — ABNORMAL LOW (ref 3.5–5.0)
Alkaline Phosphatase: 61 U/L (ref 40–150)
Potassium: 4.2 mEq/L (ref 3.5–5.1)
Sodium: 142 mEq/L (ref 136–145)
Total Bilirubin: 0.38 mg/dL (ref 0.20–1.20)
Total Protein: 5.3 g/dL — ABNORMAL LOW (ref 6.4–8.3)

## 2012-12-13 LAB — CBC WITH DIFFERENTIAL/PLATELET
BASO%: 0.2 % (ref 0.0–2.0)
EOS%: 2.1 % (ref 0.0–7.0)
MCH: 32.9 pg (ref 25.1–34.0)
MCHC: 32.5 g/dL (ref 31.5–36.0)
MCV: 101.2 fL — ABNORMAL HIGH (ref 79.5–101.0)
MONO%: 18.4 % — ABNORMAL HIGH (ref 0.0–14.0)
NEUT#: 4.1 10*3/uL (ref 1.5–6.5)
RBC: 4.21 10*6/uL (ref 3.70–5.45)
RDW: 16.8 % — ABNORMAL HIGH (ref 11.2–14.5)

## 2012-12-13 MED ORDER — BORTEZOMIB CHEMO SQ INJECTION 3.5 MG (2.5MG/ML)
1.3000 mg/m2 | Freq: Once | INTRAMUSCULAR | Status: AC
  Administered 2012-12-13: 2.25 mg via SUBCUTANEOUS
  Filled 2012-12-13: qty 2.25

## 2012-12-13 MED ORDER — ONDANSETRON HCL 8 MG PO TABS
8.0000 mg | ORAL_TABLET | Freq: Once | ORAL | Status: AC
  Administered 2012-12-13: 8 mg via ORAL

## 2012-12-13 NOTE — Patient Instructions (Addendum)
[facility] Discharge Instructions for Patients Receiving Chemotherapy  Today you received the following chemotherapy agents: Velcade.  To help prevent nausea and vomiting after your treatment, we encourage you to take your nausea medication as prescribed.   If you develop nausea and vomiting that is not controlled by your nausea medication, call the clinic.   BELOW ARE SYMPTOMS THAT SHOULD BE REPORTED IMMEDIATELY:  *FEVER GREATER THAN 100.5 F  *CHILLS WITH OR WITHOUT FEVER  NAUSEA AND VOMITING THAT IS NOT CONTROLLED WITH YOUR NAUSEA MEDICATION  *UNUSUAL SHORTNESS OF BREATH  *UNUSUAL BRUISING OR BLEEDING  TENDERNESS IN MOUTH AND THROAT WITH OR WITHOUT PRESENCE OF ULCERS  *URINARY PROBLEMS  *BOWEL PROBLEMS  UNUSUAL RASH Items with * indicate a potential emergency and should be followed up as soon as possible.  Feel free to call the clinic you have any questions or concerns. The clinic phone number is [phone].    

## 2012-12-13 NOTE — Telephone Encounter (Signed)
gv and printed appt sched and avs for pt...emailed MB to add tx.   °

## 2012-12-13 NOTE — Progress Notes (Signed)
CC:   [name], M.D. [name]. [name], M.D. [name], MD, Fax [phone]  PROBLEM LIST:  1. Kappa light chain multiple myeloma diagnosed in April 2010 with 51%  plasma cells in the marrow. Kappa light chains evident on serum  and urine immunofixation electrophoresis and 8.3 gm of protein in  the urine. FISH studies showed 13q minus and 11:14 translocation  indicative of a poor prognosis. The patient initially was treated  with Velcade and Decadron from mid-May 2010 through mid-May 2011  with an excellent response. She also received Zometa from  09/15/08 to 11/01/10. The patient also has been receiving Revlimid since  June 2010 and has continued on Revlimid and Decadron  since that time in a program as described above. She has had an  excellent response to treatment. Last metastatic bone survey was  on 12/24/2011 and showed no significant changes when compared with  the prior study of 07/20/2011. There were a few lytic lesions, but  no evidence for progression. The patient's last 24-hour urine  collection was on 12/24/2011 and revealed 58 mg of protein.  The UIFE was negative for monoclonal light chains. We  have not repeated the bone marrow since the initial bone marrow of April  2010 which showed 51% plasma cells.  2. History of congestive heart failure with coronary artery disease  status post cardiac catheterizations in 2009.  3. History of diverticulosis and lower GI bleed in May 2010.  4. History of multiple compression fractures with balloon kyphoplasty  involving T11, T12, L1, on 08/17/2008.  5. Hypertension.    6. Dyslipidemia.  7. Hiatal hernia.  8. Severe degenerative joint disease.  9. Vitamin D deficiency.  10. 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  11. Chronic diarrhea occurring 5-6 times a day, 3-4 days a week, associated  with some incontinence. The patient states that diarrhea has been  ongoing since the time of her  myeloma diagnosis which actually dates  back 3 years. Apparently, the symptom is getting worse.  12. Right-sided Port-A-Cath placed on 04/09/2009.   MEDICATIONS:  Reviewed and recorded. Current Outpatient Prescriptions  Medication Sig Dispense Refill  . acyclovir (ZOVIRAX) 400 MG tablet Take 400 mg by mouth 2 (two) times daily.      Marland Kitchen allopurinol (ZYLOPRIM) 100 MG tablet Take 2 tablets (200 mg total) by mouth daily.  180 tablet  3  . aspirin 81 MG tablet Take 81 mg by mouth daily.        Marland Kitchen atenolol (TENORMIN) 50 MG tablet Take 50 mg by mouth daily.        . Attapulgite (KAOPECTATE PO) Take by mouth as directed.      . calcium citrate-vitamin D (CITRACAL+D) 315-200 MG-UNIT per tablet Take 1 tablet by mouth 2 (two) times daily.       . cholecalciferol (VITAMIN D) 1000 UNITS tablet Take 1,000 Units by mouth daily.        . citalopram (CELEXA) 10 MG tablet TAKE 1 TABLET DAILY.  30 tablet  3  . dexamethasone (DECADRON) 4 MG tablet Take 2 tabs po weekly  24 tablet  3  . diphenoxylate-atropine (LOMOTIL) 2.5-0.025 MG per tablet Take 1 tablet by mouth 2 (two) times daily.  60 tablet  1  . enoxaparin (LOVENOX) 100 MG/ML injection Inject 1 mL (100 mg total) into the skin daily.  10 Syringe  6  . ferrous gluconate (FERGON) 246 (28 FE) MG tablet Take 65 mg by  mouth 2 (two) times daily. May be ferrous sulfate.... Will check and bring next time       . guaiFENesin (MUCINEX) 600 MG 12 hr tablet Take 1,200 mg by mouth as needed.        Marland Kitchen HYDROcodone-acetaminophen (NORCO) 10-325 MG per tablet Take 1 tablet by mouth every 8 (eight) hours as needed for pain.  60 tablet  2  . Loperamide HCl (IMODIUM PO) Take by mouth as directed.      Marland Kitchen omeprazole (PRILOSEC) 20 MG capsule Take 20 mg by mouth daily as needed.       . ondansetron (ZOFRAN) 8 MG tablet TAKE 1 TABLET BY MOUTH EVERY 12 HOURS AS NEEDED FOR NAUSEA  20 tablet  0  . potassium chloride (K-DUR) 10 MEQ tablet Take 10 mEq by mouth 2 (two) times daily. Take 2  tabs twice a day      . promethazine (PHENERGAN) 25 MG suppository Place 1 suppository (25 mg total) rectally every 6 (six) hours as needed.  12 each  1  . promethazine (PHENERGAN) 25 MG tablet Take 1 tablet (25 mg total) by mouth every 6 (six) hours as needed.  30 tablet  3  . simvastatin (ZOCOR) 40 MG tablet Take 40 mg by mouth at bedtime.        . temazepam (RESTORIL) 30 MG capsule Take 1 capsule (30 mg total) by mouth at bedtime as needed for sleep.  30 capsule  2   No current facility-administered medications for this visit.     TREATMENT PROGRAM: -Revlimid 10 mg daily 3 weeks out of every 4 weeks. Revlimid was started in June 2010.   -Decadron 8 mg weekly. The patient takes this every Thursday. The patient has been on oral Decadron since June 2010.   -Zometa 3 mg about every 3 months from 09/15/2008 through 11/01/2010. 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   IMMUNIZATIONS:  Pneumovax was given in May 2010.  Flu shot was given on 03/25/2011.    SMOKING HISTORY: Patient has never smoked cigarettes.   HISTORY: We are seeing Lindsey Dalton today for followup of her kappa light chain multiple myeloma.  She is accompanied by her granddaughter, Lindsey Dalton.  She was diagnosed with acute PE recently and is currently  on coumadin. She is off Revlimid now due to the PE  She underwent workup by her cardiologist a couple of months ago, apparently an exercise test. The patient denies any fever, but says she has chills.  She is not having any swelling or pain in her legs.  She has mild chronic low back pain.  Her multiple myeloma markers have appeared to be slowly rising.   She is complaining of being tired and fatigue after getting the valcade treatment, which is little bit unusual.  Blood pressure 123/85, pulse 93, temperature 97.6 F (36.4 C), temperature source Oral, resp. rate 20, height 5\' 4"  (1.626  m), weight 156 lb 1.6 oz (70.806 kg). PHYSICAL EXAMINATION:  General:  The patient is in a wheelchair. Breathing is slightly labored, although she is not on oxygen.  There has been no diarrhea, nausea, or vomiting.  She complains of weakness. HEENT:  There is no scleral icterus.  Mouth and pharynx are benign.  The patient has dentures.  There is no adenopathy palpable.  Lungs:  Some very slight inspiratory rales and slight expiratory wheeze at the bases.  Cardiac: Regular rhythm without murmur or rub.  Abdomen:  Benign with the patient sitting in a wheelchair.  Extremities:  Trace to 1+ edema of both lateral ankles, but no significant pitting elsewhere.  Neurologic:  Exam was nonfocal.  The patient is alert and appropriate.  LABORATORY DATA:    CBC    Component Value Date/Time   WBC 6.5 12/13/2012 1423   WBC 13.3* 09/28/2008 0420   RBC 4.21 12/13/2012 1423   RBC 3.08* 09/28/2008 0420   HGB 13.9 12/13/2012 1423   HGB 9.9* 09/28/2008 0420   HCT 42.6 12/13/2012 1423   HCT 29.3* 09/28/2008 0420   PLT 174 12/13/2012 1423   PLT 178 09/28/2008 0420   MCV 101.2* 12/13/2012 1423   MCV 94.9 09/28/2008 0420   MCH 32.9 12/13/2012 1423   MCH 33.3 06/12/2010 1512   MCHC 32.5 12/13/2012 1423   MCHC 33.7 09/28/2008 0420   RDW 16.8* 12/13/2012 1423   RDW 17.0* 09/28/2008 0420   LYMPHSABS 1.0 12/13/2012 1423   LYMPHSABS 0.6* 09/21/2008 1040   MONOABS 1.2* 12/13/2012 1423   MONOABS 1.3* 09/21/2008 1040   EOSABS 0.1 12/13/2012 1423   EOSABS 0.0 09/21/2008 1040   BASOSABS 0.0 12/13/2012 1423   BASOSABS 0.1 09/21/2008 1040    Lab Results  Component Value Date   GLUCOSE 94 12/13/2012   BUN 10.2 12/13/2012   CO2 26 12/13/2012   ALT 17 12/13/2012   AST 23 12/13/2012   LDH 250* 11/25/2012   K 4.2 12/13/2012   CREATININE 0.8 12/13/2012   Also pending from today are quantitative immunoglobulins and serum light chains.  IMAGING STUDIES:  1. Metastatic bone survey carried out on 07/20/2009  showed osteopenia. There were some small lucent  lesions within the  skull consistent with a either venous lakes or multiple myeloma  deposits. There were numerous wedge compression fractures and  degenerative changes throughout the spine. There was a wedge  compression of L3 of indeterminate age.  2. Chest x-ray, 2-view, from  07/25/2010 showed no active cardiopulmonary disease.  3. Metastatic bone  survey from 09/05/2010 showed unchanged osseous survey with a few lytic  lesions that were seen in the skull, possibly the right femur at the  junction of the middle and distal thirds. There were compression  fractures of the thoracic and lumbar spine unchanged. The patient is  status post vertebroplasty at T11, T12, and L1. A small lytic lesion  was seen in the scapula. There was a degenerative cyst in the greater  tuberosity of the left humerus.  4. Metastatic bone survey on 07/28/2011 showed minimal progression of small  lytic lesions in the skull compatible with a history of multiple  myeloma. There was no significant change in the small lytic lesions in  left scapula. There was probably better visualization of small lytic  lesions in the proximal left femur and poor visualization of a small  lytic lesion in the distal right femur. There was stable diffuse  osteopenia and multiple vertebral compression fractures.  5. Metastatic bone survey from 12/24/2011 showed multiple compression  fractures in the spine which are stable. There were tiny lytic  lesions seen in the humeri and in the left clavicle as well as the  left scapula. These were felt to be stable. No impending  pathologic fractures.  6. Chest x-ray, 2 view, from 12/24/2011 showed no acute disease. 7. Bone density scan from 10/13/2012 showed a T-score for the lumbar spine total of -0.5.  T-score of the right femoral neck was -2.8 and the  T- score for the left femoral neck was -3.2.    IMPRESSION AND PLAN:    Lindsey Dalton is 44 y old female with Kappa light chain  multiple myeloma diagnosed in April 2010 with 51%  plasma cells in the marrow. As stated above, the patient most recently has been taking Revlimid 10 mg daily, 3 weeks on 1, week off.  However, she developed PE and therefore, she was taken off Revlimid and  Was started on coumadin. The patient has a history of congestive heart failure.  On her last visit on 11/25/2012, we explained to patient and her family that the PE could be a side effect of the Revlimid and therefore, we would not recommend using it at least for the time being. Since she demonstrated a good response to valcade, we recommended starting valcade 1.3 mg/m2 weekly and consider switching the coumadin to [facility]. She received her fist valcade treatment on Monday 11/29/2012. She is complaining of being tired and fatigue, which is uncommon with valcade. Will Rx today (which will be her week #3) then off next week 12/20/2012 then on 12/27/2012 she will return to clinic for reassessment and the one of cycle 2 of valcade. Patient is also continue to take Decadron once a week. We will bring patient back to the clinic next week to see Lindsey. Tiana Loft for followup and to make sure her symptoms are resolving.  Zachery Dakins, MD 12/13/2012 3:28 PM

## 2012-12-15 LAB — SPEP & IFE WITH QIG
Albumin ELP: 62.1 % (ref 55.8–66.1)
Alpha-2-Globulin: 12.7 % — ABNORMAL HIGH (ref 7.1–11.8)
Beta Globulin: 7.9 % — ABNORMAL HIGH (ref 4.7–7.2)
Total Protein, Serum Electrophoresis: 5 g/dL — ABNORMAL LOW (ref 6.0–8.3)

## 2012-12-20 ENCOUNTER — Ambulatory Visit: Payer: Medicare Other

## 2012-12-20 ENCOUNTER — Telehealth: Payer: Self-pay | Admitting: Internal Medicine

## 2012-12-20 ENCOUNTER — Telehealth: Payer: Self-pay | Admitting: *Deleted

## 2012-12-20 ENCOUNTER — Other Ambulatory Visit: Payer: Medicare Other | Admitting: Lab

## 2012-12-20 ENCOUNTER — Ambulatory Visit (HOSPITAL_BASED_OUTPATIENT_CLINIC_OR_DEPARTMENT_OTHER): Payer: Medicare Other | Admitting: Physician Assistant

## 2012-12-20 ENCOUNTER — Other Ambulatory Visit: Payer: Self-pay | Admitting: Medical Oncology

## 2012-12-20 VITALS — BP 135/87 | HR 108 | Temp 98.4°F | Resp 20 | Ht 64.0 in | Wt 155.3 lb

## 2012-12-20 DIAGNOSIS — C9000 Multiple myeloma not having achieved remission: Secondary | ICD-10-CM

## 2012-12-20 DIAGNOSIS — M549 Dorsalgia, unspecified: Secondary | ICD-10-CM

## 2012-12-20 DIAGNOSIS — I2699 Other pulmonary embolism without acute cor pulmonale: Secondary | ICD-10-CM

## 2012-12-20 LAB — UIFE/LIGHT CHAINS/TP QN, 24-HR UR
Beta, Urine: DETECTED — AB
Free Kappa Lt Chains,Ur: 35.1 mg/dL — ABNORMAL HIGH (ref 0.14–2.42)
Free Kappa/Lambda Ratio: 1170 ratio — ABNORMAL HIGH (ref 2.04–10.37)
Free Lambda Lt Chains,Ur: 0.03 mg/dL (ref 0.02–0.67)
Free Lt Chn Excr Rate: 561.6 mg/d
Total Protein, Urine-Ur/day: 570 mg/d — ABNORMAL HIGH (ref 10–140)
Total Protein, Urine: 35.6 mg/dL
Volume, Urine: 1600 mL

## 2012-12-20 MED ORDER — ONDANSETRON HCL 8 MG PO TABS: ORAL_TABLET | ORAL | Status: AC

## 2012-12-20 MED ORDER — PROMETHAZINE HCL 25 MG PO TABS: 25.0000 mg | ORAL_TABLET | Freq: Four times a day (QID) | ORAL | Status: AC | PRN

## 2012-12-20 NOTE — Telephone Encounter (Signed)
gv and printed appt sched and avs for pt...emailed MW to add tx... °

## 2012-12-20 NOTE — Patient Instructions (Addendum)
Resume weekly Velcade treatments starting 12/27/12 Follow up in 3 weeks

## 2012-12-20 NOTE — Telephone Encounter (Signed)
Per staff message and POF I have scheduled appts.  JMW  

## 2012-12-24 NOTE — Progress Notes (Signed)
CC:   [name], M.D. [name]. [name], M.D. [name], MD, Fax [phone]  PROBLEM LIST:  1. Kappa light chain multiple myeloma diagnosed in April 2010 with 51%  plasma cells in the marrow. Kappa light chains evident on serum  and urine immunofixation electrophoresis and 8.3 gm of protein in  the urine. FISH studies showed 13q minus and 11:14 translocation  indicative of a poor prognosis. The patient initially was treated  with Velcade and Decadron from mid-May 2010 through mid-May 2011  with an excellent response. She also received Zometa from  09/15/08 to 11/01/10. The patient also has been receiving Revlimid since  June 2010 and has continued on Revlimid and Decadron  since that time in a program as described above. She has had an  excellent response to treatment. Last metastatic bone survey was  on 12/24/2011 and showed no significant changes when compared with  the prior study of 07/20/2011. There were a few lytic lesions, but  no evidence for progression. The patient's last 24-hour urine  collection was on 12/24/2011 and revealed 58 mg of protein.  The UIFE was negative for monoclonal light chains. We  have not repeated the bone marrow since the initial bone marrow of April  2010 which showed 51% plasma cells.  2. History of congestive heart failure with coronary artery disease  status post cardiac catheterizations in 2009.  3. History of diverticulosis and lower GI bleed in May 2010.  4. History of multiple compression fractures with balloon kyphoplasty  involving T11, T12, L1, on 08/17/2008.  5. Hypertension.    6. Dyslipidemia.  7. Hiatal hernia.  8. Severe degenerative joint disease.  9. Vitamin D deficiency.  10. 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  11. Chronic diarrhea occurring 5-6 times a day, 3-4 days a week, associated  with some incontinence. The patient states that diarrhea has been  ongoing since the time of her  myeloma diagnosis which actually dates  back 3 years. Apparently, the symptom is getting worse.  12. Right-sided Port-A-Cath placed on 04/09/2009.   MEDICATIONS:  Reviewed and recorded. Current Outpatient Prescriptions  Medication Sig Dispense Refill  . furosemide (LASIX) 40 MG tablet Take 40 mg by mouth daily.      . Potassium Chloride Crys CR (KLOR-CON M20 PO) Take 1 tablet by mouth 2 (two) times daily.      Marland Kitchen acyclovir (ZOVIRAX) 400 MG tablet Take 400 mg by mouth 2 (two) times daily.      Marland Kitchen allopurinol (ZYLOPRIM) 100 MG tablet Take 2 tablets (200 mg total) by mouth daily.  180 tablet  3  . aspirin 81 MG tablet Take 81 mg by mouth daily.        Marland Kitchen atenolol (TENORMIN) 50 MG tablet Take 50 mg by mouth daily.        . Attapulgite (KAOPECTATE PO) Take by mouth as directed.      . calcium citrate-vitamin D (CITRACAL+D) 315-200 MG-UNIT per tablet Take 1 tablet by mouth 2 (two) times daily.       . cholecalciferol (VITAMIN D) 1000 UNITS tablet Take 1,000 Units by mouth daily.        . citalopram (CELEXA) 10 MG tablet TAKE 1 TABLET DAILY.  30 tablet  3  . dexamethasone (DECADRON) 4 MG tablet Take 2 tabs po weekly  24 tablet  3  . diphenoxylate-atropine (LOMOTIL) 2.5-0.025 MG per tablet Take 1 tablet by mouth 2 (two) times daily.  60  tablet  1  . enoxaparin (LOVENOX) 100 MG/ML injection Inject 1 mL (100 mg total) into the skin daily.  10 Syringe  6  . ferrous gluconate (FERGON) 246 (28 FE) MG tablet Take 65 mg by mouth 2 (two) times daily. May be ferrous sulfate.... Will check and bring next time       . guaiFENesin (MUCINEX) 600 MG 12 hr tablet Take 1,200 mg by mouth as needed.        Marland Kitchen HYDROcodone-acetaminophen (NORCO) 10-325 MG per tablet Take 1 tablet by mouth every 8 (eight) hours as needed for pain.  60 tablet  2  . Loperamide HCl (IMODIUM PO) Take by mouth as directed.      Marland Kitchen omeprazole (PRILOSEC) 20 MG capsule Take 20 mg by mouth daily as needed.       . ondansetron (ZOFRAN) 8 MG tablet  Take 1 tablet by mouth every 8-12 hours as needed for severe nausea  30 tablet  3  . potassium chloride (K-DUR) 10 MEQ tablet Take 10 mEq by mouth 2 (two) times daily. Take 2 tabs twice a day      . promethazine (PHENERGAN) 25 MG suppository Place 1 suppository (25 mg total) rectally every 6 (six) hours as needed.  12 each  1  . promethazine (PHENERGAN) 25 MG tablet Take 1 tablet (25 mg total) by mouth every 6 (six) hours as needed.  60 tablet  3  . simvastatin (ZOCOR) 40 MG tablet Take 40 mg by mouth at bedtime.        . temazepam (RESTORIL) 30 MG capsule Take 1 capsule (30 mg total) by mouth at bedtime as needed for sleep.  30 capsule  2   No current facility-administered medications for this visit.     TREATMENT PROGRAM: -Revlimid 10 mg daily 3 weeks out of every 4 weeks. Revlimid was started in June 2010.   -Decadron 8 mg weekly. The patient takes this every Thursday. The patient has been on oral Decadron since June 2010.   -Zometa 3 mg about every 3 months from 09/15/2008 through 11/01/2010. 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   IMMUNIZATIONS:  Pneumovax was given in May 2010.  Flu shot was given on 03/25/2011.    SMOKING HISTORY: Patient has never smoked cigarettes.   HISTORY: Lindsey Dalton presents today for followup of her kappa light chain multiple myeloma.  She is accompanied by her granddaughter, Lindsey Dalton.  She was diagnosed with acute PE recently and is currently  on coumadin. She is off Revlimid now due to the PE. She complains of having a "rough week". She was significantly fatigued he just wanted to sit and sleep. She also didn't really what E. She occasionally feels chilly but has had no shaking chills or fever.  She is not having any swelling or pain in her legs.  She has mild chronic low back pain.  Her multiple myeloma markers have appeared to be slowly rising.   She is  complaining of being tired and fatigue after getting the valcade treatment, which is little bit unusual.  Blood pressure 135/87, pulse 108, temperature 98.4 F (36.9 C), temperature source Oral, resp. rate 20, height 5\' 4"  (1.626 m), weight 155 lb 4.8 oz (70.444 kg). PHYSICAL EXAMINATION:  General:  The patient is in a wheelchair. Breathing is slightly labored, although she is not on oxygen.  There has been no diarrhea, nausea, or vomiting.  She complains of weakness.  HEENT:  There is no scleral icterus.  Mouth and pharynx are benign.  The patient has dentures.  There is no adenopathy palpable.  Lungs:  Some very slight inspiratory rales and slight expiratory wheeze at the bases.  Cardiac: Regular rhythm without murmur or rub.  Abdomen:  Benign with the patient sitting in a wheelchair.  Extremities:  Trace to 1+ edema of both lateral ankles, but no significant pitting elsewhere.  Neurologic:  Exam was nonfocal.  The patient is alert and appropriate.  LABORATORY DATA:    CBC    Component Value Date/Time   WBC 6.5 12/13/2012 1423   WBC 13.3* 09/28/2008 0420   RBC 4.21 12/13/2012 1423   RBC 3.08* 09/28/2008 0420   HGB 13.9 12/13/2012 1423   HGB 9.9* 09/28/2008 0420   HCT 42.6 12/13/2012 1423   HCT 29.3* 09/28/2008 0420   PLT 174 12/13/2012 1423   PLT 178 09/28/2008 0420   MCV 101.2* 12/13/2012 1423   MCV 94.9 09/28/2008 0420   MCH 32.9 12/13/2012 1423   MCH 33.3 06/12/2010 1512   MCHC 32.5 12/13/2012 1423   MCHC 33.7 09/28/2008 0420   RDW 16.8* 12/13/2012 1423   RDW 17.0* 09/28/2008 0420   LYMPHSABS 1.0 12/13/2012 1423   LYMPHSABS 0.6* 09/21/2008 1040   MONOABS 1.2* 12/13/2012 1423   MONOABS 1.3* 09/21/2008 1040   EOSABS 0.1 12/13/2012 1423   EOSABS 0.0 09/21/2008 1040   BASOSABS 0.0 12/13/2012 1423   BASOSABS 0.1 09/21/2008 1040    Lab Results  Component Value Date   GLUCOSE 94 12/13/2012   BUN 10.2 12/13/2012   CO2 26 12/13/2012   ALT 17 12/13/2012   AST 23 12/13/2012   LDH 250* 11/25/2012   K 4.2 12/13/2012    CREATININE 0.8 12/13/2012   Also pending from today are quantitative immunoglobulins and serum light chains.  IMAGING STUDIES:  1. Metastatic bone survey carried out on 07/20/2009  showed osteopenia. There were some small lucent lesions within the  skull consistent with a either venous lakes or multiple myeloma  deposits. There were numerous wedge compression fractures and  degenerative changes throughout the spine. There was a wedge  compression of L3 of indeterminate age.  2. Chest x-ray, 2-view, from  07/25/2010 showed no active cardiopulmonary disease.  3. Metastatic bone  survey from 09/05/2010 showed unchanged osseous survey with a few lytic  lesions that were seen in the skull, possibly the right femur at the  junction of the middle and distal thirds. There were compression  fractures of the thoracic and lumbar spine unchanged. The patient is  status post vertebroplasty at T11, T12, and L1. A small lytic lesion  was seen in the scapula. There was a degenerative cyst in the greater  tuberosity of the left humerus.  4. Metastatic bone survey on 07/28/2011 showed minimal progression of small  lytic lesions in the skull compatible with a history of multiple  myeloma. There was no significant change in the small lytic lesions in  left scapula. There was probably better visualization of small lytic  lesions in the proximal left femur and poor visualization of a small  lytic lesion in the distal right femur. There was stable diffuse  osteopenia and multiple vertebral compression fractures.  5. Metastatic bone survey from 12/24/2011 showed multiple compression  fractures in the spine which are stable. There were tiny lytic  lesions seen in the humeri and in the left clavicle as well as the  left scapula. These were  felt to be stable. No impending  pathologic fractures.  6. Chest x-ray, 2 view, from 12/24/2011 showed no acute disease. 7. Bone density scan from 10/13/2012 showed a  T-score for the lumbar spine total of -0.5.  T-score of the right femoral neck was -2.8 and the T- score for the left femoral neck was -3.2.    IMPRESSION AND PLAN:    Lindsey Dalton is 31 y old female with Kappa light chain multiple myeloma diagnosed in April 2010 with 51%  plasma cells in the marrow. As stated above, the patient most recently has been taking Revlimid 10 mg daily, 3 weeks on 1, week off.  However, she developed PE and therefore, she was taken off Revlimid and  Was started on coumadin. The patient has a history of congestive heart failure.  On her last visit on 11/25/2012, we explained to patient and her family that the PE could be a side effect of the Revlimid and therefore, we would not recommend using it at least for the time being. Since she demonstrated a good response to valcade, we recommended starting valcade 1.3 mg/m2 weekly and consider switching the coumadin to [facility]. She received her fist valcade treatment on Monday 11/29/2012. She is complaining of being tired and fatigue, which is uncommon with valcade. Patient was discussed with Dr. Karel Jarvis. She will resume weekly Velcade treatments as scheduled starting 12/27/2012, 3 weeks on and one-week off. She will followup in 3 weeks with another symptom management visit and a CBC differential, C. met and LDH. Patient is also continue to take Decadron once a week.  A short question refills for her Phenergan and Zofran. Prescriptions for these medications was sent her pharmacy of record via E. scribed.  Laural Benes, Twanna Resh E, PA-C

## 2012-12-27 ENCOUNTER — Ambulatory Visit (HOSPITAL_BASED_OUTPATIENT_CLINIC_OR_DEPARTMENT_OTHER): Payer: Medicare Other

## 2012-12-27 ENCOUNTER — Other Ambulatory Visit (HOSPITAL_BASED_OUTPATIENT_CLINIC_OR_DEPARTMENT_OTHER): Payer: Medicare Other | Admitting: Lab

## 2012-12-27 ENCOUNTER — Other Ambulatory Visit: Payer: Self-pay | Admitting: Internal Medicine

## 2012-12-27 VITALS — HR 92 | Temp 97.7°F | Resp 19 | Ht 64.0 in

## 2012-12-27 DIAGNOSIS — C9000 Multiple myeloma not having achieved remission: Secondary | ICD-10-CM

## 2012-12-27 DIAGNOSIS — Z5112 Encounter for antineoplastic immunotherapy: Secondary | ICD-10-CM

## 2012-12-27 LAB — CBC WITH DIFFERENTIAL/PLATELET
BASO%: 0.5 % (ref 0.0–2.0)
EOS%: 3.7 % (ref 0.0–7.0)
HCT: 40.2 % (ref 34.8–46.6)
HGB: 13.4 g/dL (ref 11.6–15.9)
MCH: 33.6 pg (ref 25.1–34.0)
MCHC: 33.2 g/dL (ref 31.5–36.0)
MONO#: 1.1 10*3/uL — ABNORMAL HIGH (ref 0.1–0.9)
RDW: 16.9 % — ABNORMAL HIGH (ref 11.2–14.5)
WBC: 6.6 10*3/uL (ref 3.9–10.3)
lymph#: 1.7 10*3/uL (ref 0.9–3.3)

## 2012-12-27 LAB — COMPREHENSIVE METABOLIC PANEL (CC13)
ALT: 14 U/L (ref 0–55)
AST: 20 U/L (ref 5–34)
Albumin: 3.1 g/dL — ABNORMAL LOW (ref 3.5–5.0)
CO2: 28 mEq/L (ref 22–29)
Calcium: 9.4 mg/dL (ref 8.4–10.4)
Chloride: 108 mEq/L (ref 98–109)
Potassium: 4.3 mEq/L (ref 3.5–5.1)
Total Protein: 5.4 g/dL — ABNORMAL LOW (ref 6.4–8.3)

## 2012-12-27 MED ORDER — BORTEZOMIB CHEMO SQ INJECTION 3.5 MG (2.5MG/ML)
1.3000 mg/m2 | Freq: Once | INTRAMUSCULAR | Status: AC
  Administered 2012-12-27: 2.25 mg via SUBCUTANEOUS
  Filled 2012-12-27: qty 2.25

## 2012-12-27 MED ORDER — ONDANSETRON HCL 8 MG PO TABS
8.0000 mg | ORAL_TABLET | Freq: Once | ORAL | Status: AC
  Administered 2012-12-27: 8 mg via ORAL

## 2012-12-27 NOTE — Patient Instructions (Addendum)
[facility] Discharge Instructions for Patients Receiving Chemotherapy  Today you received the following chemotherapy agents: Velcade.  To help prevent nausea and vomiting after your treatment, we encourage you to take your nausea medication as prescribed.   If you develop nausea and vomiting that is not controlled by your nausea medication, call the clinic.   BELOW ARE SYMPTOMS THAT SHOULD BE REPORTED IMMEDIATELY:  *FEVER GREATER THAN 100.5 F  *CHILLS WITH OR WITHOUT FEVER  NAUSEA AND VOMITING THAT IS NOT CONTROLLED WITH YOUR NAUSEA MEDICATION  *UNUSUAL SHORTNESS OF BREATH  *UNUSUAL BRUISING OR BLEEDING  TENDERNESS IN MOUTH AND THROAT WITH OR WITHOUT PRESENCE OF ULCERS  *URINARY PROBLEMS  *BOWEL PROBLEMS  UNUSUAL RASH Items with * indicate a potential emergency and should be followed up as soon as possible.  Feel free to call the clinic you have any questions or concerns. The clinic phone number is [phone].    

## 2013-01-03 ENCOUNTER — Other Ambulatory Visit: Payer: Self-pay | Admitting: *Deleted

## 2013-01-03 ENCOUNTER — Ambulatory Visit (HOSPITAL_BASED_OUTPATIENT_CLINIC_OR_DEPARTMENT_OTHER): Payer: Medicare Other

## 2013-01-03 ENCOUNTER — Other Ambulatory Visit (HOSPITAL_BASED_OUTPATIENT_CLINIC_OR_DEPARTMENT_OTHER): Payer: Medicare Other | Admitting: Lab

## 2013-01-03 ENCOUNTER — Other Ambulatory Visit: Payer: Self-pay | Admitting: Oncology

## 2013-01-03 VITALS — BP 117/74 | HR 84 | Temp 97.7°F | Resp 20

## 2013-01-03 DIAGNOSIS — Z5112 Encounter for antineoplastic immunotherapy: Secondary | ICD-10-CM

## 2013-01-03 DIAGNOSIS — C9000 Multiple myeloma not having achieved remission: Secondary | ICD-10-CM

## 2013-01-03 LAB — CBC WITH DIFFERENTIAL/PLATELET
BASO%: 0.4 % (ref 0.0–2.0)
EOS%: 3.3 % (ref 0.0–7.0)
MCH: 33.1 pg (ref 25.1–34.0)
MCHC: 32.7 g/dL (ref 31.5–36.0)
NEUT%: 57.4 % (ref 38.4–76.8)
RBC: 4.17 10*6/uL (ref 3.70–5.45)
RDW: 16.6 % — ABNORMAL HIGH (ref 11.2–14.5)
WBC: 6.2 10*3/uL (ref 3.9–10.3)
lymph#: 1.4 10*3/uL (ref 0.9–3.3)

## 2013-01-03 LAB — COMPREHENSIVE METABOLIC PANEL (CC13)
Alkaline Phosphatase: 49 U/L (ref 40–150)
BUN: 7 mg/dL (ref 7.0–26.0)
Creatinine: 0.8 mg/dL (ref 0.6–1.1)
Glucose: 98 mg/dl (ref 70–140)
Total Bilirubin: 0.49 mg/dL (ref 0.20–1.20)

## 2013-01-03 MED ORDER — ONDANSETRON HCL 8 MG PO TABS
8.0000 mg | ORAL_TABLET | Freq: Once | ORAL | Status: AC
  Administered 2013-01-03: 8 mg via ORAL

## 2013-01-03 MED ORDER — BORTEZOMIB CHEMO SQ INJECTION 3.5 MG (2.5MG/ML)
1.3000 mg/m2 | Freq: Once | INTRAMUSCULAR | Status: AC
  Administered 2013-01-03: 2.25 mg via SUBCUTANEOUS
  Filled 2013-01-03: qty 2.25

## 2013-01-11 ENCOUNTER — Other Ambulatory Visit (HOSPITAL_BASED_OUTPATIENT_CLINIC_OR_DEPARTMENT_OTHER): Payer: Medicare Other | Admitting: Lab

## 2013-01-11 ENCOUNTER — Ambulatory Visit (HOSPITAL_BASED_OUTPATIENT_CLINIC_OR_DEPARTMENT_OTHER): Payer: Medicare Other | Admitting: Physician Assistant

## 2013-01-11 ENCOUNTER — Ambulatory Visit (HOSPITAL_BASED_OUTPATIENT_CLINIC_OR_DEPARTMENT_OTHER): Payer: Medicare Other

## 2013-01-11 ENCOUNTER — Encounter: Payer: Self-pay | Admitting: Physician Assistant

## 2013-01-11 ENCOUNTER — Telehealth: Payer: Self-pay | Admitting: Oncology

## 2013-01-11 VITALS — BP 126/84 | HR 95 | Temp 97.4°F | Resp 20 | Ht 64.0 in | Wt 151.6 lb

## 2013-01-11 DIAGNOSIS — I2699 Other pulmonary embolism without acute cor pulmonale: Secondary | ICD-10-CM

## 2013-01-11 DIAGNOSIS — C9000 Multiple myeloma not having achieved remission: Secondary | ICD-10-CM

## 2013-01-11 DIAGNOSIS — M545 Low back pain: Secondary | ICD-10-CM

## 2013-01-11 DIAGNOSIS — Z5112 Encounter for antineoplastic immunotherapy: Secondary | ICD-10-CM

## 2013-01-11 LAB — CBC WITH DIFFERENTIAL/PLATELET
Basophils Absolute: 0 10*3/uL (ref 0.0–0.1)
Eosinophils Absolute: 0.2 10*3/uL (ref 0.0–0.5)
HCT: 40.8 % (ref 34.8–46.6)
HGB: 13.5 g/dL (ref 11.6–15.9)
LYMPH%: 29.4 % (ref 14.0–49.7)
MCV: 101 fL (ref 79.5–101.0)
MONO%: 16.2 % — ABNORMAL HIGH (ref 0.0–14.0)
NEUT#: 3.1 10*3/uL (ref 1.5–6.5)
Platelets: 174 10*3/uL (ref 145–400)
RDW: 16.2 % — ABNORMAL HIGH (ref 11.2–14.5)

## 2013-01-11 LAB — COMPREHENSIVE METABOLIC PANEL (CC13)
Albumin: 3.1 g/dL — ABNORMAL LOW (ref 3.5–5.0)
Alkaline Phosphatase: 43 U/L (ref 40–150)
BUN: 9 mg/dL (ref 7.0–26.0)
Glucose: 108 mg/dl (ref 70–140)
Potassium: 4.6 mEq/L (ref 3.5–5.1)

## 2013-01-11 MED ORDER — ACYCLOVIR 400 MG PO TABS: 400.0000 mg | ORAL_TABLET | Freq: Two times a day (BID) | ORAL | Status: AC

## 2013-01-11 MED ORDER — SODIUM CHLORIDE 0.9 % IJ SOLN
10.0000 mL | INTRAMUSCULAR | Status: DC | PRN
  Administered 2013-01-11: 10 mL via INTRAVENOUS
  Filled 2013-01-11: qty 10

## 2013-01-11 MED ORDER — ONDANSETRON HCL 8 MG PO TABS
8.0000 mg | ORAL_TABLET | Freq: Once | ORAL | Status: AC
  Administered 2013-01-11: 8 mg via ORAL

## 2013-01-11 MED ORDER — NYSTATIN 100000 UNIT/GM EX POWD: CUTANEOUS | Status: AC

## 2013-01-11 MED ORDER — HEPARIN SOD (PORK) LOCK FLUSH 100 UNIT/ML IV SOLN
500.0000 [IU] | Freq: Once | INTRAVENOUS | Status: AC
  Administered 2013-01-11: 500 [IU] via INTRAVENOUS
  Filled 2013-01-11: qty 5

## 2013-01-11 MED ORDER — BORTEZOMIB CHEMO SQ INJECTION 3.5 MG (2.5MG/ML)
1.3000 mg/m2 | Freq: Once | INTRAMUSCULAR | Status: AC
  Administered 2013-01-11: 2.25 mg via SUBCUTANEOUS
  Filled 2013-01-11: qty 2.25

## 2013-01-11 NOTE — Progress Notes (Signed)
CC:   [name], M.D. [name]. [name], M.D. [name], MD, Fax [phone]  PROBLEM LIST:  1. Kappa light chain multiple myeloma diagnosed in April 2010 with 51%  plasma cells in the marrow. Kappa light chains evident on serum  and urine immunofixation electrophoresis and 8.3 gm of protein in  the urine. FISH studies showed 13q minus and 11:14 translocation  indicative of a poor prognosis. The patient initially was treated  with Velcade and Decadron from mid-May 2010 through mid-May 2011  with an excellent response. She also received Zometa from  09/15/08 to 11/01/10. The patient also has been receiving Revlimid since  June 2010 and has continued on Revlimid and Decadron  since that time in a program as described above. She has had an  excellent response to treatment. Last metastatic bone survey was  on 12/24/2011 and showed no significant changes when compared with  the prior study of 07/20/2011. There were a few lytic lesions, but  no evidence for progression. The patient's last 24-hour urine  collection was on 12/24/2011 and revealed 58 mg of protein.  The UIFE was negative for monoclonal light chains. We  have not repeated the bone marrow since the initial bone marrow of April  2010 which showed 51% plasma cells.  2. History of congestive heart failure with coronary artery disease  status post cardiac catheterizations in 2009.  3. History of diverticulosis and lower GI bleed in May 2010.  4. History of multiple compression fractures with balloon kyphoplasty  involving T11, T12, L1, on 08/17/2008.  5. Hypertension.    6. Dyslipidemia.  7. Hiatal hernia.  8. Severe degenerative joint disease.  9. Vitamin D deficiency.  10. 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  11. Chronic diarrhea occurring 5-6 times a day, 3-4 days a week, associated  with some incontinence. The patient states that diarrhea has been  ongoing since the time of her  myeloma diagnosis which actually dates  back 3 years. Apparently, the symptom is getting worse.  12. Right-sided Port-A-Cath placed on 04/09/2009.   MEDICATIONS:  Reviewed and recorded. Current Outpatient Prescriptions  Medication Sig Dispense Refill  . acyclovir (ZOVIRAX) 400 MG tablet Take 1 tablet (400 mg total) by mouth 2 (two) times daily.  60 tablet  3  . allopurinol (ZYLOPRIM) 100 MG tablet Take 2 tablets (200 mg total) by mouth daily.  180 tablet  3  . atenolol (TENORMIN) 50 MG tablet Take 50 mg by mouth daily.        . Attapulgite (KAOPECTATE PO) Take by mouth as directed.      . calcium citrate-vitamin D (CITRACAL+D) 315-200 MG-UNIT per tablet Take 1 tablet by mouth 2 (two) times daily.       . cholecalciferol (VITAMIN D) 1000 UNITS tablet Take 1,000 Units by mouth daily.        . citalopram (CELEXA) 10 MG tablet TAKE 1 TABLET DAILY.  30 tablet  3  . dexamethasone (DECADRON) 4 MG tablet Take 2 tabs po weekly  24 tablet  3  . diphenoxylate-atropine (LOMOTIL) 2.5-0.025 MG per tablet Take 1 tablet by mouth 2 (two) times daily.  60 tablet  1  . enoxaparin (LOVENOX) 100 MG/ML injection Inject 1 mL (100 mg total) into the skin daily.  10 Syringe  6  . ferrous gluconate (FERGON) 246 (28 FE) MG tablet Take 65 mg by mouth 2 (two) times daily. May be ferrous sulfate.... Will check and bring next  time       . furosemide (LASIX) 40 MG tablet Take 40 mg by mouth daily.      Marland Kitchen guaiFENesin (MUCINEX) 600 MG 12 hr tablet Take 1,200 mg by mouth as needed.        Marland Kitchen HYDROcodone-acetaminophen (NORCO) 10-325 MG per tablet Take 1 tablet by mouth every 8 (eight) hours as needed for pain.  60 tablet  2  . Loperamide HCl (IMODIUM PO) Take by mouth as directed.      . nystatin (MYCOSTATIN/NYSTOP) 100000 UNIT/GM POWD Apply to affected areas twice daily as instructed  1 Bottle  0  . ondansetron (ZOFRAN) 8 MG tablet Take 1 tablet by mouth every 8-12 hours as needed for severe nausea  30 tablet  3  . potassium  chloride (K-DUR) 10 MEQ tablet Take 10 mEq by mouth 2 (two) times daily. Take 2 tabs twice a day      . Potassium Chloride Crys CR (KLOR-CON M20 PO) Take 1 tablet by mouth 2 (two) times daily.      . promethazine (PHENERGAN) 25 MG suppository Place 1 suppository (25 mg total) rectally every 6 (six) hours as needed.  12 each  1  . promethazine (PHENERGAN) 25 MG tablet Take 1 tablet (25 mg total) by mouth every 6 (six) hours as needed.  60 tablet  3  . ranitidine (ZANTAC) 75 MG tablet Take 75 mg by mouth at bedtime.      . simvastatin (ZOCOR) 20 MG tablet Take 20 mg by mouth every evening.      . temazepam (RESTORIL) 15 MG capsule Take 15 mg by mouth at bedtime as needed for sleep.       No current facility-administered medications for this visit.     TREATMENT PROGRAM: -Revlimid 10 mg daily 3 weeks out of every 4 weeks. Revlimid was started in June 2010.   -Decadron 8 mg weekly. The patient takes this every Thursday. The patient has been on oral Decadron since June 2010.   -Zometa 3 mg about every 3 months from 09/15/2008 through 11/01/2010. 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   IMMUNIZATIONS:  Pneumovax was given in May 2010.  Flu shot was given on 03/25/2011.    SMOKING HISTORY: Patient has never smoked cigarettes.   HISTORY: We are seeing Lindsey Dalton today for followup of her kappa light chain multiple myeloma.  She is accompanied by her granddaughter, Lindsey Dalton.  She was diagnosed with acute PE recently and was initially treated with Coumadin but has been switched to Lovenox. She continues to have a lot of subcutaneous bruising related to the Lovenox injection including some firm subcutaneous nodules which are quite tender. Additionally she continues to get her subcutaneous Velcade in the abdomen as well. She has been on the Lovenox for approximately the past 6 weeks. Her complaints of fatigue have  remained stable with no increasing fatigue. Her granddaughter states that it was their understanding that Lindsey Dalton was supposed to be on acyclovir however they've never received his prescription. Also is been quite some time since her Port-A-Cath has been flushed. His [place] reports decreased appetite and therefore her by mouth intake has been decreased as well. She currently enjoys vanilla ensure nutritional supplement but is only drinking one of these daily. She is off Revlimid now due to the PE and as previously stated has been treated with subcutaneous Velcade with weekly dexamethasone at a dose of 8 mg which  she has been on since June of 2010. Of note she remains on aspirin at 81 mg by mouth daily. She underwent workup by her cardiologist a couple of months ago, apparently an exercise test. The patient denies any fever or chills. She is not having any swelling or pain in her legs.  She has mild chronic low back pain.  Her multiple myeloma markers have appeared to be slowly rising, last checked 12/13/2012. She denied nausea, vomiting, diarrhea or constipation.    Blood pressure 126/84, pulse 95, temperature 97.4 F (36.3 C), temperature source Oral, resp. rate 20, height 5\' 4"  (1.626 m), weight 151 lb 9.6 oz (68.765 kg). PHYSICAL EXAMINATION:  General: Awake, alert in no acute distress ambulating well about the exam room without assistance  Breathing is unlabored,    HEENT:  There is no scleral icterus.  Mouth and pharynx are benign.  The patient has dentures.  There is no adenopathy palpable.  Lungs:  Clear to auscultation without wheezes rales or rhonchi Some very slight Cardiac:Regular rhythm without murmur or rub.  Abdomen: Right lower abdomen reveals ecchymosis of varying coloration with a firm tender nodules who is some gravity pooling there is some improving/resolution of the left lower abdominal ecchymosis but there remains some tender nodules in this area as well the left groin  reveals some skin break down with some erythema and signs concerning for cutaneous yeast  Extremities:  Trace to 1+ edema of both lateral ankles, but no significant pitting elsewhere.  Neurologic:  Exam was nonfocal.  The patient is alert and appropriate.  LABORATORY DATA:    CBC    Component Value Date/Time   WBC 6.2 01/11/2013 1455   WBC 13.3* 09/28/2008 0420   RBC 4.04 01/11/2013 1455   RBC 3.08* 09/28/2008 0420   HGB 13.5 01/11/2013 1455   HGB 9.9* 09/28/2008 0420   HCT 40.8 01/11/2013 1455   HCT 29.3* 09/28/2008 0420   PLT 174 01/11/2013 1455   PLT 178 09/28/2008 0420   MCV 101.0 01/11/2013 1455   MCV 94.9 09/28/2008 0420   MCH 33.4 01/11/2013 1455   MCH 33.3 06/12/2010 1512   MCHC 33.1 01/11/2013 1455   MCHC 33.7 09/28/2008 0420   RDW 16.2* 01/11/2013 1455   RDW 17.0* 09/28/2008 0420   LYMPHSABS 1.8 01/11/2013 1455   LYMPHSABS 0.6* 09/21/2008 1040   MONOABS 1.0* 01/11/2013 1455   MONOABS 1.3* 09/21/2008 1040   EOSABS 0.2 01/11/2013 1455   EOSABS 0.0 09/21/2008 1040   BASOSABS 0.0 01/11/2013 1455   BASOSABS 0.1 09/21/2008 1040    Lab Results  Component Value Date   GLUCOSE 108 01/11/2013   BUN 9.0 01/11/2013   CO2 29 01/11/2013   ALT 14 01/11/2013   AST 21 01/11/2013   LDH 250* 11/25/2012   K 4.6 01/11/2013   CREATININE 0.8 01/11/2013     IMAGING STUDIES:  1. Metastatic bone survey carried out on 07/20/2009  showed osteopenia. There were some small lucent lesions within the  skull consistent with a either venous lakes or multiple myeloma  deposits. There were numerous wedge compression fractures and  degenerative changes throughout the spine. There was a wedge  compression of L3 of indeterminate age.  2. Chest x-ray, 2-view, from  07/25/2010 showed no active cardiopulmonary disease.  3. Metastatic bone  survey from 09/05/2010 showed unchanged osseous survey with a few lytic  lesions that were seen in the skull, possibly the right femur at the  junction of the middle and  distal thirds. There were  compression  fractures of the thoracic and lumbar spine unchanged. The patient is  status post vertebroplasty at T11, T12, and L1. A small lytic lesion  was seen in the scapula. There was a degenerative cyst in the greater  tuberosity of the left humerus.  4. Metastatic bone survey on 07/28/2011 showed minimal progression of small  lytic lesions in the skull compatible with a history of multiple  myeloma. There was no significant change in the small lytic lesions in  left scapula. There was probably better visualization of small lytic  lesions in the proximal left femur and poor visualization of a small  lytic lesion in the distal right femur. There was stable diffuse  osteopenia and multiple vertebral compression fractures.  5. Metastatic bone survey from 12/24/2011 showed multiple compression  fractures in the spine which are stable. There were tiny lytic  lesions seen in the humeri and in the left clavicle as well as the  left scapula. These were felt to be stable. No impending  pathologic fractures.  6. Chest x-ray, 2 view, from 12/24/2011 showed no acute disease. 7. Bone density scan from 10/13/2012 showed a T-score for the lumbar spine total of -0.5.  T-score of the right femoral neck was -2.8 and the T- score for the left femoral neck was -3.2.    IMPRESSION AND PLAN:    Ms Lindsey Dalton is 52 y old female with Kappa light chain multiple myeloma diagnosed in April 2010 with 51%  plasma cells in the marrow. As stated above, the patient most recently has been taking Revlimid 10 mg daily, 3 weeks on 1, week off.  However, she developed PE and therefore, she was taken off Revlimid and  Was initially started on coumadin but was changed to Lovenox currently 100 mg subcutaneously once daily. The patient has a history of congestive heart failure. Due to the pulmonary embolism she was taken off Revlimid and has been started on maintenance treatment with Velcade at 1.3 mg per meter  squared given weekly she continues on her weekly dexamethasone at 8 mg. The patient was discussed with and also seen by Dr. Baltazar Apo. The amount of subcutaneous bruising and pooling in her abdomen is concerning. We have asked him the patient to discontinue the 81 mg aspirin as this also may be contributing to the excessive ecchymosis. We have checked with pharmacy and she may use her anterior lateral thigh region for her Lovenox injections. We will monitor the thigh region closely for a similar signs of ecchymosis and blood pooling. Should this occur we will consider sending a anti-factor Xa level. This study and has to be sent off to [facility]. A prescription for acyclovir 400 mg by mouth twice daily sent to her pharmacy of record to lift of 60 capsules with 3 refills. Additionally a prescription for nystatin powder to be applied to the intratracheal areas twice daily as instructed was also sent to her pharmacy of record via E. scribed. Patient was instructed to dry these areas very carefully prior to applying the antifungal powder. Her Port-A-Cath was flushed today and will be scheduled to be flushed regularly moving forward. She'll continue with her weekly subcutaneous Velcade injections and weekly dexamethasone as scheduled and return in 3 weeks for another symptom management visit with a CBC differential, C. met and LDH.   Conni Slipper, PA-C 01/11/2013 11:46 PM

## 2013-01-11 NOTE — Telephone Encounter (Signed)
gv and printed appt sched and avs for pt for SEpt...sent emailed to MW to add tx.

## 2013-01-11 NOTE — Progress Notes (Signed)
Pt did not have flush appointments scheduled.  Sent POF to scheduling.  Flushed port today.

## 2013-01-11 NOTE — Patient Instructions (Addendum)
Stop taking your 81 mg aspirin. You may use the  anterior portion of your thigh  for your Lovenox injections as instructed Take your acyclovir as prescribed Used any antifungal powder in the skin folds in your groin area, be sure to dry these areas very carefully as instructed prior to applying the antifungal powder Continue your weekly oral dexamethasone and Velcade in injections Followup in 3 weeks

## 2013-01-12 ENCOUNTER — Telehealth: Payer: Self-pay | Admitting: *Deleted

## 2013-01-12 NOTE — Telephone Encounter (Signed)
Per staff message and POF I have scheduled appts.  JMW  

## 2013-01-13 NOTE — Progress Notes (Signed)
  ADDENDUM:  Ms LAKAYA TOLEN is a pleasant 55 y old female w CHF and  Kappa light chain multiple myeloma (08/2008) off Revlimid due to PE (on lovenox sq) also  now on maintenance w Velcade (1.3 mg/m^2) SQ weekly and low dose Decadron 8 mg PO is here for 3 week follow up.  Today, she has abdominal ecchymosis due to both injections.  Given these findings, we recommended per discussion with pharmacy to give her lovenox injections to the anterior lateral thigh region.  We counseled the patient extensively to make [place] aware of worsening ecchymosis despite changing sites.  We may at that time check her anti-factor Xa level to ensure the dose of lovenox is within range given her age.  A prescription for acyclovir prophylaxis was also provided.  Patient will followup in 3 weeks for labs including CBC, CMP and LDH.  Answers were provided to her questions satisfactorily.     I personally saw this patient and performed a substantive portion of this encounter with the listed APP documented above.   Takyla Kuchera, MD

## 2013-01-17 ENCOUNTER — Other Ambulatory Visit (HOSPITAL_BASED_OUTPATIENT_CLINIC_OR_DEPARTMENT_OTHER): Payer: Medicare Other | Admitting: Lab

## 2013-01-17 ENCOUNTER — Ambulatory Visit (HOSPITAL_BASED_OUTPATIENT_CLINIC_OR_DEPARTMENT_OTHER): Payer: Medicare Other

## 2013-01-17 VITALS — BP 156/80 | HR 80 | Temp 98.6°F

## 2013-01-17 DIAGNOSIS — C9000 Multiple myeloma not having achieved remission: Secondary | ICD-10-CM

## 2013-01-17 DIAGNOSIS — Z5112 Encounter for antineoplastic immunotherapy: Secondary | ICD-10-CM

## 2013-01-17 LAB — CBC WITH DIFFERENTIAL/PLATELET
BASO%: 0.4 % (ref 0.0–2.0)
Eosinophils Absolute: 0.3 10*3/uL (ref 0.0–0.5)
LYMPH%: 25.2 % (ref 14.0–49.7)
MCHC: 32.6 g/dL (ref 31.5–36.0)
MCV: 102.2 fL — ABNORMAL HIGH (ref 79.5–101.0)
MONO#: 1 10*3/uL — ABNORMAL HIGH (ref 0.1–0.9)
MONO%: 16.1 % — ABNORMAL HIGH (ref 0.0–14.0)
NEUT#: 3.2 10*3/uL (ref 1.5–6.5)
RBC: 4.02 10*6/uL (ref 3.70–5.45)
RDW: 16 % — ABNORMAL HIGH (ref 11.2–14.5)
WBC: 6 10*3/uL (ref 3.9–10.3)

## 2013-01-17 LAB — COMPREHENSIVE METABOLIC PANEL (CC13)
ALT: 14 U/L (ref 0–55)
Albumin: 3 g/dL — ABNORMAL LOW (ref 3.5–5.0)
Alkaline Phosphatase: 43 U/L (ref 40–150)
Glucose: 100 mg/dl (ref 70–140)
Potassium: 4.7 mEq/L (ref 3.5–5.1)
Sodium: 144 mEq/L (ref 136–145)
Total Bilirubin: 0.46 mg/dL (ref 0.20–1.20)
Total Protein: 5 g/dL — ABNORMAL LOW (ref 6.4–8.3)

## 2013-01-17 MED ORDER — ONDANSETRON HCL 8 MG PO TABS
ORAL_TABLET | ORAL | Status: AC
  Filled 2013-01-17: qty 1

## 2013-01-17 MED ORDER — ONDANSETRON HCL 8 MG PO TABS
8.0000 mg | ORAL_TABLET | Freq: Once | ORAL | Status: AC
  Administered 2013-01-17: 8 mg via ORAL

## 2013-01-17 MED ORDER — BORTEZOMIB CHEMO SQ INJECTION 3.5 MG (2.5MG/ML)
1.3000 mg/m2 | Freq: Once | INTRAMUSCULAR | Status: AC
  Administered 2013-01-17: 2.25 mg via SUBCUTANEOUS
  Filled 2013-01-17: qty 2.25

## 2013-01-17 NOTE — Patient Instructions (Addendum)
[facility] Discharge Instructions for Patients Receiving Chemotherapy  Today you received the following chemotherapy agents Velcade.  To help prevent nausea and vomiting after your treatment, we encourage you to take your nausea medication as directed.    If you develop nausea and vomiting that is not controlled by your nausea medication, call the clinic.   BELOW ARE SYMPTOMS THAT SHOULD BE REPORTED IMMEDIATELY:  *FEVER GREATER THAN 100.5 F  *CHILLS WITH OR WITHOUT FEVER  NAUSEA AND VOMITING THAT IS NOT CONTROLLED WITH YOUR NAUSEA MEDICATION  *UNUSUAL SHORTNESS OF BREATH  *UNUSUAL BRUISING OR BLEEDING  TENDERNESS IN MOUTH AND THROAT WITH OR WITHOUT PRESENCE OF ULCERS  *URINARY PROBLEMS  *BOWEL PROBLEMS  UNUSUAL RASH Items with * indicate a potential emergency and should be followed up as soon as possible.  Feel free to call the clinic you have any questions or concerns. The clinic phone number is [phone].    

## 2013-01-21 ENCOUNTER — Encounter: Payer: Self-pay | Admitting: *Deleted

## 2013-01-24 ENCOUNTER — Telehealth: Payer: Self-pay | Admitting: Internal Medicine

## 2013-01-24 ENCOUNTER — Ambulatory Visit (HOSPITAL_BASED_OUTPATIENT_CLINIC_OR_DEPARTMENT_OTHER): Payer: Medicare Other

## 2013-01-24 ENCOUNTER — Other Ambulatory Visit (HOSPITAL_BASED_OUTPATIENT_CLINIC_OR_DEPARTMENT_OTHER): Payer: Medicare Other | Admitting: Lab

## 2013-01-24 VITALS — BP 102/72 | HR 96 | Temp 97.7°F | Resp 20

## 2013-01-24 DIAGNOSIS — C9000 Multiple myeloma not having achieved remission: Secondary | ICD-10-CM

## 2013-01-24 DIAGNOSIS — Z5112 Encounter for antineoplastic immunotherapy: Secondary | ICD-10-CM

## 2013-01-24 LAB — CBC WITH DIFFERENTIAL/PLATELET
BASO%: 0.4 % (ref 0.0–2.0)
EOS%: 3.2 % (ref 0.0–7.0)
HCT: 40.5 % (ref 34.8–46.6)
LYMPH%: 21 % (ref 14.0–49.7)
MCH: 33 pg (ref 25.1–34.0)
MCHC: 32.6 g/dL (ref 31.5–36.0)
MONO%: 17.8 % — ABNORMAL HIGH (ref 0.0–14.0)
NEUT%: 57.6 % (ref 38.4–76.8)
Platelets: 147 10*3/uL (ref 145–400)

## 2013-01-24 LAB — COMPREHENSIVE METABOLIC PANEL (CC13)
ALT: 15 U/L (ref 0–55)
AST: 25 U/L (ref 5–34)
Alkaline Phosphatase: 42 U/L (ref 40–150)
Creatinine: 0.8 mg/dL (ref 0.6–1.1)
Total Bilirubin: 0.44 mg/dL (ref 0.20–1.20)

## 2013-01-24 MED ORDER — ONDANSETRON HCL 8 MG PO TABS
8.0000 mg | ORAL_TABLET | Freq: Once | ORAL | Status: AC
  Administered 2013-01-24: 8 mg via ORAL

## 2013-01-24 MED ORDER — BORTEZOMIB CHEMO SQ INJECTION 3.5 MG (2.5MG/ML)
1.3000 mg/m2 | Freq: Once | INTRAMUSCULAR | Status: AC
  Administered 2013-01-24: 2.25 mg via SUBCUTANEOUS
  Filled 2013-01-24: qty 2.25

## 2013-01-24 MED ORDER — ONDANSETRON HCL 8 MG PO TABS
ORAL_TABLET | ORAL | Status: AC
  Filled 2013-01-24: qty 1

## 2013-01-24 NOTE — Telephone Encounter (Signed)
Faxed pt medical records to Dr. Holwerda °

## 2013-01-24 NOTE — Patient Instructions (Addendum)
[facility] Discharge Instructions for Patients Receiving Chemotherapy  Today you received the following chemotherapy agents: Velcade.  To help prevent nausea and vomiting after your treatment, we encourage you to take your nausea medication as prescribed.   If you develop nausea and vomiting that is not controlled by your nausea medication, call the clinic.   BELOW ARE SYMPTOMS THAT SHOULD BE REPORTED IMMEDIATELY:  *FEVER GREATER THAN 100.5 F  *CHILLS WITH OR WITHOUT FEVER  NAUSEA AND VOMITING THAT IS NOT CONTROLLED WITH YOUR NAUSEA MEDICATION  *UNUSUAL SHORTNESS OF BREATH  *UNUSUAL BRUISING OR BLEEDING  TENDERNESS IN MOUTH AND THROAT WITH OR WITHOUT PRESENCE OF ULCERS  *URINARY PROBLEMS  *BOWEL PROBLEMS  UNUSUAL RASH Items with * indicate a potential emergency and should be followed up as soon as possible.  Feel free to call the clinic you have any questions or concerns. The clinic phone number is [phone].    

## 2013-01-25 ENCOUNTER — Telehealth: Payer: Self-pay | Admitting: Dietician

## 2013-01-25 NOTE — Telephone Encounter (Signed)
Brief Outpatient Oncology Nutrition Note  Patient has been identified to be at risk on malnutrition screen.  Wt Readings from Last 10 Encounters:  01/11/13 151 lb 9.6 oz (68.765 kg)  12/20/12 155 lb 4.8 oz (70.444 kg)  12/13/12 156 lb 1.6 oz (70.806 kg)  11/25/12 152 lb 8 oz (69.174 kg)  10/14/12 149 lb 1.6 oz (67.631 kg)  06/21/12 156 lb 11.2 oz (71.079 kg)  03/29/12 164 lb 11.2 oz (74.707 kg)  03/01/12 155 lb (70.308 kg)  03/01/12 155 lb (70.308 kg)  01/27/12 157 lb (71.215 kg)   Called patient due to continued weight loss.  Patient reports a very poor appetite and is eating very little but is trying to drink 3 Ensure daily.  Will mail patient fact sheets, "Poor Appetite" and "Increasing Your Calories and Protein" along with Ensure coupons and [facility] fact sheets.  Oran Rein, RD, LDN

## 2013-01-28 ENCOUNTER — Encounter: Payer: Self-pay | Admitting: Internal Medicine

## 2013-01-28 NOTE — Progress Notes (Signed)
PAN approved Velcade 01/26/13-01/25/14  10,000.00. I will send to medical records and billing.

## 2013-01-31 ENCOUNTER — Ambulatory Visit (HOSPITAL_BASED_OUTPATIENT_CLINIC_OR_DEPARTMENT_OTHER): Payer: Medicare Other | Admitting: Physician Assistant

## 2013-01-31 ENCOUNTER — Ambulatory Visit: Payer: Medicare Other

## 2013-01-31 ENCOUNTER — Other Ambulatory Visit (HOSPITAL_BASED_OUTPATIENT_CLINIC_OR_DEPARTMENT_OTHER): Payer: Medicare Other | Admitting: Lab

## 2013-01-31 ENCOUNTER — Encounter: Payer: Self-pay | Admitting: Physician Assistant

## 2013-01-31 ENCOUNTER — Other Ambulatory Visit: Payer: Medicare Other | Admitting: Lab

## 2013-01-31 ENCOUNTER — Ambulatory Visit (HOSPITAL_BASED_OUTPATIENT_CLINIC_OR_DEPARTMENT_OTHER): Payer: Medicare Other

## 2013-01-31 VITALS — BP 137/91 | HR 120 | Temp 97.8°F | Resp 18 | Ht 64.0 in | Wt 150.9 lb

## 2013-01-31 DIAGNOSIS — C9000 Multiple myeloma not having achieved remission: Secondary | ICD-10-CM

## 2013-01-31 DIAGNOSIS — Z5112 Encounter for antineoplastic immunotherapy: Secondary | ICD-10-CM

## 2013-01-31 LAB — CBC WITH DIFFERENTIAL/PLATELET
BASO%: 0.3 % (ref 0.0–2.0)
HCT: 40.2 % (ref 34.8–46.6)
LYMPH%: 22.6 % (ref 14.0–49.7)
MCH: 33.2 pg (ref 25.1–34.0)
MCHC: 32.6 g/dL (ref 31.5–36.0)
MONO#: 1.2 10*3/uL — ABNORMAL HIGH (ref 0.1–0.9)
NEUT%: 56 % (ref 38.4–76.8)
Platelets: 182 10*3/uL (ref 145–400)
WBC: 6.4 10*3/uL (ref 3.9–10.3)

## 2013-01-31 LAB — COMPREHENSIVE METABOLIC PANEL (CC13)
ALT: 11 U/L (ref 0–55)
CO2: 27 mEq/L (ref 22–29)
Creatinine: 0.8 mg/dL (ref 0.6–1.1)
Total Bilirubin: 0.49 mg/dL (ref 0.20–1.20)

## 2013-01-31 MED ORDER — ONDANSETRON HCL 8 MG PO TABS
8.0000 mg | ORAL_TABLET | Freq: Once | ORAL | Status: AC
  Administered 2013-01-31: 8 mg via ORAL

## 2013-01-31 MED ORDER — BORTEZOMIB CHEMO SQ INJECTION 3.5 MG (2.5MG/ML)
1.3000 mg/m2 | Freq: Once | INTRAMUSCULAR | Status: AC
  Administered 2013-01-31: 2.25 mg via SUBCUTANEOUS
  Filled 2013-01-31: qty 2.25

## 2013-01-31 MED ORDER — ONDANSETRON HCL 8 MG PO TABS
ORAL_TABLET | ORAL | Status: AC
  Filled 2013-01-31: qty 1

## 2013-01-31 NOTE — Progress Notes (Signed)
Lindsey Dalton is a pleasant 89 y old female w CHF and Kappa light chain multiple myeloma (08/2008) off Revlimid due to PE (on lovenox sq) also now on maintenance w Velcade (1.3 mg/m^2) SQ weekly and low dose Decadron 8 mg PO is here for 3 week follow up. Today, her abdominal ecchymosis has improved substantially with a few noted subcutaneous nodules. She will continue her lovenox injections to the anterior lateral thigh region. Patient will followup in 3 weeks for labs including CBC, CMP and LDH and kappa/lambda light chains. Answers were provided to her questions satisfactorily.   I personally saw this patient and performed a substantive portion of this encounter with the listed APP documented above.

## 2013-01-31 NOTE — Patient Instructions (Signed)
[facility] Discharge Instructions for Patients Receiving Chemotherapy  Today you received the following chemotherapy agents: Velcade.   To help prevent nausea and vomiting after your treatment, we encourage you to take your nausea medication as needed.   If you develop nausea and vomiting that is not controlled by your nausea medication, call the clinic.   BELOW ARE SYMPTOMS THAT SHOULD BE REPORTED IMMEDIATELY:  *FEVER GREATER THAN 100.5 F  *CHILLS WITH OR WITHOUT FEVER  NAUSEA AND VOMITING THAT IS NOT CONTROLLED WITH YOUR NAUSEA MEDICATION  *UNUSUAL SHORTNESS OF BREATH  *UNUSUAL BRUISING OR BLEEDING  TENDERNESS IN MOUTH AND THROAT WITH OR WITHOUT PRESENCE OF ULCERS  *URINARY PROBLEMS  *BOWEL PROBLEMS  UNUSUAL RASH Items with * indicate a potential emergency and should be followed up as soon as possible.  Feel free to call the clinic should you have any questions or concerns. The clinic phone number is [phone].    

## 2013-01-31 NOTE — Progress Notes (Signed)
CC:   [name], M.D. [name]. [name], M.D. [name], MD, Fax [phone]  PROBLEM LIST:  1. Kappa light chain multiple myeloma diagnosed in April 2010 with 51%  plasma cells in the marrow. Kappa light chains evident on serum  and urine immunofixation electrophoresis and 8.3 gm of protein in  the urine. FISH studies showed 13q minus and 11:14 translocation  indicative of a poor prognosis. The patient initially was treated  with Velcade and Decadron from mid-May 2010 through mid-May 2011  with an excellent response. She also received Zometa from  09/15/08 to 11/01/10. The patient also has been receiving Revlimid since  June 2010 and has continued on Revlimid and Decadron  since that time in a program as described above. She has had an  excellent response to treatment. Last metastatic bone survey was  on 12/24/2011 and showed no significant changes when compared with  the prior study of 07/20/2011. There were a few lytic lesions, but  no evidence for progression. The patient's last 24-hour urine  collection was on 12/24/2011 and revealed 58 mg of protein.  The UIFE was negative for monoclonal light chains. We  have not repeated the bone marrow since the initial bone marrow of April  2010 which showed 51% plasma cells.  2. History of congestive heart failure with coronary artery disease  status post cardiac catheterizations in 2009.  3. History of diverticulosis and lower GI bleed in May 2010.  4. History of multiple compression fractures with balloon kyphoplasty  involving T11, T12, L1, on 08/17/2008.  5. Hypertension.    6. Dyslipidemia.  7. Hiatal hernia.  8. Severe degenerative joint disease.  9. Vitamin D deficiency.  10. 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  11. Chronic diarrhea occurring 5-6 times a day, 3-4 days a week, associated  with some incontinence. The patient states that diarrhea has been  ongoing since the time of her  myeloma diagnosis which actually dates  back 3 years. Apparently, the symptom is getting worse.  12. Right-sided Port-A-Cath placed on 04/09/2009.   MEDICATIONS:  Reviewed and recorded. Current Outpatient Prescriptions  Medication Sig Dispense Refill  . acyclovir (ZOVIRAX) 400 MG tablet Take 1 tablet (400 mg total) by mouth 2 (two) times daily.  60 tablet  3  . allopurinol (ZYLOPRIM) 100 MG tablet Take 2 tablets (200 mg total) by mouth daily.  180 tablet  3  . atenolol (TENORMIN) 50 MG tablet Take 50 mg by mouth daily.        . Attapulgite (KAOPECTATE PO) Take by mouth as directed.      . calcium citrate-vitamin D (CITRACAL+D) 315-200 MG-UNIT per tablet Take 1 tablet by mouth 2 (two) times daily.       . cholecalciferol (VITAMIN D) 1000 UNITS tablet Take 1,000 Units by mouth daily.        . citalopram (CELEXA) 10 MG tablet TAKE 1 TABLET DAILY.  30 tablet  3  . dexamethasone (DECADRON) 4 MG tablet Take 2 tabs po weekly  24 tablet  3  . diphenoxylate-atropine (LOMOTIL) 2.5-0.025 MG per tablet Take 1 tablet by mouth 2 (two) times daily.  60 tablet  1  . enoxaparin (LOVENOX) 100 MG/ML injection Inject 1 mL (100 mg total) into the skin daily.  10 Syringe  6  . ferrous gluconate (FERGON) 246 (28 FE) MG tablet Take 65 mg by mouth 2 (two) times daily. May be ferrous sulfate.... Will check and bring next  time       . furosemide (LASIX) 40 MG tablet Take 40 mg by mouth daily.      Marland Kitchen guaiFENesin (MUCINEX) 600 MG 12 hr tablet Take 1,200 mg by mouth as needed.        Marland Kitchen HYDROcodone-acetaminophen (NORCO) 10-325 MG per tablet Take 1 tablet by mouth every 8 (eight) hours as needed for pain.  60 tablet  2  . Loperamide HCl (IMODIUM PO) Take by mouth as directed.      . nystatin (MYCOSTATIN/NYSTOP) 100000 UNIT/GM POWD Apply to affected areas twice daily as instructed  1 Bottle  0  . ondansetron (ZOFRAN) 8 MG tablet Take 1 tablet by mouth every 8-12 hours as needed for severe nausea  30 tablet  3  . potassium  chloride (K-DUR) 10 MEQ tablet Take 10 mEq by mouth 2 (two) times daily. Take 2 tabs twice a day      . Potassium Chloride Crys CR (KLOR-CON M20 PO) Take 1 tablet by mouth 2 (two) times daily.      . promethazine (PHENERGAN) 25 MG suppository Place 1 suppository (25 mg total) rectally every 6 (six) hours as needed.  12 each  1  . promethazine (PHENERGAN) 25 MG tablet Take 1 tablet (25 mg total) by mouth every 6 (six) hours as needed.  60 tablet  3  . ranitidine (ZANTAC) 75 MG tablet Take 75 mg by mouth at bedtime.      . simvastatin (ZOCOR) 20 MG tablet Take 20 mg by mouth every evening.      . temazepam (RESTORIL) 15 MG capsule Take 15 mg by mouth at bedtime as needed for sleep.       No current facility-administered medications for this visit.     TREATMENT PROGRAM: -Revlimid 10 mg daily 3 weeks out of every 4 weeks. Revlimid was started in June 2010.   -Decadron 8 mg weekly. The patient takes this every Thursday. The patient has been on oral Decadron since June 2010.   -Zometa 3 mg about every 3 months from 09/15/2008 through 11/01/2010. 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   IMMUNIZATIONS:  Pneumovax was given in May 2010.  Flu shot was given on 03/25/2011.    SMOKING HISTORY: Patient has never smoked cigarettes.   HISTORY: We are seeing Lindsey Dalton today for followup of her kappa light chain multiple myeloma.  She is accompanied by her granddaughter.  She was diagnosed with acute PE recently and was initially treated with Coumadin but has been switched to Lovenox. She continues to have a lot of subcutaneous bruising related to the Lovenox injection including some firm subcutaneous nodules which are quite tender. Additionally she continues to get her subcutaneous Velcade in the abdomen as well. At her last visit on 01/11/2013 and we asked her to discontinue giving her self her Lovenox injections in the  abdomen and to use her anterior lateral thigh region for these injections. Allowing the abdomen to heal. She states that the bruising is significantly better on her abdomen although she is experiencing some bruising on her thighs it is not to the extent that she was having on her abdomen. Her complaints of fatigue have remained stable with no increasing fatigue. She is now taking the acyclovir as prescribed. Lindsey. Barbato continues to report decreased appetite and therefore her by mouth intake has been decreased as well. She is off Revlimid now due to the PE and as previously stated  has been treated with subcutaneous Velcade with weekly dexamethasone at a dose of 8 mg which she has been on since June of 2010. Of note she remains on aspirin at 81 mg by mouth daily.  The patient denies any fever or chills. She is not having any swelling or pain in her legs.  She has mild chronic low back pain.  Her multiple myeloma markers have appeared to be slowly rising, last checked 12/13/2012. It should be noted that the studies were drawn after she had only been on weekly Velcade for 2 weeks and is more a reflection of the previous Revlimid treatment. She denied nausea, vomiting, diarrhea or constipation.    Blood pressure 137/91, pulse 120, temperature 97.8 F (36.6 C), temperature source Oral, resp. rate 18, height 5\' 4"  (1.626 m), weight 150 lb 14.4 oz (68.448 kg). PHYSICAL EXAMINATION:  General: Awake, alert in no acute distress ambulating well about the exam room without assistance  Breathing is unlabored,    HEENT:  There is no scleral icterus.  Mouth and pharynx are benign.  The patient has dentures.  There is no adenopathy palpable.  Lungs:  Clear to auscultation without wheezes rales or rhonchi Some very slight Cardiac:Regular rhythm without murmur or rub.  Abdomen: Right lower abdomen reveals resolved ecchymosis there continues to be some hyperpigmented firm subcutaneous nodules however these are no longer  tender. There is a similar appearance to the left lower abdomen. Skin:The previously seen skin breakdown in the left groin has resolved. There is some mild ecchymosis on the anterior lateral thigh obviously where she's give herself injections there is only one area of minimal blood pooling/ecchymosis and these areas are nontender Extremities:  Trace edema of both lateral ankles, but no significant pitting elsewhere.  Neurologic:  Exam was nonfocal.  The patient is alert and appropriate.  LABORATORY DATA:    CBC    Component Value Date/Time   WBC 6.4 01/31/2013 1448   WBC 13.3* 09/28/2008 0420   RBC 3.95 01/31/2013 1448   RBC 3.08* 09/28/2008 0420   HGB 13.1 01/31/2013 1448   HGB 9.9* 09/28/2008 0420   HCT 40.2 01/31/2013 1448   HCT 29.3* 09/28/2008 0420   PLT 182 01/31/2013 1448   PLT 178 09/28/2008 0420   MCV 101.7* 01/31/2013 1448   MCV 94.9 09/28/2008 0420   MCH 33.2 01/31/2013 1448   MCH 33.3 06/12/2010 1512   MCHC 32.6 01/31/2013 1448   MCHC 33.7 09/28/2008 0420   RDW 15.7* 01/31/2013 1448   RDW 17.0* 09/28/2008 0420   LYMPHSABS 1.5 01/31/2013 1448   LYMPHSABS 0.6* 09/21/2008 1040   MONOABS 1.2* 01/31/2013 1448   MONOABS 1.3* 09/21/2008 1040   EOSABS 0.2 01/31/2013 1448   EOSABS 0.0 09/21/2008 1040   BASOSABS 0.0 01/31/2013 1448   BASOSABS 0.1 09/21/2008 1040    Lab Results  Component Value Date   GLUCOSE 96 01/31/2013   BUN 10.4 01/31/2013   CO2 27 01/31/2013   ALT 11 01/31/2013   AST 19 01/31/2013   LDH 250* 11/25/2012   K 3.8 01/31/2013   CREATININE 0.8 01/31/2013     IMAGING STUDIES:  1. Metastatic bone survey carried out on 07/20/2009  showed osteopenia. There were some small lucent lesions within the  skull consistent with a either venous lakes or multiple myeloma  deposits. There were numerous wedge compression fractures and  degenerative changes throughout the spine. There was a wedge  compression of L3 of indeterminate age.  2. Chest x-ray,  2-view, from  07/25/2010 showed no  active cardiopulmonary disease.  3. Metastatic bone  survey from 09/05/2010 showed unchanged osseous survey with a few lytic  lesions that were seen in the skull, possibly the right femur at the  junction of the middle and distal thirds. There were compression  fractures of the thoracic and lumbar spine unchanged. The patient is  status post vertebroplasty at T11, T12, and L1. A small lytic lesion  was seen in the scapula. There was a degenerative cyst in the greater  tuberosity of the left humerus.  4. Metastatic bone survey on 07/28/2011 showed minimal progression of small  lytic lesions in the skull compatible with a history of multiple  myeloma. There was no significant change in the small lytic lesions in  left scapula. There was probably better visualization of small lytic  lesions in the proximal left femur and poor visualization of a small  lytic lesion in the distal right femur. There was stable diffuse  osteopenia and multiple vertebral compression fractures.  5. Metastatic bone survey from 12/24/2011 showed multiple compression  fractures in the spine which are stable. There were tiny lytic  lesions seen in the humeri and in the left clavicle as well as the  left scapula. These were felt to be stable. No impending  pathologic fractures.  6. Chest x-ray, 2 view, from 12/24/2011 showed no acute disease. 7. Bone density scan from 10/13/2012 showed a T-score for the lumbar spine total of -0.5.  T-score of the right femoral neck was -2.8 and the T- score for the left femoral neck was -3.2.    IMPRESSION AND PLAN:    Lindsey Dalton is 24 y old female with Kappa light chain multiple myeloma diagnosed in April 2010 with 51%  plasma cells in the marrow. As stated above, the patient most recently had been taking Revlimid 10 mg daily, 3 weeks on 1, week off.  However, she developed PE and therefore, she was taken off Revlimid and  was initially started on coumadin but was  changed to Lovenox currently 100 mg subcutaneously once daily. The patient has a history of congestive heart failure. Due to the pulmonary embolism she was taken off Revlimid and has been started on maintenance treatment with Velcade at 1.3 mg per meter squared given weekly she continues on her weekly dexamethasone at 8 mg. The patient was discussed with and also seen by Dr. Rosie Fate. The subcutaneous bruising and blood pooling on her abdomen has resolved. She does have some remaining hyperpigmentation with some underlying nodularity however these areas are nontender. We'll continue to monitor these areas on subsequent visits. The mild ecchymosis that she is experiencing on her thighs is not as significant as that occurred in her abdomen. She is to continue her Lovenox injections on her thighs rotating sites and she was instructed and she may continue to receive her subcutaneous Velcade in her abdomen. The amount of subcutaneous bruising and pooling in her abdomen is concerning. She will continue with her weekly labs and subcutaneous Velcade and dexamethasone. She'll followup with Dr. Rosie Fate in 3 weeks for a symptom management visit with a repeat CBC differential, C. met and LDH. We will continue to monitor the thigh region closely for a similar signs of ecchymosis and blood pooling. Should this occur we will consider sending a anti-factor Xa level. This study and has to be sent off to [facility].   Conni Slipper, PA-C 01/31/2013 4:53 PM

## 2013-01-31 NOTE — Patient Instructions (Addendum)
Continue weekly lab and chemotherapy Follow up with Dr. Rosie Fate in 3 weeks

## 2013-02-01 ENCOUNTER — Telehealth: Payer: Self-pay | Admitting: Internal Medicine

## 2013-02-01 ENCOUNTER — Telehealth: Payer: Self-pay | Admitting: *Deleted

## 2013-02-01 NOTE — Telephone Encounter (Signed)
Per staff message and POF I have scheduled appts.  JMW  

## 2013-02-01 NOTE — Telephone Encounter (Signed)
Talked to pt and shem is aware of appt for lab, md and chemo for September and October 2014

## 2013-02-04 ENCOUNTER — Other Ambulatory Visit: Payer: Self-pay | Admitting: Internal Medicine

## 2013-02-07 ENCOUNTER — Other Ambulatory Visit (HOSPITAL_BASED_OUTPATIENT_CLINIC_OR_DEPARTMENT_OTHER): Payer: Medicare Other | Admitting: Lab

## 2013-02-07 ENCOUNTER — Ambulatory Visit (HOSPITAL_BASED_OUTPATIENT_CLINIC_OR_DEPARTMENT_OTHER): Payer: Medicare Other

## 2013-02-07 VITALS — BP 124/81 | HR 118 | Temp 98.0°F | Resp 20

## 2013-02-07 DIAGNOSIS — C9000 Multiple myeloma not having achieved remission: Secondary | ICD-10-CM

## 2013-02-07 DIAGNOSIS — Z5112 Encounter for antineoplastic immunotherapy: Secondary | ICD-10-CM

## 2013-02-07 LAB — CBC WITH DIFFERENTIAL/PLATELET
BASO%: 0.4 % (ref 0.0–2.0)
HCT: 41.3 % (ref 34.8–46.6)
HGB: 13.7 g/dL (ref 11.6–15.9)
LYMPH%: 19.6 % (ref 14.0–49.7)
MCH: 34 pg (ref 25.1–34.0)
MCHC: 33.2 g/dL (ref 31.5–36.0)
MCV: 102.2 fL — ABNORMAL HIGH (ref 79.5–101.0)
MONO#: 1 10*3/uL — ABNORMAL HIGH (ref 0.1–0.9)
MONO%: 15.4 % — ABNORMAL HIGH (ref 0.0–14.0)
NEUT%: 61.9 % (ref 38.4–76.8)
Platelets: 195 10*3/uL (ref 145–400)
WBC: 6.7 10*3/uL (ref 3.9–10.3)

## 2013-02-07 LAB — COMPREHENSIVE METABOLIC PANEL (CC13)
ALT: 15 U/L (ref 0–55)
AST: 20 U/L (ref 5–34)
Albumin: 3.1 g/dL — ABNORMAL LOW (ref 3.5–5.0)
Alkaline Phosphatase: 54 U/L (ref 40–150)
CO2: 28 mEq/L (ref 22–29)
Creatinine: 0.8 mg/dL (ref 0.6–1.1)
Glucose: 105 mg/dl (ref 70–140)
Total Bilirubin: 0.49 mg/dL (ref 0.20–1.20)

## 2013-02-07 MED ORDER — BORTEZOMIB CHEMO SQ INJECTION 3.5 MG (2.5MG/ML)
1.3000 mg/m2 | Freq: Once | INTRAMUSCULAR | Status: AC
  Administered 2013-02-07: 2.25 mg via SUBCUTANEOUS
  Filled 2013-02-07: qty 2.25

## 2013-02-07 MED ORDER — ONDANSETRON HCL 8 MG PO TABS
ORAL_TABLET | ORAL | Status: AC
  Filled 2013-02-07: qty 1

## 2013-02-07 MED ORDER — ONDANSETRON HCL 8 MG PO TABS
8.0000 mg | ORAL_TABLET | Freq: Once | ORAL | Status: AC
  Administered 2013-02-07: 8 mg via ORAL

## 2013-02-07 NOTE — Patient Instructions (Addendum)
[facility] Discharge Instructions for Patients Receiving Chemotherapy  Today you received the following chemotherapy agents: Velcade.  To help prevent nausea and vomiting after your treatment, we encourage you to take your nausea medication as prescribed.   If you develop nausea and vomiting that is not controlled by your nausea medication, call the clinic.   BELOW ARE SYMPTOMS THAT SHOULD BE REPORTED IMMEDIATELY:  *FEVER GREATER THAN 100.5 F  *CHILLS WITH OR WITHOUT FEVER  NAUSEA AND VOMITING THAT IS NOT CONTROLLED WITH YOUR NAUSEA MEDICATION  *UNUSUAL SHORTNESS OF BREATH  *UNUSUAL BRUISING OR BLEEDING  TENDERNESS IN MOUTH AND THROAT WITH OR WITHOUT PRESENCE OF ULCERS  *URINARY PROBLEMS  *BOWEL PROBLEMS  UNUSUAL RASH Items with * indicate a potential emergency and should be followed up as soon as possible.  Feel free to call the clinic you have any questions or concerns. The clinic phone number is [phone].    

## 2013-02-08 ENCOUNTER — Telehealth: Payer: Self-pay | Admitting: Medical Oncology

## 2013-02-08 ENCOUNTER — Encounter: Payer: Self-pay | Admitting: Internal Medicine

## 2013-02-08 NOTE — Telephone Encounter (Signed)
Pt's grand daughter called stating that pt's urine is really dark. I called back and spoke with pt and she states that she had not felt well today. I could her that she was wheezing. When I asked her about her breathing she states this comes and goes and it is not new. She was wearing her oxygen. She states she has not been hungry and has forced herself to eat. She also has had stomach cramping around her navel area and has had diarrhea yesterday and today. I asked if she has taken any imodium and she has not. I explained due to the diarrhea and not drinking very well her urine could be concentrated.I encouraged her to take imodium and push fluids tonight. I asked her to go to the ED if her breathing and or pain gets worse. She voiced understanding. Dr. Rosie Fate notified of the above. I will call pt in the am to see if she is improved.

## 2013-02-08 NOTE — Progress Notes (Signed)
[employer] id# [id number] and it starts 02/07/13-forward.

## 2013-02-08 NOTE — Progress Notes (Signed)
Lindsey Dalton was approved for PAN [id number]  10/28/12-01/25/14 and [employer] [phone]. I will send to billing and medical records.

## 2013-02-14 ENCOUNTER — Ambulatory Visit (HOSPITAL_BASED_OUTPATIENT_CLINIC_OR_DEPARTMENT_OTHER): Payer: Medicare Other

## 2013-02-14 ENCOUNTER — Other Ambulatory Visit (HOSPITAL_BASED_OUTPATIENT_CLINIC_OR_DEPARTMENT_OTHER): Payer: Medicare Other

## 2013-02-14 ENCOUNTER — Other Ambulatory Visit: Payer: Self-pay | Admitting: Internal Medicine

## 2013-02-14 VITALS — BP 150/92 | HR 99 | Temp 97.7°F

## 2013-02-14 DIAGNOSIS — C9000 Multiple myeloma not having achieved remission: Secondary | ICD-10-CM

## 2013-02-14 DIAGNOSIS — Z5112 Encounter for antineoplastic immunotherapy: Secondary | ICD-10-CM

## 2013-02-14 LAB — COMPREHENSIVE METABOLIC PANEL (CC13)
AST: 17 U/L (ref 5–34)
Albumin: 3 g/dL — ABNORMAL LOW (ref 3.5–5.0)
BUN: 10.5 mg/dL (ref 7.0–26.0)
CO2: 31 mEq/L — ABNORMAL HIGH (ref 22–29)
Calcium: 9.7 mg/dL (ref 8.4–10.4)
Chloride: 108 mEq/L (ref 98–109)
Creatinine: 0.8 mg/dL (ref 0.6–1.1)
Glucose: 104 mg/dl (ref 70–140)
Total Bilirubin: 0.26 mg/dL (ref 0.20–1.20)

## 2013-02-14 LAB — CBC WITH DIFFERENTIAL/PLATELET
BASO%: 0.7 % (ref 0.0–2.0)
Basophils Absolute: 0 10*3/uL (ref 0.0–0.1)
EOS%: 2.8 % (ref 0.0–7.0)
Eosinophils Absolute: 0.2 10*3/uL (ref 0.0–0.5)
HCT: 39.8 % (ref 34.8–46.6)
HGB: 13.1 g/dL (ref 11.6–15.9)
LYMPH%: 21.9 % (ref 14.0–49.7)
MCHC: 33 g/dL (ref 31.5–36.0)
MONO#: 1.1 10*3/uL — ABNORMAL HIGH (ref 0.1–0.9)
NEUT%: 57.6 % (ref 38.4–76.8)
Platelets: 181 10*3/uL (ref 145–400)
WBC: 6.5 10*3/uL (ref 3.9–10.3)
lymph#: 1.4 10*3/uL (ref 0.9–3.3)

## 2013-02-14 MED ORDER — ONDANSETRON HCL 8 MG PO TABS
8.0000 mg | ORAL_TABLET | Freq: Once | ORAL | Status: AC
  Administered 2013-02-14: 8 mg via ORAL

## 2013-02-14 MED ORDER — BORTEZOMIB CHEMO SQ INJECTION 3.5 MG (2.5MG/ML)
1.3000 mg/m2 | Freq: Once | INTRAMUSCULAR | Status: AC
  Administered 2013-02-14: 2.25 mg via SUBCUTANEOUS
  Filled 2013-02-14: qty 2.25

## 2013-02-14 MED ORDER — ONDANSETRON HCL 8 MG PO TABS
ORAL_TABLET | ORAL | Status: AC
  Filled 2013-02-14: qty 1

## 2013-02-14 NOTE — Patient Instructions (Addendum)
[facility] Discharge Instructions for Patients Receiving Chemotherapy  Today you received the following chemotherapy agents: Velcade.  To help prevent nausea and vomiting after your treatment, we encourage you to take your nausea medication as prescribed.   If you develop nausea and vomiting that is not controlled by your nausea medication, call the clinic.   BELOW ARE SYMPTOMS THAT SHOULD BE REPORTED IMMEDIATELY:  *FEVER GREATER THAN 100.5 F  *CHILLS WITH OR WITHOUT FEVER  NAUSEA AND VOMITING THAT IS NOT CONTROLLED WITH YOUR NAUSEA MEDICATION  *UNUSUAL SHORTNESS OF BREATH  *UNUSUAL BRUISING OR BLEEDING  TENDERNESS IN MOUTH AND THROAT WITH OR WITHOUT PRESENCE OF ULCERS  *URINARY PROBLEMS  *BOWEL PROBLEMS  UNUSUAL RASH Items with * indicate a potential emergency and should be followed up as soon as possible.  Feel free to call the clinic you have any questions or concerns. The clinic phone number is [phone].    

## 2013-02-16 ENCOUNTER — Other Ambulatory Visit: Payer: Self-pay | Admitting: Hematology and Oncology

## 2013-02-16 ENCOUNTER — Encounter: Payer: Self-pay | Admitting: Medical Oncology

## 2013-02-16 ENCOUNTER — Other Ambulatory Visit: Payer: Self-pay | Admitting: Certified Registered Nurse Anesthetist

## 2013-02-16 MED ORDER — ENOXAPARIN SODIUM 100 MG/ML ~~LOC~~ SOLN: 100.0000 mg | Freq: Every day | SUBCUTANEOUS | Status: DC

## 2013-02-22 ENCOUNTER — Other Ambulatory Visit: Payer: Self-pay | Admitting: Internal Medicine

## 2013-02-22 ENCOUNTER — Other Ambulatory Visit (HOSPITAL_BASED_OUTPATIENT_CLINIC_OR_DEPARTMENT_OTHER): Payer: Medicare Other | Admitting: Lab

## 2013-02-22 ENCOUNTER — Ambulatory Visit (HOSPITAL_BASED_OUTPATIENT_CLINIC_OR_DEPARTMENT_OTHER): Payer: Medicare Other | Admitting: Internal Medicine

## 2013-02-22 ENCOUNTER — Ambulatory Visit (HOSPITAL_BASED_OUTPATIENT_CLINIC_OR_DEPARTMENT_OTHER): Payer: Medicare Other

## 2013-02-22 VITALS — BP 148/86 | HR 120 | Temp 96.7°F | Resp 18 | Ht 64.0 in | Wt 146.4 lb

## 2013-02-22 DIAGNOSIS — C9000 Multiple myeloma not having achieved remission: Secondary | ICD-10-CM

## 2013-02-22 DIAGNOSIS — E876 Hypokalemia: Secondary | ICD-10-CM

## 2013-02-22 DIAGNOSIS — G8929 Other chronic pain: Secondary | ICD-10-CM

## 2013-02-22 DIAGNOSIS — I2510 Atherosclerotic heart disease of native coronary artery without angina pectoris: Secondary | ICD-10-CM

## 2013-02-22 DIAGNOSIS — Z5112 Encounter for antineoplastic immunotherapy: Secondary | ICD-10-CM

## 2013-02-22 DIAGNOSIS — I2699 Other pulmonary embolism without acute cor pulmonale: Secondary | ICD-10-CM

## 2013-02-22 DIAGNOSIS — M549 Dorsalgia, unspecified: Secondary | ICD-10-CM

## 2013-02-22 DIAGNOSIS — E559 Vitamin D deficiency, unspecified: Secondary | ICD-10-CM

## 2013-02-22 DIAGNOSIS — R197 Diarrhea, unspecified: Secondary | ICD-10-CM

## 2013-02-22 DIAGNOSIS — I509 Heart failure, unspecified: Secondary | ICD-10-CM

## 2013-02-22 LAB — COMPREHENSIVE METABOLIC PANEL (CC13)
AST: 18 U/L (ref 5–34)
Albumin: 3.2 g/dL — ABNORMAL LOW (ref 3.5–5.0)
BUN: 12.3 mg/dL (ref 7.0–26.0)
CO2: 26 mEq/L (ref 22–29)
Calcium: 9.9 mg/dL (ref 8.4–10.4)
Chloride: 112 mEq/L — ABNORMAL HIGH (ref 98–109)
Creatinine: 0.8 mg/dL (ref 0.6–1.1)
Potassium: 4 mEq/L (ref 3.5–5.1)
Sodium: 144 mEq/L (ref 136–145)
Total Protein: 5.8 g/dL — ABNORMAL LOW (ref 6.4–8.3)

## 2013-02-22 LAB — CBC WITH DIFFERENTIAL/PLATELET
Basophils Absolute: 0 10*3/uL (ref 0.0–0.1)
EOS%: 3.1 % (ref 0.0–7.0)
Eosinophils Absolute: 0.2 10*3/uL (ref 0.0–0.5)
HGB: 13.7 g/dL (ref 11.6–15.9)
MCH: 33.4 pg (ref 25.1–34.0)
MONO#: 1.3 10*3/uL — ABNORMAL HIGH (ref 0.1–0.9)
NEUT#: 4.9 10*3/uL (ref 1.5–6.5)
NEUT%: 63.8 % (ref 38.4–76.8)
RDW: 15.2 % — ABNORMAL HIGH (ref 11.2–14.5)
lymph#: 1.3 10*3/uL (ref 0.9–3.3)

## 2013-02-22 MED ORDER — ONDANSETRON HCL 8 MG PO TABS
8.0000 mg | ORAL_TABLET | Freq: Once | ORAL | Status: AC
  Administered 2013-02-22: 8 mg via ORAL

## 2013-02-22 MED ORDER — BORTEZOMIB CHEMO SQ INJECTION 3.5 MG (2.5MG/ML)
1.3000 mg/m2 | Freq: Once | INTRAMUSCULAR | Status: AC
  Administered 2013-02-22: 2.25 mg via SUBCUTANEOUS
  Filled 2013-02-22: qty 2.25

## 2013-02-22 MED ORDER — ONDANSETRON HCL 8 MG PO TABS
ORAL_TABLET | ORAL | Status: AC
  Filled 2013-02-22: qty 1

## 2013-02-22 NOTE — Patient Instructions (Signed)
[facility] Discharge Instructions for Patients Receiving Chemotherapy  Today you received the following chemotherapy agents: Velcade.  To help prevent nausea and vomiting after your treatment, we encourage you to take your nausea medication, Zofran. Take one every 8 hours as needed.   If you develop nausea and vomiting that is not controlled by your nausea medication, call the clinic.   BELOW ARE SYMPTOMS THAT SHOULD BE REPORTED IMMEDIATELY:  *FEVER GREATER THAN 100.5 F  *CHILLS WITH OR WITHOUT FEVER  NAUSEA AND VOMITING THAT IS NOT CONTROLLED WITH YOUR NAUSEA MEDICATION  *UNUSUAL SHORTNESS OF BREATH  *UNUSUAL BRUISING OR BLEEDING  TENDERNESS IN MOUTH AND THROAT WITH OR WITHOUT PRESENCE OF ULCERS  *URINARY PROBLEMS  *BOWEL PROBLEMS  UNUSUAL RASH Items with * indicate a potential emergency and should be followed up as soon as possible.  Feel free to call the clinic should you have any questions or concerns. The clinic phone number is [phone].

## 2013-02-22 NOTE — Patient Instructions (Signed)
Multiple Myeloma Multiple myeloma is the most common cancer of bone. It is caused by the uncontrolled multiplication of a type of white blood cell in the marrow. This white blood cell is called a plasma cell. This means the bone marrow is overworking producing plasma cells. Soon these overproduced cells begin to take up room in the marrow that is needed by other cells. This means that there are soon not enough red or white blood cells or platelets. Not enough red cells mean that the person is anemic. There are not enough red blood cells to carry oxygen around the body. There are not enough white blood cells to fight disease. This causes the person with multiple myeloma to not feel well. There is also bone pain through much of the body. SYMPTOMS  Anemia causes fatigue (tiredness) and weakness.  Back pain is common. This is from fractures (break in bones) caused by damage to the bones of the back.  Lack of white blood cells makes infection more likely.  Bleeding is a common problem from lack of the cells (platelets). Platelets help blood clots form. This may show up as bleeding from any place. Commonly this shows up as bleeding from the nose or gums.  Fractures (bone breaks) are more common anywhere. The back and ribs are the most commonly fractured areas. DIAGNOSIS  This tumor is often suggested by blood tests. Often doing a bone marrow sample makes the diagnosis (learning what is wrong). This is a test performed by taking a small sample of bone with a small needle. This bone often comes from the sternum (breast bone). This sample is sent to a pathologist (a specialist in looking at tissue under a microscope). After looking at the sample under the microscope, the pathologist is able to make a diagnosis of the problem. X-rays may also show boney changes. TREATMENT   Occasionally, anti-cancer medications may be used with multiple myeloma. Your caregiver can discuss this with you.  Medications can  also be given to help with the bone pain.  There is no cure for multiple myeloma. Lifestyle changes can add years of quality living. HOME CARE INSTRUCTIONS  Often there is no specific treatment for multiple myeloma. Most of the treatment consists of adjustments in dietary and living activities. Some of these changes include:  Your dietitian or caregiver helping you with your dietary questions.  Taking iron and vitamins as prescribed by your caregiver.  Eating a well balanced diet.  Staying active, but follow restrictions suggested by your caregiver. Avoiding heavy lifting (more than 10 pounds) and activities that cause increased pain.  Drinking plenty of water.  Using back braces and a cane may help with some of the boney pain. SEEK IMMEDIATE MEDICAL CARE IF:  You develop severe, uncontrolled boney pain.  You or your family notices confusion, problems with decision-making or inability to stay awake.  You notice increased urination or constipation.  You notice problems holding your water or stool.  You have numbness or loss of control of your extremities (arms/hands or legs/feet). Document Released: 01/21/2001 Document Revised: 07/21/2011 Document Reviewed: 04/23/2008 [facility] Patient Information 2014 [place], [place]. Dexamethasone tablets What is this medicine? DEXAMETHASONE (dex a METH a sone) is a corticosteroid. It is commonly used to treat inflammation of the skin, joints, lungs, and other organs. Common conditions treated include asthma, allergies, and arthritis. It is also used for other conditions, such as blood disorders and diseases of the adrenal glands. This medicine may be used for other purposes;  ask your health care provider or pharmacist if you have questions. What should I tell my health care provider before I take this medicine? They need to know if you have any of these conditions: -Cushing's syndrome -diabetes -glaucoma -heart problems or disease -high blood  pressure -infection like herpes, measles, tuberculosis, or chickenpox -kidney disease -liver disease -mental problems -myasthenia gravis -osteoporosis -previous heart attack -seizures -stomach, ulcer or intestine disease including colitis and diverticulitis -thyroid problem -an unusual or allergic reaction to dexamethasone, corticosteroids, other medicines, lactose, foods, dyes, or preservatives -pregnant or trying to get pregnant -breast-feeding How should I use this medicine? Take this medicine by mouth with a drink of water. Follow the directions on the prescription label. Take it with food or milk to avoid stomach upset. If you are taking this medicine once a day, take it in the morning. Do not take more medicine than you are told to take. Do not suddenly stop taking your medicine because you may develop a severe reaction. Your doctor will tell you how much medicine to take. If your doctor wants you to stop the medicine, the dose may be slowly lowered over time to avoid any side effects. Talk to your pediatrician regarding the use of this medicine in children. Special care may be needed. Patients over 99 years old may have a stronger reaction and need a smaller dose. Overdosage: If you think you have taken too much of this medicine contact a poison control center or emergency room at once. NOTE: This medicine is only for you. Do not share this medicine with others. What if I miss a dose? If you miss a dose, take it as soon as you can. If it is almost time for your next dose, talk to your doctor or health care professional. You may need to miss a dose or take an extra dose. Do not take double or extra doses without advice. What may interact with this medicine? Do not take this medicine with any of the following medications: -mifepristone, RU-486 -vaccines This medicine may also interact with the following medications: -amphotericin B -antibiotics like clarithromycin, erythromycin, and  troleandomycin -aspirin and aspirin-like drugs -barbiturates like phenobarbital -carbamazepine -cholestyramine -cholinesterase inhibitors like donepezil, galantamine, rivastigmine, and tacrine -cyclosporine -digoxin -diuretics -ephedrine -female hormones, like estrogens or progestins and birth control pills -indinavir -isoniazid -ketoconazole -medicines for diabetes -medicines that improve muscle tone or strength for conditions like myasthenia gravis -NSAIDs, medicines for pain and inflammation, like ibuprofen or naproxen -phenytoin -rifampin -thalidomide -warfarin This list may not describe all possible interactions. Give your health care provider a list of all the medicines, herbs, non-prescription drugs, or dietary supplements you use. Also tell them if you smoke, drink alcohol, or use illegal drugs. Some items may interact with your medicine. What should I watch for while using this medicine? Visit your doctor or health care professional for regular checks on your progress. If you are taking this medicine over a prolonged period, carry an identification card with your name and address, the type and dose of your medicine, and your doctor's name and address. This medicine may increase your risk of getting an infection. Stay away from people who are sick. Tell your doctor or health care professional if you are around anyone with measles or chickenpox. If you are going to have surgery, tell your doctor or health care professional that you have taken this medicine within the last twelve months. Ask your doctor or health care professional about your diet. You may need  to lower the amount of salt you eat. The medicine can increase your blood sugar. If you are a diabetic check with your doctor if you need help adjusting the dose of your diabetic medicine. What side effects may I notice from receiving this medicine? Side effects that you should report to your doctor or health care  professional as soon as possible: -allergic reactions like skin rash, itching or hives, swelling of the face, lips, or tongue -changes in vision -fever, sore throat, sneezing, cough, or other signs of infection, wounds that will not heal -increased thirst -mental depression, mood swings, mistaken feelings of self importance or of being mistreated -pain in hips, back, ribs, arms, shoulders, or legs -redness, blistering, peeling or loosening of the skin, including inside the mouth -trouble passing urine or change in the amount of urine -swelling of feet or lower legs -unusual bleeding or bruising Side effects that usually do not require medical attention (report to your doctor or health care professional if they continue or are bothersome): -headache -nausea, vomiting -skin problems, acne, thin and shiny skin -weight gain This list may not describe all possible side effects. Call your doctor for medical advice about side effects. You may report side effects to FDA at 1-800-FDA-1088. Where should I keep my medicine? Keep out of the reach of children. Store at room temperature between 20 and 25 degrees C (68 and 77 degrees F). Protect from light. Throw away any unused medicine after the expiration date. NOTE: This sheet is a summary. It may not cover all possible information. If you have questions about this medicine, talk to your doctor, pharmacist, or health care provider.  2013, [employer]. (08/19/2007 2:02:13 PM) Bortezomib injection What is this medicine? BORTEZOMIB (bor TEZ oh mib) is a chemotherapy drug. It slows the growth of cancer cells. This medicine is used to treat multiple myeloma, lymphoma, and other cancers. This medicine may be used for other purposes; ask your health care provider or pharmacist if you have questions. What should I tell my health care provider before I take this medicine? They need to know if you have any of these conditions: -heart  disease -irregular heartbeat -liver disease -low blood counts, like low white blood cells, platelets, or hemoglobin -peripheral neuropathy -taking medicine for blood pressure -an unusual or allergic reaction to bortezomib, mannitol, boron, other medicines, foods, dyes, or preservatives -pregnant or trying to get pregnant -breast-feeding How should I use this medicine? This medicine is for injection into a vein or for injection under the skin. It is given by a health care professional in a hospital or clinic setting. Talk to your pediatrician regarding the use of this medicine in children. Special care may be needed. Overdosage: If you think you have taken too much of this medicine contact a poison control center or emergency room at once. NOTE: This medicine is only for you. Do not share this medicine with others. What if I miss a dose? It is important not to miss your dose. Call your doctor or health care professional if you are unable to keep an appointment. What may interact with this medicine? -medicines for diabetes -medicines to increase blood counts like filgrastim, pegfilgrastim, sargramostim -zalcitabine Talk to your doctor or health care professional before taking any of these medicines: -acetaminophen -aspirin -ibuprofen -ketoprofen -naproxen This list may not describe all possible interactions. Give your health care provider a list of all the medicines, herbs, non-prescription drugs, or dietary supplements you use. Also tell them if you  smoke, drink alcohol, or use illegal drugs. Some items may interact with your medicine. What should I watch for while using this medicine? Visit your doctor for checks on your progress. This drug may make you feel generally unwell. This is not uncommon, as chemotherapy can affect healthy cells as well as cancer cells. Report any side effects. Continue your course of treatment even though you feel ill unless your doctor tells you to stop. You  may get drowsy or dizzy. Do not drive, use machinery, or do anything that needs mental alertness until you know how this medicine affects you. Do not stand or sit up quickly, especially if you are an older patient. This reduces the risk of dizzy or fainting spells. In some cases, you may be given additional medicines to help with side effects. Follow all directions for their use. Call your doctor or health care professional for advice if you get a fever, chills or sore throat, or other symptoms of a cold or flu. Do not treat yourself. This drug decreases your body's ability to fight infections. Try to avoid being around people who are sick. This medicine may increase your risk to bruise or bleed. Call your doctor or health care professional if you notice any unusual bleeding. Be careful brushing and flossing your teeth or using a toothpick because you may get an infection or bleed more easily. If you have any dental work done, tell your dentist you are receiving this medicine. Avoid taking products that contain aspirin, acetaminophen, ibuprofen, naproxen, or ketoprofen unless instructed by your doctor. These medicines may hide a fever. Do not become pregnant while taking this medicine. Women should inform their doctor if they wish to become pregnant or think they might be pregnant. There is a potential for serious side effects to an unborn child. Talk to your health care professional or pharmacist for more information. Do not breast-feed an infant while taking this medicine. You may have vomiting or diarrhea while taking this medicine. Drink water or other fluids as directed. What side effects may I notice from receiving this medicine? Side effects that you should report to your doctor or health care professional as soon as possible: -allergic reactions like skin rash, itching or hives, swelling of the face, lips, or tongue -breathing problems -changes in hearing -changes in vision -fast, irregular  heartbeat -feeling faint or lightheaded, falls -pain, tingling, numbness in the hands or feet -seizures -swelling of the ankles, feet, hands -unusual bleeding or bruising -unusually weak or tired -vomiting Side effects that usually do not require medical attention (report to your doctor or health care professional if they continue or are bothersome): -changes in emotions or moods -constipation -diarrhea -loss of appetite -headache -irritation at site where injected -nausea This list may not describe all possible side effects. Call your doctor for medical advice about side effects. You may report side effects to FDA at 1-800-FDA-1088. Where should I keep my medicine? This drug is given in a hospital or clinic and will not be stored at home. NOTE: This sheet is a summary. It may not cover all possible information. If you have questions about this medicine, talk to your doctor, pharmacist, or health care provider.  2013, [employer]. (06/05/2010 11:42:36 AM) Enoxaparin, Home Use Enoxaparin (Lovenox) injection is a medication used to prevent clots from developing in your veins. Medications such as enoxaparin are called blood thinners or anticoagulants. If blood clots are untreated they could travel to your lungs. This is called a pulmonary  embolus. A blood clot in your lungs can be fatal. Caregivers often use anticoagulants such as enoxaparin to prevent clots following surgery. It is also used along with aspirin when the heart is not getting enough blood. Usually another anticoagulant called warfarin (Coumadin) is started in 2 to 3 days after the enoxaparin is started. The enoxaparin is usually continued until the other anticoagulants have begun to work. Your caregiver will judge this length of time by measuring the length of time that it takes your blood to clot. RISKS AND COMPLICATIONS  If you have received recent epidural anesthesia, spinal anesthesia, or a spinal tap while  receiving anticoagulants, you are at risk for developing a blood clot in or around the spine. This condition could result in long-term or permanent paralysis.  Because anticoagulants thin your blood, severe bleeding may occur from any tissue or organ. Symptoms of the blood being too thin may include:  Bleeding from the nose or gums that does not stop quickly.  Unusual bruising or bruising easily.  Swelling or pain at an injection site.  A cut that does not stop bleeding within 10 minutes.  Continual nausea for more than 1 day or vomiting blood.  Coughing up blood.  Blood in the urine which may appear as pink, red, or brown urine.  Blood in bowel movements which may appear as red, dark or black stools.  Sudden weakness or numbness of the face, arm, or leg, especially on one side of the body.  Sudden confusion.  Trouble speaking (aphasia) or understanding.  Sudden trouble seeing in one or both eyes.  Sudden trouble walking.  Dizziness.  Loss of balance or coordination.  Severe pain, such as a headache, joint pain, or back pain.  Fever.  Bruising around the injection sites may be expected.  Platelet drops, known as "thrombocytopenia," can occur with enoxaparin use. A condition called "heparin-induced thrombocytopenia" has been seen. If you have had this condition, you should tell your caregiver. Your caregiver may direct you to have blood tests to monitor this condition.  Do not use if you have allergies to the medication, heparin, or pork products.  Other side effects may include mild local reactions or irritation at the site of injection, pain, bruising, and redness of skin. HOME CARE INSTRUCTIONS You will be instructed by your caregiver how to give enoxaparin injections. 1. Before giving your medication you should make sure the injection is a clear and colorless or pale yellow solution. If your medication becomes discolored or has particles in the bottle, do not use and  notify your caregiver. 2. When using the 30 and 40 mg pre-filled syringes, do not expel the air bubble from the syringe before the injection. This makes sure you use all the medication in the syringe. 3. The injections will be given subcutaneously. This means it is given into the fat over the belly (abdomen). It is given deep beneath the skin but not into the muscle. The shots should be injected around the abdominal wall. Change the sites of injection each time. The whole length of the needle should be introduced into a skin fold held between the thumb and forefinger; the skin fold should be held throughout the injection. Do not rub the injection site after completion of the injection. This increases bruising. Enoxaparin injection pre-filled syringes and graduated pre-filled syringes are available with a system that shields the needle after injection. 4. Inject by pushing the plunger to the bottom of the syringe. 5. Remove the syringe from  the injection site keeping your finger on the plunger rod. Be careful not to stick yourself or others. 6. After injection and the syringe is empty, set off the safety system by firmly pushing the plunger rod. The protective sleeve will automatically cover the needle and you can hear a click. The click means your needle is safely covered. Do not try replacing the needle shield. 7. Get rid of the syringe in the nearest sharps container. 8. Keep your medication safely stored at room temperatures.  Due to the complications of anticoagulants, it is very important that you take your anticoagulant as directed by your caregiver. Anticoagulants need to be taken exactly as instructed. Be sure you understand all your anticoagulant instructions.  Changes in medicines, supplements, diet, and illness can affect your anticoagulation therapy. Be sure to inform your caregivers of any of these changes.  While on anticoagulants, you will need to have blood tests done routinely as  directed by your caregivers.  Be careful not to cut yourself when using sharp objects.  Avoid heavy or variable alcohol use. Consume alcohol only in very limited quantities. General alcohol intake guidelines are 1 drink for nonpregnant women and 2 drinks for men per day. (1 drink = 5 ounces of wine, 12 ounces of beer, or 1 ounces of liquor). A sudden increase in alcohol use can increase your risk of bleeding. Chronic alcohol use can cause warfarin to be less effective.  Limit physical activities or sports that could result in a fall or cause injury.  It is extremely important that you tell all of your caregivers and dentist that you are taking an anticoagulant, especially if you are injured or plan to have any type of procedure or operation.  Follow up with your laboratory test and caregiver appointments as directed. It is very important to keep your appointments. Not keeping appointments could result in a chronic or permanent injury, pain, or disability. SEEK MEDICAL CARE IF:  You develop any rashes.  You have any worsening of the condition for which you are receiving anticoagulation therapy. SEEK IMMEDIATE MEDICAL CARE IF:  Bleeding from the nose or gums does not stop quickly.  You have unusual bruising or are bruising easily.  Swelling or pain occurs at an injection site.  A cut does not stop bleeding within 10 minutes.  You have continual nausea for more than 1 day or are vomiting blood.  You are coughing up blood.  You have blood in the urine.  You have dark or black stools.  You have sudden weakness or numbness of the face, arm, or leg, especially on one side of the body.  You have sudden confusion.  You have trouble speaking (aphasia) or understanding.  You have sudden trouble seeing in one or both eyes.  You have sudden trouble walking.  You have dizziness.  You have a loss of balance or coordination.  You have severe pain, such as a headache, joint pain, or  back pain.  You have a serious fall or head injury, even if you are not bleeding.  You have an oral temperature above 102 F (38.9 C), not controlled by medicine. ANY OF THESE SYMPTOMS MAY REPRESENT A SERIOUS PROBLEM THAT IS AN EMERGENCY. Do not wait to see if the symptoms will go away. Get medical help right away. Call your local emergency services (911 in [place] DO NOT drive yourself to the hospital. MAKE SURE YOU:  Understand these instructions.  Will watch your condition.  Will get help  right away if you are not doing well or get worse. Document Released: 02/28/2004 Document Revised: 07/21/2011 Document Reviewed: 04/28/2005 [facility] Patient Information 2014 [place], [place]. Enoxaparin injection What is this medicine? ENOXAPARIN (ee nox a PA rin) is used after knee, hip, or abdominal surgeries to prevent blood clotting. It is also used to treat existing blood clots in the lungs or in the veins. This medicine may be used for other purposes; ask your health care provider or pharmacist if you have questions. What should I tell my health care provider before I take this medicine? They need to know if you have any of these conditions: -bleeding disorders, hemorrhage, or hemophilia -infection of the heart or heart valves -kidney or liver disease -previous stroke -prosthetic heart valve -recent surgery or delivery of a baby -ulcer in the stomach or intestine, diverticulitis, or other bowel disease -an unusual or allergic reaction to enoxaparin, heparin, pork or pork products, other medicines, foods, dyes, or preservatives -pregnant or trying to get pregnant -breast-feeding How should I use this medicine? This medicine is for injection under the skin. It is usually given by a health-care professional. You or a family member may be trained on how to give the injections. If you are to give yourself injections, make sure you understand how to use the syringe, measure the dose if necessary,  and give the injection. To avoid bruising, do not rub the site where this medicine has been injected. Do not take your medicine more often than directed. Do not stop taking except on the advice of your doctor or health care professional. Make sure you receive a puncture-resistant container to dispose of the needles and syringes once you have finished with them. Do not reuse these items. Return the container to your doctor or health care professional for proper disposal. Talk to your pediatrician regarding the use of this medicine in children. Special care may be needed. Overdosage: If you think you have taken too much of this medicine contact a poison control center or emergency room at once. NOTE: This medicine is only for you. Do not share this medicine with others. What if I miss a dose? If you miss a dose, take it as soon as you can. If it is almost time for your next dose, take only that dose. Do not take double or extra doses. What may interact with this medicine? Do not take this medicine with any of the following medications: -aspirin and aspirin-like medicines -heparin -mifepristone -warfarin This medicine may also interact with the following medications: -cilostazol -clopidogrel -dipyridamole -NSAIDs, medicines for pain and inflammation, like ibuprofen or naproxen -sulfinpyrazone -ticlopidine This list may not describe all possible interactions. Give your health care provider a list of all the medicines, herbs, non-prescription drugs, or dietary supplements you use. Also tell them if you smoke, drink alcohol, or use illegal drugs. Some items may interact with your medicine. What should I watch for while using this medicine? Visit your doctor or health care professional for regular checks on your progress. Your condition will be monitored carefully while you are receiving this medicine. Contact your doctor or health care professional and seek emergency treatment if you develop  increased difficulty in breathing, chest pain, dizziness, shortness of breath, swelling in the legs or arms, abdominal pain, decreased vision, pain when walking, or pain and warmth of the arms or legs. These can be signs that your condition has gotten worse. Monitor your skin closely for easy bruising or red spots, which can be  signs of bleeding. If you notice easy bruising or minor bleeding from the nose, gums/teeth, in your urine, or stool, contact your doctor or health care professional right away. The dose of your medicine may need to be changed. If you are going to have surgery, tell your doctor or health care professional that you are taking this medicine. Try to avoid injury while you are using this medicine. Be careful when brushing or flossing your teeth, shaving, cutting your fingernails or toenails, or when using sharp objects. Report any injuries to your doctor or health care professional. What side effects may I notice from receiving this medicine? Side effects that you should report to your doctor or health care professional as soon as possible: -allergic reactions like skin rash, itching or hives, swelling of the face, lips, or tongue -black, tarry stools -breathing problems -dark urine -feeling faint or lightheaded, falls -fever -heavy menstrual bleeding -unusual bruising or bleeding Side effects that usually do not require medical attention (report to your doctor or health care professional if they continue or are bothersome): -pain or irritation at the injection site This list may not describe all possible side effects. Call your doctor for medical advice about side effects. You may report side effects to FDA at 1-800-FDA-1088. Where should I keep my medicine? Keep out of the reach of children. Store at room temperature between 15 and 30 degrees C (59 and 86 degrees F). Do not freeze. If your injections have been specially prepared, you may need to store them in the refrigerator.  Ask your pharmacist. Throw away any unused medicine after the expiration date. NOTE: This sheet is a summary. It may not cover all possible information. If you have questions about this medicine, talk to your doctor, pharmacist, or health care provider.  2013, [employer]. (07/09/2007 4:47:37 PM)

## 2013-02-23 LAB — KAPPA/LAMBDA LIGHT CHAINS
Kappa free light chain: 40.8 mg/dL — ABNORMAL HIGH (ref 0.33–1.94)
Lambda Free Lght Chn: 0.82 mg/dL (ref 0.57–2.63)

## 2013-02-23 NOTE — Progress Notes (Signed)
[facility] OFFICE PROGRESS NOTE  Lindsey Penna, MD [address] [place] [address]  DIAGNOSIS: Multiple myeloma - Plan: CBC with Differential, Comprehensive metabolic panel, CBC with Differential, CBC with Differential, Comprehensive metabolic panel, Comprehensive metabolic panel, DG Bone Survey Met, IFE, Urine (with Tot Prot), Kappa/lambda light chains, CANCELED: SPEP & IFE with QIG  Vitamin D deficiency  Acute pulmonary embolism - Plan: CBC with Differential, Comprehensive metabolic panel, CBC with Differential, CBC with Differential, Comprehensive metabolic panel, Comprehensive metabolic panel  Chief Complaint  Patient presents with  . Multiple Myeloma   PAST THERAPY: -Zometa 3 mg about every 3 months from 09/15/2008 through 11/01/2010. 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  Revlimid 10 mg daily 3 weeks out of every 4 weeks. Revlimid was started in June 2010 but discontinued due to development of PE.   CURRENT THERAPY: Velcade 1.3 mg per meter squared  subcutaneously weekly, decadron 8 mg PO weekly (Vd)  INTERVAL HISTORY: Lindsey Dalton 79 y.o. female with a history of multiple co-morbidities as noted below, including kappa light chain multiple myeloma is here for follow-up.  She was last seen by Garlon Hatchet on 01/31/2013.   She states that she feels "so so".  Her appetite is still slightly decreased with a 3 lb weigh tloss.  She had mild diarrhea last night.  Otherwise, she denies bone pain or fevers/chills or acute shortness of breath.    On 01/11/2013 and she was asked to discontinue giving her self her Lovenox injections in the abdomen and to use her anterior lateral thigh region for these injections, allowing the abdomen to heal. She states that the bruising is significantly better on her abdomen although she is experiencing some bruising on her thighs it is not to the extent  that she was having on her abdomen. Her complaints of fatigue have remained stable with no increasing fatigue. She is now taking the acyclovir as prescribed.  She denied nausea, vomiting, diarrhea or constipation.   MEDICAL HISTORY: Past Medical History  Diagnosis Date  . CHF (congestive heart failure)   . Diverticulosis   . Lower GI bleed   . Hiatal hernia   . Hypercalcemia   . Osteoporosis with fracture   . CAD (coronary artery disease)   . HTN (hypertension)   . DJD (degenerative joint disease)   . Dyslipidemia   . Multiple myeloma 04/25/2011  . Vitamin D deficiency 04/25/2011  . Anemia     INTERIM HISTORY: has Multiple myeloma; Vitamin d deficiency; Diarrhea; Loss of weight; Dyspnea; and Acute pulmonary embolism on her problem list.    ALLERGIES:  has No Known Allergies.  MEDICATIONS: has a current medication list which includes the following prescription(s): acyclovir, allopurinol, atenolol, attapulgite, calcium citrate-vitamin d, cholecalciferol, citalopram, dexamethasone, diphenoxylate-atropine, enoxaparin, ferrous gluconate, furosemide, guaifenesin, hydrocodone-acetaminophen, loperamide hcl, nystatin, ondansetron, potassium chloride, promethazine, promethazine, ranitidine, simvastatin, and temazepam.  SURGICAL HISTORY:  Past Surgical History  Procedure Laterality Date  . Fixation kyphoplasty      T11, T12, L1  . Total abdominal hysterectomy    . Esophagogastroduodenoscopy  03/01/2012    Procedure: ESOPHAGOGASTRODUODENOSCOPY (EGD);  Surgeon: Hart Carwin, MD;  Location: [facility];  Service: Endoscopy;  Laterality: N/A;  . Flexible sigmoidoscopy  03/01/2012    Procedure: FLEXIBLE SIGMOIDOSCOPY;  Surgeon: Hart Carwin, MD;  Location: [facility];  Service: Endoscopy;  Laterality: N/A;   PROBLEM LIST:  1. Kappa light chain multiple myeloma diagnosed  in April 2010 with 51% plasma cells in the marrow. Kappa light chains evident on serum and urine immunofixation  electrophoresis and 8.3 gm of protein in the urine. FISH studies showed 13q minus and 11:14 translocation indicative of a poor prognosis. The patient initially was treated with Velcade and Decadron from mid-May 2010 through mid-May 2011 with an excellent response. She also received Zometa from 09/15/08 to 11/01/10. The patient also has been receiving Revlimid since June 2010 and has continued on Revlimid and Decadron since that time in a program as described above. She has had an excellent response to treatment. Last metastatic bone survey was on 12/24/2011 and showed no significant changes when compared with the prior study of 07/20/2011. There were a few lytic lesions, but no evidence for progression. The patient's last 24-hour urine collection was on 12/24/2011 and revealed 58 mg of protein. The UIFE was negative for monoclonal light chains. We  have not repeated the bone marrow since the initial bone marrow of April 2010 which showed 51% plasma cells.  2. History of congestive heart failure with coronary artery disease  status post cardiac catheterizations in 2009.  3. History of diverticulosis and lower GI bleed in May 2010.  4. History of multiple compression fractures with balloon kyphoplasty  involving T11, T12, L1, on 08/17/2008.  5. Hypertension.  6. Dyslipidemia.  7. Hiatal hernia.  8. Severe degenerative joint disease.  9. Vitamin D deficiency.  10. 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  11. Chronic diarrhea occurring 5-6 times a day, 3-4 days a week, associated with some incontinence. The patient states that diarrhea has been ongoing since the time of her myeloma diagnosis which actually dates back 3 years. Apparently, the symptom is getting worse.  12. Right-sided Port-A-Cath placed on 04/09/2009.   REVIEW OF SYSTEMS:   Constitutional: Denies fevers, chills or abnormal weight loss Eyes: Denies blurriness of vision Ears, nose, mouth, throat, and  face: Denies mucositis or sore throat Respiratory: Denies cough, dyspnea or wheezes Cardiovascular: Denies palpitation, chest discomfort or lower extremity swelling Gastrointestinal:  Denies nausea, heartburn or change in bowel habits Skin: Denies abnormal skin rashes Lymphatics: Denies new lymphadenopathy or easy bruising Neurological:Denies numbness, tingling or new weaknesses Behavioral/Psych: Mood is stable, no new changes  All other systems were reviewed with the patient and are negative.  PHYSICAL EXAMINATION: ECOG PERFORMANCE STATUS: 0 - Asymptomatic  Blood pressure 148/86, pulse 120, temperature 96.7 F (35.9 C), temperature source Oral, resp. rate 18, height 5\' 4"  (1.626 m), weight 146 lb 6.4 oz (66.407 kg).  GENERAL:alert, no distress and comfortable; mildly overweight.   SKIN: skin color, texture, turgor are normal, no rashes or significant lesions EYES: normal, Conjunctiva are pink and non-injected, sclera clear OROPHARYNX:no exudate, no erythema and lips, buccal mucosa, and tongue normal  NECK: supple, thyroid normal size, non-tender, without nodularity LYMPH:  no palpable lymphadenopathy in the cervical, axillary or supraclavicular LUNGS: clear to auscultation and percussion with normal breathing effort HEART: regular rate & rhythm and no murmurs and no lower extremity edema ABDOMEN:abdomen soft, non-tender and normal bowel sounds; Right and left  lower abdomen reveals ecchymosis of varying coloration with a firm tender nodules  Musculoskeletal:no cyanosis of digits and no clubbing  NEURO: alert & oriented x 3 with fluent speech, no focal motor/sensory deficits  LABORATORY DATA: Results for orders placed in visit on 02/22/13 (from the past 48 hour(s))  CBC WITH DIFFERENTIAL     Status: Abnormal  Collection Time    02/22/13  2:55 PM      Result Value Range   WBC 7.7  3.9 - 10.3 10e3/uL   NEUT# 4.9  1.5 - 6.5 10e3/uL   HGB 13.7  11.6 - 15.9 g/dL   HCT 16.1  09.6 -  04.5 %   Platelets 188  145 - 400 10e3/uL   MCV 102.0 (*) 79.5 - 101.0 fL   MCH 33.4  25.1 - 34.0 pg   MCHC 32.7  31.5 - 36.0 g/dL   RBC 4.09  8.11 - 9.14 10e6/uL   RDW 15.2 (*) 11.2 - 14.5 %   lymph# 1.3  0.9 - 3.3 10e3/uL   MONO# 1.3 (*) 0.1 - 0.9 10e3/uL   Eosinophils Absolute 0.2  0.0 - 0.5 10e3/uL   Basophils Absolute 0.0  0.0 - 0.1 10e3/uL   NEUT% 63.8  38.4 - 76.8 %   LYMPH% 16.2  14.0 - 49.7 %   MONO% 16.5 (*) 0.0 - 14.0 %   EOS% 3.1  0.0 - 7.0 %   BASO% 0.4  0.0 - 2.0 %  KAPPA/LAMBDA LIGHT CHAINS     Status: Abnormal   Collection Time    02/22/13  2:55 PM      Result Value Range   Kappa free light chain 40.80 (*) 0.33 - 1.94 mg/dL   Lambda Free Lght Chn 0.82  0.57 - 2.63 mg/dL   Kappa:Lambda Ratio 78.29 (*) 0.26 - 1.65  COMPREHENSIVE METABOLIC PANEL (CC13)     Status: Abnormal   Collection Time    02/22/13  2:55 PM      Result Value Range   Sodium 144  136 - 145 mEq/L   Potassium 4.0  3.5 - 5.1 mEq/L   Chloride 112 (*) 98 - 109 mEq/L   CO2 26  22 - 29 mEq/L   Glucose 96  70 - 140 mg/dl   BUN 56.2  7.0 - 13.0 mg/dL   Creatinine 0.8  0.6 - 1.1 mg/dL   Total Bilirubin 8.65  0.20 - 1.20 mg/dL   Alkaline Phosphatase 52  40 - 150 U/L   AST 18  5 - 34 U/L   ALT 8  0 - 55 U/L   Total Protein 5.8 (*) 6.4 - 8.3 g/dL   Albumin 3.2 (*) 3.5 - 5.0 g/dL   Calcium 9.9  8.4 - 78.4 mg/dL   Anion Gap 7  3 - 11 mEq/L       Labs:  Lab Results  Component Value Date   WBC 7.7 02/22/2013   HGB 13.7 02/22/2013   HCT 41.9 02/22/2013   MCV 102.0* 02/22/2013   PLT 188 02/22/2013   NEUTROABS 4.9 02/22/2013      Chemistry      Component Value Date/Time   NA 144 02/22/2013 1455   NA 139 10/14/2012 1501   K 4.0 02/22/2013 1455   K 2.8* 10/14/2012 1501   CL 103 10/14/2012 1501   CL 108* 06/21/2012 1317   CO2 26 02/22/2013 1455   CO2 26 10/14/2012 1501   BUN 12.3 02/22/2013 1455   BUN 10 10/14/2012 1501   CREATININE 0.8 02/22/2013 1455   CREATININE 1.00 10/14/2012 1501   CREATININE  0.75 08/15/2009 1030      Component Value Date/Time   CALCIUM 9.9 02/22/2013 1455   CALCIUM 9.1 10/14/2012 1501   CALCIUM 10.9* 08/20/2008 0415   ALKPHOS 52 02/22/2013 1455   ALKPHOS 64 10/14/2012 1501   AST  18 02/22/2013 1455   AST 20 10/14/2012 1501   ALT 8 02/22/2013 1455   ALT 18 10/14/2012 1501   BILITOT 0.49 02/22/2013 1455   BILITOT 0.4 10/14/2012 1501     Basic Metabolic Panel:  Recent Labs Lab 02/22/13 1455  NA 144  K 4.0  CO2 26  GLUCOSE 96  BUN 12.3  CREATININE 0.8  CALCIUM 9.9   GFR Estimated Creatinine Clearance: 46.5 ml/min (by C-G formula based on Cr of 0.8). Liver Function Tests:  Recent Labs Lab 02/22/13 1455  AST 18  ALT 8  ALKPHOS 52  BILITOT 0.49  PROT 5.8*  ALBUMIN 3.2*   No results found for this basename: LIPASE, AMYLASE,  in the last 168 hours No results found for this basename: AMMONIA,  in the last 168 hours Coagulation profile No results found for this basename: INR, PROTIME,  in the last 168 hours  CBC:  Recent Labs Lab 02/22/13 1455  WBC 7.7  NEUTROABS 4.9  HGB 13.7  HCT 41.9  MCV 102.0*  PLT 188   Results for Lindsey, Dalton (MRN [id number]) as of 02/23/2013 19:48  Ref. Range 10/14/2012 14:04 12/13/2012 14:23 02/22/2013 14:55  Kappa:Lambda Ratio Latest Range: 0.26-1.65  148.00 (H) 62.61 (H) 49.76 (H)   Results for Lindsey, Dalton (MRN [id number]) as of 02/23/2013 19:48  Ref. Range 10/14/2012 14:04 12/13/2012 14:23 02/22/2013 14:55  Kappa free light chain Latest Range: 0.33-1.94 mg/dL 44.01 (H) 02.72 (H) 53.66 (H)   Results for Lindsey, Dalton (MRN [id number]) as of 02/23/2013 19:48  Ref. Range 10/19/2012 13:32 12/16/2012 14:27  Time-UPE24 No range found 24 24  Volume, Urine-UPE24 No range found 1400 1600  Total Protein, Urine-UPE24 No range found 26.3 35.6  Total Protein, Urine-Ur/day Latest Range: 10-140 mg/day 368 (H) 570 (H)  ALBUMIN, U Latest Range: DETECTED  DETECTED DETECTED  Alpha 1, Urine Latest Range: NONE DET   DETECTED (A) DETECTED (A)  Alpha 2, Urine Latest Range: NONE DET  DETECTED (A) DETECTED (A)  Beta, Urine Latest Range: NONE DET  DETECTED (A) DETECTED (A)  Gamma Globulin, Urine Latest Range: NONE DET  DETECTED (A) DETECTED (A)  Free Kappa Lt Chains,Ur Latest Range: 0.14-2.42 mg/dL 95.63 (H) 87.56 (H)  Free Lt Chn Excr Rate No range found 359.80 561.60  Free Lambda Lt Chains,Ur Latest Range: 0.02-0.67 mg/dL 4.33 2.95  Free Lambda Excretion/Day No range found 0.84 0.48  Free Kappa/Lambda Ratio Latest Range: 2.04-10.37 ratio 428.33 (H) 1170.00 (H)   RADIOGRAPHIC STUDIES: 1. Metastatic bone survey carried out on 07/20/2009 showed osteopenia. There were some small lucent lesions within the  skull consistent with a either venous lakes or multiple myeloma  deposits. There were numerous wedge compression fractures and  degenerative changes throughout the spine. There was a wedge  compression of L3 of indeterminate age.  2. Chest x-ray, 2-view, from 07/25/2010 showed no active cardiopulmonary disease.  3. Metastatic bone survey from 09/05/2010 showed unchanged osseous survey with a few lytic lesions that were seen in the skull, possibly the right femur at the junction of the middle and distal thirds. There were compression fractures of the thoracic and lumbar spine unchanged. The patient is status post vertebroplasty at T11, T12, and L1. A small lytic lesion was seen in the scapula. There was a degenerative cyst in the greater tuberosity of the left humerus.  4. Metastatic bone survey on 07/28/2011 showed minimal progression of small lytic lesions in the skull compatible with a history of multiple myeloma.  There was no significant change in the small lytic lesions in left scapula. There was probably better visualization of small lytic lesions in the proximal left femur and poor visualization of a small lytic lesion in the distal right femur. There was stable diffuse osteopenia and multiple vertebral  compression fractures.  5. Metastatic bone survey from 12/24/2011 showed multiple compression fractures in the spine which are stable. There were tiny lytic lesions seen in the humeri and in the left clavicle as well as the left scapula. These were felt to be stable. No impending  pathologic fractures.  6. Chest x-ray, 2 view, from 12/24/2011 showed no acute disease.  7. Bone density scan from 10/13/2012 showed a T-score for the lumbar spine total of -0.5. T-score of the right femoral neck was -2.8 and the T- score for the left femoral neck was -3.2.  ASSESSMENT: Lindsey Dalton 79 y.o. female with a history of Multiple myeloma - Plan: CBC with Differential, Comprehensive metabolic panel, CBC with Differential, CBC with Differential, Comprehensive metabolic panel, Comprehensive metabolic panel, DG Bone Survey Met, IFE, Urine (with Tot Prot), Kappa/lambda light chains, CANCELED: SPEP & IFE with QIG  Vitamin D deficiency  Acute pulmonary embolism - Plan: CBC with Differential, Comprehensive metabolic panel, CBC with Differential, CBC with Differential, Comprehensive metabolic panel, Comprehensive metabolic panel   PLAN:  1. Kappa light chain multiple myeloma. --Her disease was complicated by a history of multiple compression fractures with balloon kyphoplasty involving T11, T12, L1, on 08/17/2008. She is 13q minus and 11:14 translocation indicative of a poor prognosis. Durie-Salmon Stage III based on advanced lesions.   --We will obtain kappa/lambda light chains, CBC, CMP and urine protein studies.  Her creatinine and hemogloblin is stable which is reassuring.  In addition, her kappa/lambda ratio has decreased to 49.76 down from 62.61 and 148 in June of this year.  Her free kappa/lambda urine ratio had increase from June to August from 428.33 to 1170 respectively.  We will repeat this studies on her next visit.  In addition, we will repeat her bone survey to ensure absence of lytic lesion  progression.  --She will continue Vd treatment with herpes zoster prophylaxis with acyclovir.   2. Pulmonary Embolism (10/21/2012). --She was on revlimid at this time.  It has since been discontinued. PE is found within the right middle lobar pulmonary artery extending the segmental middle lobar branches, and a few scattered subsegmental arteries in the bilateral lower lobes --Continue lovenox 100 mg Prentiss daily.  Planning to treat for 6 months from day of her diagnosis.  May consider transitioning back to coumadin for her convience.  If so, will need a referral to A/C clinic.    3. Subcutaneous bruising due to injections. --Patient reports continued intermittent bruising due to her velcade injections and lovenox injections.   We will monitor closely for worsening.   4. Chronic diarrhea with hypokalemia.  --this is likely secondary to her treatment for #1. Continue immodium as needed. On Potassium chloride 10 MEQ bid.  5. CHF with CAD, Tachycardia. -- On lasix 40 mg daily.   Monitor creatinine and electrolytes. --Patient counseled to call MD with any symptoms of chest pain.   Tachycardia is commonly of symptom of PE.  We will monitor closely if persists or worsens, will check EKG and refer to cardiology.   6. Chronic back pain secondary to #1.  --Hydrocodone-acetaminophen 10-325 mg q 8 hours  All questions were answered. The patient knows to call the clinic with  any problems, questions or concerns. We can certainly see the patient much sooner if necessary.  I spent 15 minutes counseling the patient face to face. The total time spent in the appointment was 25 minutes.    Lindsey Alioto, MD 02/23/2013 8:46 PM

## 2013-02-24 ENCOUNTER — Telehealth: Payer: Self-pay | Admitting: Internal Medicine

## 2013-02-24 ENCOUNTER — Other Ambulatory Visit: Payer: Self-pay | Admitting: *Deleted

## 2013-02-24 DIAGNOSIS — C9000 Multiple myeloma not having achieved remission: Secondary | ICD-10-CM

## 2013-02-24 MED ORDER — DEXAMETHASONE 4 MG PO TABS: ORAL_TABLET | ORAL | Status: AC

## 2013-02-24 MED ORDER — ALLOPURINOL 100 MG PO TABS: 200.0000 mg | ORAL_TABLET | Freq: Every day | ORAL | Status: AC

## 2013-02-24 NOTE — Telephone Encounter (Signed)
S/w pt grd dtr re add on appts for wkly lb/tx 10/27 thru 11/10. appt for 10/20 remains the same and grd dtr will get new schedule then.

## 2013-02-28 ENCOUNTER — Ambulatory Visit (HOSPITAL_BASED_OUTPATIENT_CLINIC_OR_DEPARTMENT_OTHER): Payer: Medicare Other

## 2013-02-28 ENCOUNTER — Other Ambulatory Visit (HOSPITAL_BASED_OUTPATIENT_CLINIC_OR_DEPARTMENT_OTHER): Payer: Medicare Other | Admitting: Lab

## 2013-02-28 ENCOUNTER — Other Ambulatory Visit: Payer: Self-pay | Admitting: Internal Medicine

## 2013-02-28 VITALS — BP 152/98 | HR 102 | Temp 97.2°F

## 2013-02-28 DIAGNOSIS — I2699 Other pulmonary embolism without acute cor pulmonale: Secondary | ICD-10-CM

## 2013-02-28 DIAGNOSIS — C9000 Multiple myeloma not having achieved remission: Secondary | ICD-10-CM

## 2013-02-28 DIAGNOSIS — Z5112 Encounter for antineoplastic immunotherapy: Secondary | ICD-10-CM

## 2013-02-28 LAB — CBC WITH DIFFERENTIAL/PLATELET
Basophils Absolute: 0 10*3/uL (ref 0.0–0.1)
Eosinophils Absolute: 0.2 10*3/uL (ref 0.0–0.5)
HCT: 38.8 % (ref 34.8–46.6)
HGB: 12.7 g/dL (ref 11.6–15.9)
MCH: 33.2 pg (ref 25.1–34.0)
MCV: 101.3 fL — ABNORMAL HIGH (ref 79.5–101.0)
MONO%: 16.1 % — ABNORMAL HIGH (ref 0.0–14.0)
NEUT#: 3.5 10*3/uL (ref 1.5–6.5)
NEUT%: 63.6 % (ref 38.4–76.8)
Platelets: 173 10*3/uL (ref 145–400)
RBC: 3.83 10*6/uL (ref 3.70–5.45)
RDW: 14.6 % — ABNORMAL HIGH (ref 11.2–14.5)
lymph#: 0.9 10*3/uL (ref 0.9–3.3)

## 2013-02-28 LAB — COMPREHENSIVE METABOLIC PANEL (CC13)
Albumin: 2.9 g/dL — ABNORMAL LOW (ref 3.5–5.0)
Alkaline Phosphatase: 48 U/L (ref 40–150)
Anion Gap: 7 mEq/L (ref 3–11)
BUN: 9.8 mg/dL (ref 7.0–26.0)
CO2: 28 mEq/L (ref 22–29)
Calcium: 9.4 mg/dL (ref 8.4–10.4)
Creatinine: 0.7 mg/dL (ref 0.6–1.1)
Glucose: 103 mg/dl (ref 70–140)
Potassium: 4 mEq/L (ref 3.5–5.1)
Sodium: 143 mEq/L (ref 136–145)

## 2013-02-28 MED ORDER — BORTEZOMIB CHEMO SQ INJECTION 3.5 MG (2.5MG/ML)
1.3000 mg/m2 | Freq: Once | INTRAMUSCULAR | Status: AC
  Administered 2013-02-28: 2.25 mg via SUBCUTANEOUS
  Filled 2013-02-28: qty 2.25

## 2013-02-28 MED ORDER — ONDANSETRON HCL 8 MG PO TABS
8.0000 mg | ORAL_TABLET | Freq: Once | ORAL | Status: AC
  Administered 2013-02-28: 8 mg via ORAL

## 2013-03-01 ENCOUNTER — Ambulatory Visit (HOSPITAL_COMMUNITY)
Admission: RE | Admit: 2013-03-01 | Discharge: 2013-03-01 | Disposition: A | Discharge status: Home / Self Care | Payer: Medicare Other | Source: Ambulatory Visit | Attending: Internal Medicine | Admitting: Internal Medicine

## 2013-03-01 DIAGNOSIS — M47817 Spondylosis without myelopathy or radiculopathy, lumbosacral region: Secondary | ICD-10-CM | POA: Insufficient documentation

## 2013-03-01 DIAGNOSIS — C9000 Multiple myeloma not having achieved remission: Secondary | ICD-10-CM | POA: Insufficient documentation

## 2013-03-07 ENCOUNTER — Other Ambulatory Visit: Payer: Self-pay | Admitting: Internal Medicine

## 2013-03-07 ENCOUNTER — Other Ambulatory Visit (HOSPITAL_BASED_OUTPATIENT_CLINIC_OR_DEPARTMENT_OTHER): Payer: Medicare Other | Admitting: Lab

## 2013-03-07 ENCOUNTER — Ambulatory Visit (HOSPITAL_BASED_OUTPATIENT_CLINIC_OR_DEPARTMENT_OTHER): Payer: Medicare Other

## 2013-03-07 VITALS — BP 118/65 | HR 102 | Temp 97.6°F | Resp 18

## 2013-03-07 DIAGNOSIS — C9000 Multiple myeloma not having achieved remission: Secondary | ICD-10-CM

## 2013-03-07 DIAGNOSIS — Z5112 Encounter for antineoplastic immunotherapy: Secondary | ICD-10-CM

## 2013-03-07 DIAGNOSIS — I2699 Other pulmonary embolism without acute cor pulmonale: Secondary | ICD-10-CM

## 2013-03-07 LAB — COMPREHENSIVE METABOLIC PANEL (CC13)
ALT: 16 U/L (ref 0–55)
Albumin: 3 g/dL — ABNORMAL LOW (ref 3.5–5.0)
Alkaline Phosphatase: 50 U/L (ref 40–150)
Glucose: 96 mg/dl (ref 70–140)
Sodium: 143 mEq/L (ref 136–145)
Total Bilirubin: 0.4 mg/dL (ref 0.20–1.20)
Total Protein: 5.3 g/dL — ABNORMAL LOW (ref 6.4–8.3)

## 2013-03-07 LAB — CBC WITH DIFFERENTIAL/PLATELET
BASO%: 0.2 % (ref 0.0–2.0)
EOS%: 4 % (ref 0.0–7.0)
HGB: 12.9 g/dL (ref 11.6–15.9)
LYMPH%: 19.3 % (ref 14.0–49.7)
MCHC: 33.1 g/dL (ref 31.5–36.0)
MCV: 100.8 fL (ref 79.5–101.0)
MONO%: 15.9 % — ABNORMAL HIGH (ref 0.0–14.0)
Platelets: 188 10*3/uL (ref 145–400)
RBC: 3.87 10*6/uL (ref 3.70–5.45)
lymph#: 1.2 10*3/uL (ref 0.9–3.3)

## 2013-03-07 MED ORDER — ONDANSETRON HCL 8 MG PO TABS
ORAL_TABLET | ORAL | Status: AC
  Filled 2013-03-07: qty 1

## 2013-03-07 MED ORDER — HEPARIN SOD (PORK) LOCK FLUSH 100 UNIT/ML IV SOLN
500.0000 [IU] | Freq: Once | INTRAVENOUS | Status: AC
  Administered 2013-03-07: 500 [IU] via INTRAVENOUS
  Filled 2013-03-07: qty 5

## 2013-03-07 MED ORDER — BORTEZOMIB CHEMO SQ INJECTION 3.5 MG (2.5MG/ML)
1.3000 mg/m2 | Freq: Once | INTRAMUSCULAR | Status: AC
  Administered 2013-03-07: 2.25 mg via SUBCUTANEOUS
  Filled 2013-03-07: qty 2.25

## 2013-03-07 MED ORDER — SODIUM CHLORIDE 0.9 % IJ SOLN
10.0000 mL | Freq: Once | INTRAMUSCULAR | Status: AC
  Administered 2013-03-07: 10 mL via INTRAVENOUS
  Filled 2013-03-07: qty 10

## 2013-03-07 MED ORDER — ONDANSETRON HCL 8 MG PO TABS
8.0000 mg | ORAL_TABLET | Freq: Once | ORAL | Status: AC
  Administered 2013-03-07: 8 mg via ORAL

## 2013-03-07 NOTE — Progress Notes (Signed)
Patient asked if she was due for a port-a-cath flush today.  Noted last flush was 10-14-2012.  Will flush port-a-cath today and notify scheduling to do future appointments every eight weeks.

## 2013-03-07 NOTE — Patient Instructions (Signed)
[facility] Discharge Instructions for Patients Receiving Chemotherapy  Today you received the following chemotherapy agents Velcade   To help prevent nausea and vomiting after your treatment, we encourage you to take your nausea medication Zofran 8mg  every 8 hours as needed for nausea or vomiting.   If you develop nausea and vomiting that is not controlled by your nausea medication, call the clinic.   BELOW ARE SYMPTOMS THAT SHOULD BE REPORTED IMMEDIATELY:  *FEVER GREATER THAN 100.5 F  *CHILLS WITH OR WITHOUT FEVER  NAUSEA AND VOMITING THAT IS NOT CONTROLLED WITH YOUR NAUSEA MEDICATION  *UNUSUAL SHORTNESS OF BREATH  *UNUSUAL BRUISING OR BLEEDING  TENDERNESS IN MOUTH AND THROAT WITH OR WITHOUT PRESENCE OF ULCERS  *URINARY PROBLEMS  *BOWEL PROBLEMS  UNUSUAL RASH Items with * indicate a potential emergency and should be followed up as soon as possible.  Feel free to call the clinic you have any questions or concerns. The clinic phone number is [phone].

## 2013-03-07 NOTE — Progress Notes (Signed)
Discharged with daughter to home ambulatory in no distress

## 2013-03-08 ENCOUNTER — Other Ambulatory Visit: Payer: Medicare Other | Admitting: Lab

## 2013-03-08 ENCOUNTER — Ambulatory Visit: Payer: Medicare Other

## 2013-03-09 ENCOUNTER — Telehealth: Payer: Self-pay | Admitting: Internal Medicine

## 2013-03-09 LAB — UIFE/LIGHT CHAINS/TP QN, 24-HR UR
Albumin, U: DETECTED
Free Kappa Lt Chains,Ur: 66.6 mg/dL — ABNORMAL HIGH (ref 0.14–2.42)
Free Lambda Excretion/Day: 0.38 mg/d
Free Lambda Lt Chains,Ur: 0.04 mg/dL (ref 0.02–0.67)
Gamma Globulin, Urine: DETECTED — AB
Time: 24 hours
Volume, Urine: 950 mL

## 2013-03-09 NOTE — Telephone Encounter (Signed)
s.w. pt and advised on added flushes...she will pick up extended sched at nxt visit

## 2013-03-14 ENCOUNTER — Encounter: Payer: Self-pay | Admitting: Internal Medicine

## 2013-03-14 ENCOUNTER — Ambulatory Visit (HOSPITAL_BASED_OUTPATIENT_CLINIC_OR_DEPARTMENT_OTHER): Payer: Medicare Other

## 2013-03-14 ENCOUNTER — Ambulatory Visit (HOSPITAL_BASED_OUTPATIENT_CLINIC_OR_DEPARTMENT_OTHER): Payer: Medicare Other | Admitting: Internal Medicine

## 2013-03-14 ENCOUNTER — Other Ambulatory Visit (HOSPITAL_BASED_OUTPATIENT_CLINIC_OR_DEPARTMENT_OTHER): Payer: Medicare Other | Admitting: Lab

## 2013-03-14 VITALS — BP 145/93 | HR 106 | Temp 98.5°F | Resp 18 | Ht 64.0 in | Wt 148.5 lb

## 2013-03-14 DIAGNOSIS — E876 Hypokalemia: Secondary | ICD-10-CM

## 2013-03-14 DIAGNOSIS — R197 Diarrhea, unspecified: Secondary | ICD-10-CM

## 2013-03-14 DIAGNOSIS — G8929 Other chronic pain: Secondary | ICD-10-CM

## 2013-03-14 DIAGNOSIS — I2699 Other pulmonary embolism without acute cor pulmonale: Secondary | ICD-10-CM

## 2013-03-14 DIAGNOSIS — Z5112 Encounter for antineoplastic immunotherapy: Secondary | ICD-10-CM

## 2013-03-14 DIAGNOSIS — M549 Dorsalgia, unspecified: Secondary | ICD-10-CM

## 2013-03-14 DIAGNOSIS — C9000 Multiple myeloma not having achieved remission: Secondary | ICD-10-CM

## 2013-03-14 DIAGNOSIS — R000 Tachycardia, unspecified: Secondary | ICD-10-CM

## 2013-03-14 DIAGNOSIS — I2510 Atherosclerotic heart disease of native coronary artery without angina pectoris: Secondary | ICD-10-CM

## 2013-03-14 DIAGNOSIS — I509 Heart failure, unspecified: Secondary | ICD-10-CM

## 2013-03-14 LAB — CBC WITH DIFFERENTIAL/PLATELET
Basophils Absolute: 0 10*3/uL (ref 0.0–0.1)
EOS%: 3.9 % (ref 0.0–7.0)
HCT: 40 % (ref 34.8–46.6)
HGB: 13 g/dL (ref 11.6–15.9)
MCH: 33.4 pg (ref 25.1–34.0)
MCV: 102.2 fL — ABNORMAL HIGH (ref 79.5–101.0)
MONO#: 1 10*3/uL — ABNORMAL HIGH (ref 0.1–0.9)
MONO%: 18.3 % — ABNORMAL HIGH (ref 0.0–14.0)
NEUT%: 59 % (ref 38.4–76.8)

## 2013-03-14 LAB — COMPREHENSIVE METABOLIC PANEL (CC13)
AST: 23 U/L (ref 5–34)
Alkaline Phosphatase: 51 U/L (ref 40–150)
Anion Gap: 8 mEq/L (ref 3–11)
BUN: 9.4 mg/dL (ref 7.0–26.0)
CO2: 29 mEq/L (ref 22–29)
Calcium: 9.8 mg/dL (ref 8.4–10.4)
Chloride: 108 mEq/L (ref 98–109)
Creatinine: 0.7 mg/dL (ref 0.6–1.1)
Glucose: 94 mg/dl (ref 70–140)
Potassium: 3.9 mEq/L (ref 3.5–5.1)
Total Protein: 5.4 g/dL — ABNORMAL LOW (ref 6.4–8.3)

## 2013-03-14 MED ORDER — ONDANSETRON HCL 8 MG PO TABS
8.0000 mg | ORAL_TABLET | Freq: Once | ORAL | Status: AC
  Administered 2013-03-14: 8 mg via ORAL

## 2013-03-14 MED ORDER — ONDANSETRON HCL 8 MG PO TABS
ORAL_TABLET | ORAL | Status: AC
  Filled 2013-03-14: qty 1

## 2013-03-14 MED ORDER — BORTEZOMIB CHEMO SQ INJECTION 3.5 MG (2.5MG/ML)
1.3000 mg/m2 | Freq: Once | INTRAMUSCULAR | Status: AC
  Administered 2013-03-14: 2.25 mg via SUBCUTANEOUS
  Filled 2013-03-14: qty 2.25

## 2013-03-14 NOTE — Patient Instructions (Signed)
[facility] Discharge Instructions for Patients Receiving Chemotherapy  Today you received the following chemotherapy agents velcade.  To help prevent nausea and vomiting after your treatment, we encourage you to take your nausea medication.   If you develop nausea and vomiting that is not controlled by your nausea medication, call the clinic.   BELOW ARE SYMPTOMS THAT SHOULD BE REPORTED IMMEDIATELY:  *FEVER GREATER THAN 100.5 F  *CHILLS WITH OR WITHOUT FEVER  NAUSEA AND VOMITING THAT IS NOT CONTROLLED WITH YOUR NAUSEA MEDICATION  *UNUSUAL SHORTNESS OF BREATH  *UNUSUAL BRUISING OR BLEEDING  TENDERNESS IN MOUTH AND THROAT WITH OR WITHOUT PRESENCE OF ULCERS  *URINARY PROBLEMS  *BOWEL PROBLEMS  UNUSUAL RASH Items with * indicate a potential emergency and should be followed up as soon as possible.  Feel free to call the clinic you have any questions or concerns. The clinic phone number is [phone].    

## 2013-03-15 ENCOUNTER — Ambulatory Visit: Payer: Medicare Other

## 2013-03-15 ENCOUNTER — Telehealth: Payer: Self-pay | Admitting: *Deleted

## 2013-03-15 ENCOUNTER — Other Ambulatory Visit: Payer: Medicare Other | Admitting: Lab

## 2013-03-15 NOTE — Telephone Encounter (Signed)
Per staff message and POF I have scheduled appts.  JMW  

## 2013-03-16 ENCOUNTER — Telehealth: Payer: Self-pay | Admitting: Internal Medicine

## 2013-03-16 LAB — KAPPA/LAMBDA LIGHT CHAINS
Kappa free light chain: 68.3 mg/dL — ABNORMAL HIGH (ref 0.33–1.94)
Kappa:Lambda Ratio: 136.6 — ABNORMAL HIGH (ref 0.26–1.65)
Lambda Free Lght Chn: 0.5 mg/dL — ABNORMAL LOW (ref 0.57–2.63)

## 2013-03-16 NOTE — Progress Notes (Signed)
[facility] OFFICE PROGRESS NOTE  [name], MD [address] [place] [address]  DIAGNOSIS: Multiple myeloma - Plan: CBC with Differential in 1 month, Comprehensive metabolic panel, Kappa/lambda light chains  Acute pulmonary embolism - Plan: CBC with Differential in 1 month, Comprehensive metabolic panel, Kappa/lambda light chains  Chief Complaint  Patient presents with  . Multiple Myeloma   PAST THERAPY: -Zometa 3 mg about every 3 months from 09/15/2008 through 11/01/2010. 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  Revlimid 10 mg daily 3 weeks out of every 4 weeks. Revlimid was started in June 2010 but discontinued due to development of PE.   CURRENT THERAPY: Velcade 1.3 mg per meter squared  subcutaneously weekly, decadron 8 mg PO weekly (Vd)  INTERVAL HISTORY: Lindsey Dalton 79 y.o. female with a history of multiple co-morbidities as noted below, including kappa light chain multiple myeloma is here for follow-up.  She was last seen by me on  on 02/22/13.   She had mild diarrhea last night.  She complains of fatigue especially following receipt of her velcade.  Otherwise, she denies bone pain or fevers/chills or acute shortness of breath.    On 01/11/2013 and she was asked to discontinue giving her self her Lovenox injections in the abdomen and to use her anterior lateral thigh region for these injections, allowing the abdomen to heal. She states that the bruising is significantly better on her abdomen although she is experiencing some bruising on her thighs it is not to the extent that she was having on her abdomen. Her complaints of fatigue have remained stable with no increasing fatigue. She is now taking the acyclovir as prescribed.  She denied nausea, vomiting, diarrhea or constipation.   MEDICAL HISTORY: Past Medical History  Diagnosis Date  . CHF (congestive heart failure)   .  Diverticulosis   . Lower GI bleed   . Hiatal hernia   . Hypercalcemia   . Osteoporosis with fracture   . CAD (coronary artery disease)   . HTN (hypertension)   . DJD (degenerative joint disease)   . Dyslipidemia   . Multiple myeloma 04/25/2011  . Vitamin D deficiency 04/25/2011  . Anemia     INTERIM HISTORY: has Multiple myeloma; Vitamin d deficiency; Diarrhea; Loss of weight; Dyspnea; and Acute pulmonary embolism on her problem list.    ALLERGIES:  has No Known Allergies.  MEDICATIONS: has a current medication list which includes the following prescription(s): acyclovir, allopurinol, atenolol, attapulgite, calcium citrate-vitamin d, cholecalciferol, citalopram, dexamethasone, diphenoxylate-atropine, enoxaparin, ferrous gluconate, furosemide, guaifenesin, hydrocodone-acetaminophen, loperamide hcl, nystatin, ondansetron, potassium chloride, promethazine, promethazine, ranitidine, simvastatin, and temazepam.  SURGICAL HISTORY:  Past Surgical History  Procedure Laterality Date  . Fixation kyphoplasty      T11, T12, L1  . Total abdominal hysterectomy    . Esophagogastroduodenoscopy  03/01/2012    Procedure: ESOPHAGOGASTRODUODENOSCOPY (EGD);  Surgeon: Hart Carwin, MD;  Location: [facility];  Service: Endoscopy;  Laterality: N/A;  . Flexible sigmoidoscopy  03/01/2012    Procedure: FLEXIBLE SIGMOIDOSCOPY;  Surgeon: Hart Carwin, MD;  Location: [facility];  Service: Endoscopy;  Laterality: N/A;   PROBLEM LIST:  1. Kappa light chain multiple myeloma diagnosed in April 2010 with 51% plasma cells in the marrow. Kappa light chains evident on serum and urine immunofixation electrophoresis and 8.3 gm of protein in the urine. FISH studies showed 13q minus and 11:14 translocation indicative of a poor prognosis. The patient initially  was treated with Velcade and Decadron from mid-May 2010 through mid-May 2011 with an excellent response. She also received Zometa from 09/15/08 to 11/01/10. The  patient also has been receiving Revlimid since June 2010 and has continued on Revlimid and Decadron since that time in a program as described above. She has had an excellent response to treatment. Last metastatic bone survey was on 12/24/2011 and showed no significant changes when compared with the prior study of 07/20/2011. There were a few lytic lesions, but no evidence for progression. The patient's last 24-hour urine collection was on 12/24/2011 and revealed 58 mg of protein. The UIFE was negative for monoclonal light chains. We  have not repeated the bone marrow since the initial bone marrow of April 2010 which showed 51% plasma cells.  2. History of congestive heart failure with coronary artery disease  status post cardiac catheterizations in 2009.  3. History of diverticulosis and lower GI bleed in May 2010.  4. History of multiple compression fractures with balloon kyphoplasty  involving T11, T12, L1, on 08/17/2008.  5. Hypertension.  6. Dyslipidemia.  7. Hiatal hernia.  8. Severe degenerative joint disease.  9. Vitamin D deficiency.  10. 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  11. Chronic diarrhea occurring 5-6 times a day, 3-4 days a week, associated with some incontinence. The patient states that diarrhea has been ongoing since the time of her myeloma diagnosis which actually dates back 3 years. Apparently, the symptom is getting worse.  12. Right-sided Port-A-Cath placed on 04/09/2009.   REVIEW OF SYSTEMS:   Constitutional: Denies fevers, chills or abnormal weight loss Eyes: Denies blurriness of vision Ears, nose, mouth, throat, and face: Denies mucositis or sore throat Respiratory: Denies cough, dyspnea or wheezes Cardiovascular: Denies palpitation, chest discomfort or lower extremity swelling Gastrointestinal:  Denies nausea, heartburn or change in bowel habits Skin: Denies abnormal skin rashes Lymphatics: Denies new lymphadenopathy or  easy bruising Neurological:Denies numbness, tingling or new weaknesses Behavioral/Psych: Mood is stable, no new changes  All other systems were reviewed with the patient and are negative.  PHYSICAL EXAMINATION: ECOG PERFORMANCE STATUS: 0 - Asymptomatic  Blood pressure 145/93, pulse 106, temperature 98.5 F (36.9 C), temperature source Oral, resp. rate 18, height 5\' 4"  (1.626 m), weight 148 lb 8 oz (67.359 kg), SpO2 94.00%.  GENERAL:alert, no distress and comfortable; mildly overweight.   SKIN: skin color, texture, turgor are normal, no rashes or significant lesions EYES: normal, Conjunctiva are pink and non-injected, sclera clear OROPHARYNX:no exudate, no erythema and lips, buccal mucosa, and tongue normal  NECK: supple, thyroid normal size, non-tender, without nodularity LYMPH:  no palpable lymphadenopathy in the cervical, axillary or supraclavicular LUNGS: clear to auscultation and percussion with normal breathing effort HEART: regular rate & rhythm and no murmurs and no lower extremity edema ABDOMEN:abdomen soft, non-tender and normal bowel sounds; Right and left  lower abdomen reveals ecchymosis of varying coloration with a firm tender nodules  Musculoskeletal:no cyanosis of digits and no clubbing  NEURO: alert & oriented x 3 with fluent speech, no focal motor/sensory deficits  LABORATORY DATA: Results for orders placed in visit on 03/14/13 (from the past 48 hour(s))  CBC WITH DIFFERENTIAL     Status: Abnormal   Collection Time    03/14/13  2:13 PM      Result Value Range   WBC 5.5  3.9 - 10.3 10e3/uL   NEUT# 3.2  1.5 - 6.5 10e3/uL   HGB 13.0  11.6 -  15.9 g/dL   HCT 16.1  09.6 - 04.5 %   Platelets 175  145 - 400 10e3/uL   MCV 102.2 (*) 79.5 - 101.0 fL   MCH 33.4  25.1 - 34.0 pg   MCHC 32.6  31.5 - 36.0 g/dL   RBC 4.09  8.11 - 9.14 10e6/uL   RDW 15.2 (*) 11.2 - 14.5 %   lymph# 1.0  0.9 - 3.3 10e3/uL   MONO# 1.0 (*) 0.1 - 0.9 10e3/uL   Eosinophils Absolute 0.2  0.0 - 0.5  10e3/uL   Basophils Absolute 0.0  0.0 - 0.1 10e3/uL   NEUT% 59.0  38.4 - 76.8 %   LYMPH% 18.4  14.0 - 49.7 %   MONO% 18.3 (*) 0.0 - 14.0 %   EOS% 3.9  0.0 - 7.0 %   BASO% 0.4  0.0 - 2.0 %  COMPREHENSIVE METABOLIC PANEL (CC13)     Status: Abnormal   Collection Time    03/14/13  2:13 PM      Result Value Range   Sodium 145  136 - 145 mEq/L   Potassium 3.9  3.5 - 5.1 mEq/L   Chloride 108  98 - 109 mEq/L   CO2 29  22 - 29 mEq/L   Glucose 94  70 - 140 mg/dl   BUN 9.4  7.0 - 78.2 mg/dL   Creatinine 0.7  0.6 - 1.1 mg/dL   Total Bilirubin 9.56  0.20 - 1.20 mg/dL   Alkaline Phosphatase 51  40 - 150 U/L   AST 23  5 - 34 U/L   ALT 11  0 - 55 U/L   Total Protein 5.4 (*) 6.4 - 8.3 g/dL   Albumin 3.1 (*) 3.5 - 5.0 g/dL   Calcium 9.8  8.4 - 21.3 mg/dL   Anion Gap 8  3 - 11 mEq/L       Labs:  Lab Results  Component Value Date   WBC 5.5 03/14/2013   HGB 13.0 03/14/2013   HCT 40.0 03/14/2013   MCV 102.2* 03/14/2013   PLT 175 03/14/2013   NEUTROABS 3.2 03/14/2013      Chemistry      Component Value Date/Time   NA 145 03/14/2013 1413   NA 139 10/14/2012 1501   K 3.9 03/14/2013 1413   K 2.8* 10/14/2012 1501   CL 103 10/14/2012 1501   CL 108* 06/21/2012 1317   CO2 29 03/14/2013 1413   CO2 26 10/14/2012 1501   BUN 9.4 03/14/2013 1413   BUN 10 10/14/2012 1501   CREATININE 0.7 03/14/2013 1413   CREATININE 1.00 10/14/2012 1501   CREATININE 0.75 08/15/2009 1030      Component Value Date/Time   CALCIUM 9.8 03/14/2013 1413   CALCIUM 9.1 10/14/2012 1501   CALCIUM 10.9* 08/20/2008 0415   ALKPHOS 51 03/14/2013 1413   ALKPHOS 64 10/14/2012 1501   AST 23 03/14/2013 1413   AST 20 10/14/2012 1501   ALT 11 03/14/2013 1413   ALT 18 10/14/2012 1501   BILITOT 0.36 03/14/2013 1413   BILITOT 0.4 10/14/2012 1501     Basic Metabolic Panel:  Recent Labs Lab 03/14/13 1413  NA 145  K 3.9  CO2 29  GLUCOSE 94  BUN 9.4  CREATININE 0.7  CALCIUM 9.8   GFR Estimated Creatinine Clearance: 46.8 ml/min (by C-G formula based on  Cr of 0.7). Liver Function Tests:  Recent Labs Lab 03/14/13 1413  AST 23  ALT 11  ALKPHOS 51  BILITOT 0.36  PROT 5.4*  ALBUMIN 3.1*   No results found for this basename: LIPASE, AMYLASE,  in the last 168 hours No results found for this basename: AMMONIA,  in the last 168 hours Coagulation profile No results found for this basename: INR, PROTIME,  in the last 168 hours  CBC:  Recent Labs Lab 03/14/13 1413  WBC 5.5  NEUTROABS 3.2  HGB 13.0  HCT 40.0  MCV 102.2*  PLT 175   Results for EMPRESS, NEWMANN (MRN [id number]) as of 02/23/2013 19:48  Ref. Range 10/14/2012 14:04 12/13/2012 14:23 02/22/2013 14:55  Kappa:Lambda Ratio Latest Range: 0.26-1.65  148.00 (H) 62.61 (H) 49.76 (H)   Results for MORGIN, HALLS (MRN [id number]) as of 02/23/2013 19:48  Ref. Range 10/14/2012 14:04 12/13/2012 14:23 02/22/2013 14:55  Kappa free light chain Latest Range: 0.33-1.94 mg/dL 08.65 (H) 78.46 (H) 96.29 (H)   Results for DORSIE, SETHI (MRN [id number]) as of 03/16/2013 04:44  Ref. Range 10/19/2012 13:32 12/16/2012 14:27 03/07/2013 15:11  Time-UPE24 No range found 24 24 24   Volume, Urine-UPE24 No range found 1400 1600 950  Total Protein, Urine-UPE24 No range found 26.3 35.6 67.1  Total Protein, Urine-Ur/day Latest Range: 10-140 mg/day 368 (H) 570 (H) 637 (H)  ALBUMIN, U Latest Range: DETECTED  DETECTED DETECTED DETECTED  Alpha 1, Urine Latest Range: NONE DET  DETECTED (A) DETECTED (A) DETECTED (A)  Alpha 2, Urine Latest Range: NONE DET  DETECTED (A) DETECTED (A) DETECTED (A)  Beta, Urine Latest Range: NONE DET  DETECTED (A) DETECTED (A) DETECTED (A)  Gamma Globulin, Urine Latest Range: NONE DET  DETECTED (A) DETECTED (A) DETECTED (A)  Free Kappa Lt Chains,Ur Latest Range: 0.14-2.42 mg/dL 01.02 (H) 72.53 (H) 66.44 (H)  Free Lt Chn Excr Rate No range found 359.80 561.60 632.70  Free Lambda Lt Chains,Ur Latest Range: 0.02-0.67 mg/dL 0.34 7.42 5.95  Free Lambda Excretion/Day No range found  0.84 0.48 0.38  Free Kappa/Lambda Ratio Latest Range: 2.04-10.37 ratio 428.33 (H) 1170.00 (H) 1665.00 (H)    RADIOGRAPHIC STUDIES: 1. Metastatic bone survey carried out on 07/20/2009 showed osteopenia. There were some small lucent lesions within the  skull consistent with a either venous lakes or multiple myeloma  deposits. There were numerous wedge compression fractures and  degenerative changes throughout the spine. There was a wedge  compression of L3 of indeterminate age.  2. Chest x-ray, 2-view, from 07/25/2010 showed no active cardiopulmonary disease.  3. Metastatic bone survey from 09/05/2010 showed unchanged osseous survey with a few lytic lesions that were seen in the skull, possibly the right femur at the junction of the middle and distal thirds. There were compression fractures of the thoracic and lumbar spine unchanged. The patient is status post vertebroplasty at T11, T12, and L1. A small lytic lesion was seen in the scapula. There was a degenerative cyst in the greater tuberosity of the left humerus.  4. Metastatic bone survey on 07/28/2011 showed minimal progression of small lytic lesions in the skull compatible with a history of multiple myeloma. There was no significant change in the small lytic lesions in left scapula. There was probably better visualization of small lytic lesions in the proximal left femur and poor visualization of a small lytic lesion in the distal right femur. There was stable diffuse osteopenia and multiple vertebral compression fractures.  5. Metastatic bone survey from 12/24/2011 showed multiple compression fractures in the spine which are stable. There were tiny lytic lesions seen in the humeri and in the left clavicle  as well as the left scapula. These were felt to be stable. No impending  pathologic fractures.  6. Chest x-ray, 2 view, from 12/24/2011 showed no acute disease.  7. Bone density scan from 10/13/2012 showed a T-score for the lumbar spine total  of -0.5. T-score of the right femoral neck was -2.8 and the T- score for the left femoral neck was -3.2. 8. Metastatic bone survey from 03/01/2013 showed  1. Stable myeloma lesions observed in the skull, left clavicle, left scapula, and right humerus. No new lesion identified.  2. Multiple old compression fractures in the spine, with multilevel vertebral augmentation.  ASSESSMENT: Lindsey Dalton 79 y.o. female with a history of Multiple myeloma - Plan: CBC with Differential in 1 month, Comprehensive metabolic panel, Kappa/lambda light chains  Acute pulmonary embolism - Plan: CBC with Differential in 1 month, Comprehensive metabolic panel, Kappa/lambda light chains  PLAN:  1. Kappa light chain multiple myeloma. --Her disease was complicated by a history of multiple compression fractures with balloon kyphoplasty involving T11, T12, L1, on 08/17/2008. She is 13q minus and 11:14 translocation indicative of a poor prognosis. Durie-Salmon Stage III based on advanced lesions.   --We will obtain kappa/lambda light chains, CBC, CMP and urine protein studies.  Her creatinine and hemogloblin is stable which is reassuring.  In addition, her kappa/lambda ratio has decreased to 49.76 down from 62.61 and 148 in June of this year.  Her free kappa/lambda urine ratio had increase from June to August from 428.33 to 1170 respectively.  Her skeletal survey is stable.  Her urine studies are concerning for progression.   We changing her regiment next visit to carfilzomib plus dexamethasone or melphalan/prednisone/borteomib (MPB) next visit.   --She will continue Vd treatment with herpes zoster prophylaxis with acyclovir. She requests a period of one week off for recovery.  She will have 11/10 off followed by Vd on 11/17, 11/24, 12/01.    2. Pulmonary Embolism (10/21/2012). --She was on revlimid at this time.  It has since been discontinued. PE is found within the right middle lobar pulmonary artery extending the  segmental middle lobar branches, and a few scattered subsegmental arteries in the bilateral lower lobes --Continue lovenox 100 mg [facility] daily.  Planning to treat for 6 months from day of her diagnosis.  May consider transitioning back to coumadin for her convience.  If so, will need a referral to A/C clinic.    3. Subcutaneous bruising due to injections. --Patient reports continued intermittent bruising due to her velcade injections and lovenox injections.   We will monitor closely for worsening.   4. Chronic diarrhea with hypokalemia.  --this is likely secondary to her treatment for #1. Continue immodium as needed. On Potassium chloride 10 MEQ bid.  5. CHF with CAD, Tachycardia. -- On lasix 40 mg daily.   Monitor creatinine and electrolytes. --Patient counseled to call MD with any symptoms of chest pain.   Tachycardia is commonly of symptom of PE.  We will monitor closely if persists or worsens, will check EKG and refer to cardiology.   6. Chronic back pain secondary to #1.  --Hydrocodone-acetaminophen 10-325 mg q 8 hours  All questions were answered. The patient knows to call the clinic with any problems, questions or concerns. We can certainly see the patient much sooner if necessary.  I spent 15 minutes counseling the patient face to face. The total time spent in the appointment was 25 minutes.    Melesio Madara, MD 03/16/2013 4:40 AM

## 2013-03-16 NOTE — Telephone Encounter (Signed)
Discussed MM labs concerning for disease progression on present MM therapy.   We will consider MP regiment.  She is agreeable.

## 2013-03-21 ENCOUNTER — Other Ambulatory Visit: Payer: Medicare Other | Admitting: Lab

## 2013-03-21 ENCOUNTER — Ambulatory Visit: Payer: Medicare Other

## 2013-03-22 ENCOUNTER — Other Ambulatory Visit: Payer: Medicare Other | Admitting: Lab

## 2013-03-22 ENCOUNTER — Ambulatory Visit: Payer: Medicare Other

## 2013-03-28 ENCOUNTER — Ambulatory Visit (HOSPITAL_BASED_OUTPATIENT_CLINIC_OR_DEPARTMENT_OTHER): Payer: Medicare Other

## 2013-03-28 ENCOUNTER — Other Ambulatory Visit (HOSPITAL_BASED_OUTPATIENT_CLINIC_OR_DEPARTMENT_OTHER): Payer: Medicare Other | Admitting: Lab

## 2013-03-28 VITALS — BP 119/65 | HR 108 | Temp 98.8°F

## 2013-03-28 DIAGNOSIS — C9000 Multiple myeloma not having achieved remission: Secondary | ICD-10-CM

## 2013-03-28 DIAGNOSIS — Z5112 Encounter for antineoplastic immunotherapy: Secondary | ICD-10-CM

## 2013-03-28 LAB — CBC WITH DIFFERENTIAL/PLATELET
Basophils Absolute: 0 10*3/uL (ref 0.0–0.1)
Eosinophils Absolute: 0.2 10*3/uL (ref 0.0–0.5)
HCT: 41.1 % (ref 34.8–46.6)
HGB: 13.2 g/dL (ref 11.6–15.9)
LYMPH%: 17.9 % (ref 14.0–49.7)
MONO#: 1 10*3/uL — ABNORMAL HIGH (ref 0.1–0.9)
MONO%: 16.3 % — ABNORMAL HIGH (ref 0.0–14.0)
NEUT#: 3.9 10*3/uL (ref 1.5–6.5)
NEUT%: 62.1 % (ref 38.4–76.8)
Platelets: 227 10*3/uL (ref 145–400)
WBC: 6.3 10*3/uL (ref 3.9–10.3)
lymph#: 1.1 10*3/uL (ref 0.9–3.3)

## 2013-03-28 LAB — COMPREHENSIVE METABOLIC PANEL (CC13)
ALT: 11 U/L (ref 0–55)
Anion Gap: 7 mEq/L (ref 3–11)
BUN: 10.1 mg/dL (ref 7.0–26.0)
CO2: 28 mEq/L (ref 22–29)
Calcium: 9.9 mg/dL (ref 8.4–10.4)
Chloride: 110 mEq/L — ABNORMAL HIGH (ref 98–109)
Creatinine: 0.8 mg/dL (ref 0.6–1.1)
Glucose: 108 mg/dl (ref 70–140)
Total Bilirubin: 0.47 mg/dL (ref 0.20–1.20)
Total Protein: 5.4 g/dL — ABNORMAL LOW (ref 6.4–8.3)

## 2013-03-28 MED ORDER — ONDANSETRON HCL 8 MG PO TABS
ORAL_TABLET | ORAL | Status: AC
  Filled 2013-03-28: qty 1

## 2013-03-28 MED ORDER — BORTEZOMIB CHEMO SQ INJECTION 3.5 MG (2.5MG/ML)
1.3000 mg/m2 | Freq: Once | INTRAMUSCULAR | Status: AC
  Administered 2013-03-28: 2.25 mg via SUBCUTANEOUS
  Filled 2013-03-28: qty 2.25

## 2013-03-28 MED ORDER — ONDANSETRON HCL 8 MG PO TABS
8.0000 mg | ORAL_TABLET | Freq: Once | ORAL | Status: AC
  Administered 2013-03-28: 8 mg via ORAL

## 2013-03-28 NOTE — Patient Instructions (Signed)
[facility] Discharge Instructions for Patients Receiving Chemotherapy  Today you received the following chemotherapy agents: Velcade.  To help prevent nausea and vomiting after your treatment, we encourage you to take your nausea medication as prescribed.   If you develop nausea and vomiting that is not controlled by your nausea medication, call the clinic.   BELOW ARE SYMPTOMS THAT SHOULD BE REPORTED IMMEDIATELY:  *FEVER GREATER THAN 100.5 F  *CHILLS WITH OR WITHOUT FEVER  NAUSEA AND VOMITING THAT IS NOT CONTROLLED WITH YOUR NAUSEA MEDICATION  *UNUSUAL SHORTNESS OF BREATH  *UNUSUAL BRUISING OR BLEEDING  TENDERNESS IN MOUTH AND THROAT WITH OR WITHOUT PRESENCE OF ULCERS  *URINARY PROBLEMS  *BOWEL PROBLEMS  UNUSUAL RASH Items with * indicate a potential emergency and should be followed up as soon as possible.  Feel free to call the clinic you have any questions or concerns. The clinic phone number is [phone].    

## 2013-03-29 ENCOUNTER — Telehealth: Payer: Self-pay | Admitting: *Deleted

## 2013-03-29 NOTE — Telephone Encounter (Signed)
Per scheduler I have adjusted 12/1 appt

## 2013-04-04 ENCOUNTER — Encounter (INDEPENDENT_AMBULATORY_CARE_PROVIDER_SITE_OTHER): Payer: Self-pay

## 2013-04-04 ENCOUNTER — Ambulatory Visit (HOSPITAL_BASED_OUTPATIENT_CLINIC_OR_DEPARTMENT_OTHER): Payer: Medicare Other

## 2013-04-04 ENCOUNTER — Other Ambulatory Visit (HOSPITAL_BASED_OUTPATIENT_CLINIC_OR_DEPARTMENT_OTHER): Payer: Medicare Other | Admitting: Lab

## 2013-04-04 VITALS — BP 138/83 | HR 109 | Temp 98.1°F | Resp 18

## 2013-04-04 DIAGNOSIS — C9000 Multiple myeloma not having achieved remission: Secondary | ICD-10-CM

## 2013-04-04 DIAGNOSIS — Z5112 Encounter for antineoplastic immunotherapy: Secondary | ICD-10-CM

## 2013-04-04 LAB — CBC WITH DIFFERENTIAL/PLATELET
BASO%: 0.5 % (ref 0.0–2.0)
EOS%: 3.5 % (ref 0.0–7.0)
HCT: 41.4 % (ref 34.8–46.6)
MCH: 33.2 pg (ref 25.1–34.0)
MCHC: 32.5 g/dL (ref 31.5–36.0)
MCV: 102.2 fL — ABNORMAL HIGH (ref 79.5–101.0)
MONO%: 14.5 % — ABNORMAL HIGH (ref 0.0–14.0)
NEUT%: 63.3 % (ref 38.4–76.8)
lymph#: 1.2 10*3/uL (ref 0.9–3.3)

## 2013-04-04 LAB — COMPREHENSIVE METABOLIC PANEL (CC13)
ALT: 16 U/L (ref 0–55)
AST: 23 U/L (ref 5–34)
Alkaline Phosphatase: 53 U/L (ref 40–150)
BUN: 6.6 mg/dL — ABNORMAL LOW (ref 7.0–26.0)
Calcium: 9.4 mg/dL (ref 8.4–10.4)
Chloride: 109 mEq/L (ref 98–109)
Creatinine: 0.7 mg/dL (ref 0.6–1.1)
Sodium: 143 mEq/L (ref 136–145)
Total Bilirubin: 0.39 mg/dL (ref 0.20–1.20)
Total Protein: 5.3 g/dL — ABNORMAL LOW (ref 6.4–8.3)

## 2013-04-04 MED ORDER — ONDANSETRON HCL 8 MG PO TABS
ORAL_TABLET | ORAL | Status: AC
  Filled 2013-04-04: qty 1

## 2013-04-04 MED ORDER — BORTEZOMIB CHEMO SQ INJECTION 3.5 MG (2.5MG/ML)
1.3000 mg/m2 | Freq: Once | INTRAMUSCULAR | Status: AC
  Administered 2013-04-04: 2.25 mg via SUBCUTANEOUS
  Filled 2013-04-04: qty 2.25

## 2013-04-04 MED ORDER — ONDANSETRON HCL 8 MG PO TABS
8.0000 mg | ORAL_TABLET | Freq: Once | ORAL | Status: AC
  Administered 2013-04-04: 8 mg via ORAL

## 2013-04-04 NOTE — Patient Instructions (Signed)
[facility] Discharge Instructions for Patients Receiving Chemotherapy  Today you received the following chemotherapy agents: Velcade.  To help prevent nausea and vomiting after your treatment, we encourage you to take your nausea medication as prescribed.   If you develop nausea and vomiting that is not controlled by your nausea medication, call the clinic.   BELOW ARE SYMPTOMS THAT SHOULD BE REPORTED IMMEDIATELY:  *FEVER GREATER THAN 100.5 F  *CHILLS WITH OR WITHOUT FEVER  NAUSEA AND VOMITING THAT IS NOT CONTROLLED WITH YOUR NAUSEA MEDICATION  *UNUSUAL SHORTNESS OF BREATH  *UNUSUAL BRUISING OR BLEEDING  TENDERNESS IN MOUTH AND THROAT WITH OR WITHOUT PRESENCE OF ULCERS  *URINARY PROBLEMS  *BOWEL PROBLEMS  UNUSUAL RASH Items with * indicate a potential emergency and should be followed up as soon as possible.  Feel free to call the clinic you have any questions or concerns. The clinic phone number is [phone].    

## 2013-04-11 ENCOUNTER — Ambulatory Visit: Payer: Medicare Other

## 2013-04-11 ENCOUNTER — Other Ambulatory Visit (HOSPITAL_BASED_OUTPATIENT_CLINIC_OR_DEPARTMENT_OTHER): Payer: Medicare Other | Admitting: Lab

## 2013-04-11 ENCOUNTER — Telehealth: Payer: Self-pay

## 2013-04-11 ENCOUNTER — Other Ambulatory Visit: Payer: Self-pay

## 2013-04-11 ENCOUNTER — Ambulatory Visit (HOSPITAL_BASED_OUTPATIENT_CLINIC_OR_DEPARTMENT_OTHER): Payer: Medicare Other | Admitting: Internal Medicine

## 2013-04-11 VITALS — BP 125/71 | HR 102 | Temp 98.3°F | Resp 18 | Ht 64.0 in | Wt 142.8 lb

## 2013-04-11 DIAGNOSIS — I509 Heart failure, unspecified: Secondary | ICD-10-CM

## 2013-04-11 DIAGNOSIS — I2699 Other pulmonary embolism without acute cor pulmonale: Secondary | ICD-10-CM

## 2013-04-11 DIAGNOSIS — E876 Hypokalemia: Secondary | ICD-10-CM

## 2013-04-11 DIAGNOSIS — R5381 Other malaise: Secondary | ICD-10-CM

## 2013-04-11 DIAGNOSIS — C9000 Multiple myeloma not having achieved remission: Secondary | ICD-10-CM

## 2013-04-11 DIAGNOSIS — R197 Diarrhea, unspecified: Secondary | ICD-10-CM

## 2013-04-11 DIAGNOSIS — I2510 Atherosclerotic heart disease of native coronary artery without angina pectoris: Secondary | ICD-10-CM

## 2013-04-11 DIAGNOSIS — G8929 Other chronic pain: Secondary | ICD-10-CM

## 2013-04-11 DIAGNOSIS — M549 Dorsalgia, unspecified: Secondary | ICD-10-CM

## 2013-04-11 LAB — CBC WITH DIFFERENTIAL/PLATELET
Basophils Absolute: 0.1 10*3/uL (ref 0.0–0.1)
EOS%: 3.6 % (ref 0.0–7.0)
Eosinophils Absolute: 0.2 10*3/uL (ref 0.0–0.5)
HCT: 39.9 % (ref 34.8–46.6)
HGB: 12.8 g/dL (ref 11.6–15.9)
LYMPH%: 13.7 % — ABNORMAL LOW (ref 14.0–49.7)
MCH: 33.6 pg (ref 25.1–34.0)
MCV: 104.6 fL — ABNORMAL HIGH (ref 79.5–101.0)
MONO%: 12.9 % (ref 0.0–14.0)
NEUT#: 4.8 10*3/uL (ref 1.5–6.5)
NEUT%: 69 % (ref 38.4–76.8)
Platelets: 164 10*3/uL (ref 145–400)

## 2013-04-11 LAB — COMPREHENSIVE METABOLIC PANEL (CC13)
ALT: 15 U/L (ref 0–55)
AST: 23 U/L (ref 5–34)
Anion Gap: 7 mEq/L (ref 3–11)
CO2: 28 mEq/L (ref 22–29)
Creatinine: 0.8 mg/dL (ref 0.6–1.1)
Potassium: 4.3 mEq/L (ref 3.5–5.1)
Sodium: 144 mEq/L (ref 136–145)
Total Bilirubin: 0.47 mg/dL (ref 0.20–1.20)
Total Protein: 5.1 g/dL — ABNORMAL LOW (ref 6.4–8.3)

## 2013-04-11 MED ORDER — HYDROCODONE-ACETAMINOPHEN 10-325 MG PO TABS: 1.0000 | ORAL_TABLET | Freq: Three times a day (TID) | ORAL | Status: DC | PRN

## 2013-04-11 NOTE — Patient Instructions (Addendum)
1.  We will stop velcade today.  2. We will start pomalyst (based on prior use of revlidmid and velcade). 3. Continue lovenox shots for now. 4. Prescription for hydrocodone 10mg /325 mg provided. 5. Labs weekly on 12/15, 12/22 6. Return to clinic on 12/29   Pomalidomide oral capsules What is this medicine? POMALIDOMIDE (pom a LID oh mide) is a chemotherapy drug used to treat multiple myeloma. It targets specific proteins within cancer cells and stops the cancer cell from growing. This medicine may be used for other purposes; ask your health care provider or pharmacist if you have questions. COMMON BRAND NAME(S): POMALYST What should I tell my health care provider before I take this medicine? They need to know if you have any of these conditions: -history of blood clots -irregular monthly periods or menstrual cycles -an unusual or allergic reaction to pomalidomide, other medicines, foods, dyes, or preservatives -pregnant or trying to get pregnant -breast-feeding How should I use this medicine? Take this medicine by mouth with a glass of water. Follow the directions on the prescription label. Take this medicine on an empty stomach, at least 2 hours before or 2 hours after a meal. Do not take with food. Do not cut, crush, or chew this medicine. Take your medicine at regular intervals. Do not take it more often than directed. Do not stop taking except on your doctor's advice. A special MedGuide will be given to you by the pharmacist with each prescription and refill. Be sure to read this information carefully each time. Talk to your pediatrician regarding the use of this medicine in children. Special care may be needed. Overdosage: If you think you've taken too much of this medicine contact a poison control center or emergency room at once. Overdosage: If you think you have taken too much of this medicine contact a poison control center or emergency room at once. NOTE: This medicine is only for  you. Do not share this medicine with others. What if I miss a dose? If you miss a dose, take it as soon as you can. If your next dose is to be taken in less than 12 hours, then do not take the missed dose. Take the next dose at your regular time. Do not take double or extra doses. What may interact with this medicine? This medicine may interact with the following medications: -amprenavir -boceprevir -carbamazepine -dalfopristin; quinupristin -delavirdine -enzalutamide -fosamprenavir -indinavir -isoniazid, INH -itraconazole -ketoconazole -nicardipine -rifampin -ritonavir -St. John's Wort, Hypericum perforatum -telaprevir -telithromycin -thiabendazole -tipranavir -tobacco (cigarettes) This list may not describe all possible interactions. Give your health care provider a list of all the medicines, herbs, non-prescription drugs, or dietary supplements you use. Also tell them if you smoke, drink alcohol, or use illegal drugs. Some items may interact with your medicine. What should I watch for while using this medicine? Visit your doctor for regular check ups. Tell your doctor or healthcare professional if your symptoms do not start to get better or if they get worse. You will need to have important blood work done while you are taking this medicine. This medicine is available only through a special program. Doctors, pharmacies, and patients must meet all of the conditions of the program. Your health care provider will help you get signed up with the program if you need this medicine. Through the program you will only receive up to a 28 day supply of the medicine at one time. You will need a new prescription for each refill. This medicine can  cause birth defects. Do not get pregnant while taking this drug. Females with child-bearing potential will need to have 2 negative pregnancy tests before starting this medicine. Pregnancy testing must be done every 2 to 4 weeks as directed while taking  this medicine. Use 2 reliable forms of birth control together while you are taking this medicine and for 1 month after you stop taking this medicine. If you think that you might be pregnant talk to your doctor right away. Men must use a latex condom during sexual contact with a woman while taking this medicine and for 28 days after you stop taking this medicine. A latex condom is needed even if you have had a vasectomy. Contact your doctor right away if your partner becomes pregnant. Do not donate sperm while taking this medicine and for 28 days after you stop taking this medicine. Do not give blood while taking the medicine and for 1 month after completion of treatment to avoid exposing pregnant women to the medicine through the donated blood. You may need blood work done while you are taking this medicine. If you are going to have surgery or any other procedures, tell your doctor you are taking this medicine. What side effects may I notice from receiving this medicine? Side effects that you should report to your doctor or health care professional as soon as possible: -allergic reactions like skin rash, itching or hives, swelling of the face, lips, or tongue -bloody or dark, tarry stools -breathing problems -chest pain -confusion -dark urine -fever, infection, runny nose, or sore throat -nausea, vomiting -pain, tingling, numbness in the hands or feet -red spots on the skin -swelling of your hands, ankles or leg -trouble passing urine or change in the amount of urine -unusual bleeding or bruising  Side effects that usually do not require medical attention (Report these to your doctor or health care professional if they continue or are bothersome.): -constipation -diarrhea -dizziness -headache -joint pain -muscle pain -tiredness -trouble sleeping This list may not describe all possible side effects. Call your doctor for medical advice about side effects. You may report side effects to FDA  at 1-800-FDA-1088. Where should I keep my medicine? Keep out of the reach of children. Store between 20 and 25 degrees C (68 and 77 degrees F). Throw away any unused medicine after the expiration date. NOTE: This sheet is a summary. It may not cover all possible information. If you have questions about this medicine, talk to your doctor, pharmacist, or health care provider.  2014, Elsevier/Gold Standard. (2011-08-14 05:58:16)

## 2013-04-11 NOTE — Telephone Encounter (Signed)
appts made and printed...td 

## 2013-04-11 NOTE — Telephone Encounter (Signed)
Faxed Pomalyst [facility] Patient-Physician Agreement Form to [employer].

## 2013-04-12 ENCOUNTER — Encounter: Payer: Self-pay | Admitting: Internal Medicine

## 2013-04-12 ENCOUNTER — Other Ambulatory Visit: Payer: Self-pay

## 2013-04-12 DIAGNOSIS — C9000 Multiple myeloma not having achieved remission: Secondary | ICD-10-CM

## 2013-04-12 LAB — KAPPA/LAMBDA LIGHT CHAINS: Kappa:Lambda Ratio: 223.2 — ABNORMAL HIGH (ref 0.26–1.65)

## 2013-04-12 MED ORDER — POMALIDOMIDE 2 MG PO CAPS: 2.0000 mg | ORAL_CAPSULE | Freq: Every day | ORAL | Status: DC

## 2013-04-12 NOTE — Progress Notes (Signed)
[facility] OFFICE PROGRESS NOTE  Lindsey Penna, MD [address]. [place] [address]  DIAGNOSIS: Multiple myeloma - Plan: HYDROcodone-acetaminophen (NORCO) 10-325 MG per tablet, CBC with Differential, CBC with Differential, CBC with Differential, Comprehensive metabolic panel, TSH  Chief Complaint  Patient presents with  . Multiple Myeloma   PAST THERAPY: -Zometa 3 mg about every 3 months from 09/15/2008 through 11/01/2010. 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  Revlimid 10 mg daily 3 weeks out of every 4 weeks. Revlimid was started in June 2010 but discontinued due to development of PE.   CURRENT THERAPY: Velcade 1.3 mg per meter squared  subcutaneously weekly, decadron 8 mg PO weekly (Vd) discontinued on 04/04/2013 due to progression. Will start Pomalyst 4 mg daily for 21 days on and then 7 days off and decadron 8 mg PO weekly.  INTERVAL HISTORY: Lindsey Dalton 79 y.o. female with a history of multiple co-morbidities as noted below, including kappa light chain multiple myeloma is here for follow-up.  She was last seen by me on  on 03/16/13.    She complains of fatigue especially following receipt of her velcade.  Otherwise, she denies bone pain or fevers/chills or acute shortness of breath. She gives herself Lovenox injections in the abdomen alternating with her anterior lateral thigh region for these injections, allowing the abdomen to heal. She states that the bruising is significantly better on her abdomen although she is experiencing some bruising on her thighs it is not to the extent that she was having on her abdomen. Her complaints of fatigue have remained stable with no increasing fatigue. She is now taking the acyclovir as prescribed.  She denied nausea, vomiting, diarrhea or constipation.  She is accompanied by her daughter.   MEDICAL HISTORY: Past Medical History  Diagnosis Date  .  CHF (congestive heart failure)   . Diverticulosis   . Lower GI bleed   . Hiatal hernia   . Hypercalcemia   . Osteoporosis with fracture   . CAD (coronary artery disease)   . HTN (hypertension)   . DJD (degenerative joint disease)   . Dyslipidemia   . Multiple myeloma 04/25/2011  . Vitamin D deficiency 04/25/2011  . Anemia     INTERIM HISTORY: has Multiple myeloma; Vitamin d deficiency; Diarrhea; Loss of weight; Dyspnea; and Acute pulmonary embolism on her problem list.    ALLERGIES:  has No Known Allergies.  MEDICATIONS: has a current medication list which includes the following prescription(s): acyclovir, allopurinol, attapulgite, calcium citrate-vitamin d, cholecalciferol, citalopram, dexamethasone, diphenoxylate-atropine, enoxaparin, ferrous gluconate, furosemide, guaifenesin, hydrocodone-acetaminophen, loperamide hcl, metoprolol succinate, nystatin, ondansetron, potassium chloride, promethazine, promethazine, ranitidine, simvastatin, and temazepam.  SURGICAL HISTORY:  Past Surgical History  Procedure Laterality Date  . Fixation kyphoplasty      T11, T12, L1  . Total abdominal hysterectomy    . Esophagogastroduodenoscopy  03/01/2012    Procedure: ESOPHAGOGASTRODUODENOSCOPY (EGD);  Surgeon: Hart Carwin, MD;  Location: [facility];  Service: Endoscopy;  Laterality: N/A;  . Flexible sigmoidoscopy  03/01/2012    Procedure: FLEXIBLE SIGMOIDOSCOPY;  Surgeon: Hart Carwin, MD;  Location: [facility];  Service: Endoscopy;  Laterality: N/A;   PROBLEM LIST:  1. Kappa light chain multiple myeloma diagnosed in April 2010 with 51% plasma cells in the marrow. Kappa light chains evident on serum and urine immunofixation electrophoresis and 8.3 gm of protein in the urine. FISH studies showed 13q minus and 11:14 translocation indicative of a  poor prognosis. The patient initially was treated with Velcade and Decadron from mid-May 2010 through mid-May 2011 with an excellent response. She also  received Zometa from 09/15/08 to 11/01/10. The patient also has been receiving Revlimid since June 2010 and has continued on Revlimid and Decadron since that time in a program as described above. She has had an excellent response to treatment. Last metastatic bone survey was on 12/24/2011 and showed no significant changes when compared with the prior study of 07/20/2011. There were a few lytic lesions, but no evidence for progression. The patient's last 24-hour urine collection was on 12/24/2011 and revealed 58 mg of protein. The UIFE was negative for monoclonal light chains. We have not repeated the bone marrow since the initial bone marrow of April 2010 which showed 51% plasma cells.  2. History of congestive heart failure with coronary artery disease status post cardiac catheterizations in 2009.  3. History of diverticulosis and lower GI bleed in May 2010.  4. History of multiple compression fractures with balloon kyphoplasty involving T11, T12, L1, on 08/17/2008.  5. Hypertension.  6. Dyslipidemia.  7. Hiatal hernia.  8. Severe degenerative joint disease.  9. Vitamin D deficiency.  10. 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  11. Chronic diarrhea occurring 5-6 times a day, 3-4 days a week, associated with some incontinence. The patient states that diarrhea has been ongoing since the time of her myeloma diagnosis which actually dates back 3 years. Apparently, the symptom is getting worse.  12. Right-sided Port-A-Cath placed on 04/09/2009.   REVIEW OF SYSTEMS:   Constitutional: Denies fevers, chills or abnormal weight loss Eyes: Denies blurriness of vision Ears, nose, mouth, throat, and face: Denies mucositis or sore throat Respiratory: Denies cough, dyspnea or wheezes Cardiovascular: Denies palpitation, chest discomfort or lower extremity swelling Gastrointestinal:  Denies nausea, heartburn or change in bowel habits Skin: Denies abnormal skin  rashes Lymphatics: Denies new lymphadenopathy or easy bruising Neurological:Denies numbness, tingling or new weaknesses Behavioral/Psych: Mood is stable, no new changes  All other systems were reviewed with the patient and are negative.  PHYSICAL EXAMINATION: ECOG PERFORMANCE STATUS: 0 - Asymptomatic  Blood pressure 125/71, pulse 102, temperature 98.3 F (36.8 C), temperature source Oral, resp. rate 18, height 5\' 4"  (1.626 m), weight 142 lb 12.8 oz (64.774 kg).  GENERAL:alert, no distress and comfortable; mildly overweight.   SKIN: skin color, texture, turgor are normal, no rashes or significant lesions EYES: normal, Conjunctiva are pink and non-injected, sclera clear OROPHARYNX:no exudate, no erythema and lips, buccal mucosa, and tongue normal  NECK: supple, thyroid normal size, non-tender, without nodularity LYMPH:  no palpable lymphadenopathy in the cervical, axillary or supraclavicular LUNGS: clear to auscultation and percussion with normal breathing effort HEART: Tachycardia and no murmurs and traceextremity edema ABDOMEN:abdomen soft, non-tender and normal bowel sounds; Right and left  lower abdomen reveals ecchymosis of varying coloration with one firm nodule  Musculoskeletal:no cyanosis of digits and no clubbing ; + LE tenderness with trace edema.  RLE with mild erythrema  Covered by a bandage. NEURO: alert & oriented x 3 with fluent speech, no focal motor/sensory deficits  LABORATORY DATA: Results for orders placed in visit on 04/11/13 (from the past 48 hour(s))  CBC WITH DIFFERENTIAL     Status: Abnormal   Collection Time    04/11/13 10:51 AM      Result Value Range   WBC 6.9  3.9 - 10.3 10e3/uL   NEUT# 4.8  1.5 - 6.5  10e3/uL   HGB 12.8  11.6 - 15.9 g/dL   HCT 16.1  09.6 - 04.5 %   Platelets 164  145 - 400 10e3/uL   MCV 104.6 (*) 79.5 - 101.0 fL   MCH 33.6  25.1 - 34.0 pg   MCHC 32.2  31.5 - 36.0 g/dL   RBC 4.09  8.11 - 9.14 10e6/uL   RDW 15.2 (*) 11.2 - 14.5 %    lymph# 0.9  0.9 - 3.3 10e3/uL   MONO# 0.9  0.1 - 0.9 10e3/uL   Eosinophils Absolute 0.2  0.0 - 0.5 10e3/uL   Basophils Absolute 0.1  0.0 - 0.1 10e3/uL   NEUT% 69.0  38.4 - 76.8 %   LYMPH% 13.7 (*) 14.0 - 49.7 %   MONO% 12.9  0.0 - 14.0 %   EOS% 3.6  0.0 - 7.0 %   BASO% 0.8  0.0 - 2.0 %  COMPREHENSIVE METABOLIC PANEL (CC13)     Status: Abnormal   Collection Time    04/11/13 10:51 AM      Result Value Range   Sodium 144  136 - 145 mEq/L   Potassium 4.3  3.5 - 5.1 mEq/L   Chloride 110 (*) 98 - 109 mEq/L   CO2 28  22 - 29 mEq/L   Glucose 109  70 - 140 mg/dl   BUN 6.6 (*) 7.0 - 78.2 mg/dL   Creatinine 0.8  0.6 - 1.1 mg/dL   Total Bilirubin 9.56  0.20 - 1.20 mg/dL   Alkaline Phosphatase 48  40 - 150 U/L   AST 23  5 - 34 U/L   ALT 15  0 - 55 U/L   Total Protein 5.1 (*) 6.4 - 8.3 g/dL   Albumin 3.1 (*) 3.5 - 5.0 g/dL   Calcium 9.6  8.4 - 21.3 mg/dL   Anion Gap 7  3 - 11 mEq/L    Labs:  Lab Results  Component Value Date   WBC 6.9 04/11/2013   HGB 12.8 04/11/2013   HCT 39.9 04/11/2013   MCV 104.6* 04/11/2013   PLT 164 04/11/2013   NEUTROABS 4.8 04/11/2013      Chemistry      Component Value Date/Time   NA 144 04/11/2013 1051   NA 139 10/14/2012 1501   K 4.3 04/11/2013 1051   K 2.8* 10/14/2012 1501   CL 103 10/14/2012 1501   CL 108* 06/21/2012 1317   CO2 28 04/11/2013 1051   CO2 26 10/14/2012 1501   BUN 6.6* 04/11/2013 1051   BUN 10 10/14/2012 1501   CREATININE 0.8 04/11/2013 1051   CREATININE 1.00 10/14/2012 1501   CREATININE 0.75 08/15/2009 1030      Component Value Date/Time   CALCIUM 9.6 04/11/2013 1051   CALCIUM 9.1 10/14/2012 1501   CALCIUM 10.9* 08/20/2008 0415   ALKPHOS 48 04/11/2013 1051   ALKPHOS 64 10/14/2012 1501   AST 23 04/11/2013 1051   AST 20 10/14/2012 1501   ALT 15 04/11/2013 1051   ALT 18 10/14/2012 1501   BILITOT 0.47 04/11/2013 1051   BILITOT 0.4 10/14/2012 1501     Basic Metabolic Panel:  Recent Labs Lab 04/11/13 1051  NA 144  K 4.3  CO2 28  GLUCOSE 109  BUN 6.6*   CREATININE 0.8  CALCIUM 9.6   GFR Estimated Creatinine Clearance: 42.8 ml/min (by C-G formula based on Cr of 0.8). Liver Function Tests:  Recent Labs Lab 04/11/13 1051  AST 23  ALT 15  ALKPHOS  48  BILITOT 0.47  PROT 5.1*  ALBUMIN 3.1*   CBC:  Recent Labs Lab 04/11/13 1051  WBC 6.9  NEUTROABS 4.8  HGB 12.8  HCT 39.9  MCV 104.6*  PLT 164   Results for Lindsey Dalton, Lindsey Dalton (MRN [id number]) as of 02/23/2013 19:48  Ref. Range 10/14/2012 14:04 12/13/2012 14:23 02/22/2013 14:55  Kappa:Lambda Ratio Latest Range: 0.26-1.65  148.00 (H) 62.61 (H) 49.76 (H)   Results for Lindsey Dalton, Lindsey Dalton (MRN [id number]) as of 02/23/2013 19:48  Ref. Range 10/14/2012 14:04 12/13/2012 14:23 02/22/2013 14:55  Kappa free light chain Latest Range: 0.33-1.94 mg/dL 78.29 (H) 56.21 (H) 30.86 (H)   Results for Lindsey Dalton, Lindsey Dalton (MRN [id number]) as of 03/16/2013 04:44  Ref. Range 10/19/2012 13:32 12/16/2012 14:27 03/07/2013 15:11  Time-UPE24 No range found 24 24 24   Volume, Urine-UPE24 No range found 1400 1600 950  Total Protein, Urine-UPE24 No range found 26.3 35.6 67.1  Total Protein, Urine-Ur/day Latest Range: 10-140 mg/day 368 (H) 570 (H) 637 (H)  ALBUMIN, U Latest Range: DETECTED  DETECTED DETECTED DETECTED  Alpha 1, Urine Latest Range: NONE DET  DETECTED (A) DETECTED (A) DETECTED (A)  Alpha 2, Urine Latest Range: NONE DET  DETECTED (A) DETECTED (A) DETECTED (A)  Beta, Urine Latest Range: NONE DET  DETECTED (A) DETECTED (A) DETECTED (A)  Gamma Globulin, Urine Latest Range: NONE DET  DETECTED (A) DETECTED (A) DETECTED (A)  Free Kappa Lt Chains,Ur Latest Range: 0.14-2.42 mg/dL 52.84 (H) 13.24 (H) 40.10 (H)  Free Lt Chn Excr Rate No range found 359.80 561.60 632.70  Free Lambda Lt Chains,Ur Latest Range: 0.02-0.67 mg/dL 2.72 5.36 6.44  Free Lambda Excretion/Day No range found 0.84 0.48 0.38  Free Kappa/Lambda Ratio Latest Range: 2.04-10.37 ratio 428.33 (H) 1170.00 (H) 1665.00 (H)    RADIOGRAPHIC  STUDIES: 1. Metastatic bone survey carried out on 07/20/2009 showed osteopenia. There were some small lucent lesions within the skull consistent with a either venous lakes or multiple myeloma deposits. There were numerous wedge compression fractures and degenerative changes throughout the spine. There was a wedge compression of L3 of indeterminate age.  2. Chest x-ray, 2-view, from 07/25/2010 showed no active cardiopulmonary disease.  3. Metastatic bone survey from 09/05/2010 showed unchanged osseous survey with a few lytic lesions that were seen in the skull, possibly the right femur at the junction of the middle and distal thirds. There were compression fractures of the thoracic and lumbar spine unchanged. The patient is status post vertebroplasty at T11, T12, and L1. A small lytic lesion was seen in the scapula. There was a degenerative cyst in the greater tuberosity of the left humerus.  4. Metastatic bone survey on 07/28/2011 showed minimal progression of small lytic lesions in the skull compatible with a history of multiple myeloma. There was no significant change in the small lytic lesions in left scapula. There was probably better visualization of small lytic lesions in the proximal left femur and poor visualization of a small lytic lesion in the distal right femur. There was stable diffuse osteopenia and multiple vertebral compression fractures.  5. Metastatic bone survey from 12/24/2011 showed multiple compression fractures in the spine which are stable. There were tiny lytic lesions seen in the humeri and in the left clavicle as well as the left scapula. These were felt to be stable. No impending  pathologic fractures.  6. Chest x-ray, 2 view, from 12/24/2011 showed no acute disease.  7. Bone density scan from 10/13/2012 showed a T-score for the lumbar spine  total of -0.5. T-score of the right femoral neck was -2.8 and the T- score for the left femoral neck was -3.2. 8. Metastatic bone survey  from 03/01/2013 showed stable myeloma lesions observed in the skull, left clavicle, left scapula, and right humerus. No new lesion identified and multiple old compression fractures in the spine, with multilevel vertebral augmentation.  ASSESSMENT: Lindsey Dalton 79 y.o. female with a history of Multiple myeloma - Plan: HYDROcodone-acetaminophen (NORCO) 10-325 MG per tablet, CBC with Differential, CBC with Differential, CBC with Differential, Comprehensive metabolic panel, TSH  PLAN:  1. Kappa light chain multiple myeloma. --Her disease was complicated by a history of multiple compression fractures with balloon kyphoplasty involving T11, T12, L1, on 08/17/2008. She is 13q minus and 11:14 translocation indicative of a poor prognosis. Durie-Salmon Stage III based on advanced lesions.   --We will obtain kappa/lambda light chains, CBC, CMP and urine protein studies.  Her creatinine and hemogloblin is stable which is reassuring.  In addition, her kappa/lambda ratio has decreased to 49.76 down from 62.61 and 148 in June of this year.  Her free kappa/lambda urine ratio had increase from June to August from 428.33 to 1170 respectively.  Her skeletal survey is stable.  Her urine studies are concerning for progression.  We can use pomalyst in treatment of multiple myeloma in patients who have received at least 2 prior therapies (including lenalidomide and bortezomib) and have demonstrated disease progression on or within 60 days of completion of the last therapy. We will stop Velcade and change to pomalyst 4 mg daily for 21 days, then off for 7 days in combination with her dexamethasone. She will continue on anticoagulation to reduce her risk of clots.   The benefit of treatment including controlling her multiple myeloma and risks were discussed including but not limited to bone marrow suppression resulting in life threatening infections, anemia and fatigue, diarrhea, second malignancies and neuropathy.    2.  Pulmonary Embolism (10/21/2012). --She was on revlimid at this time.  It has since been discontinued. PE is found within the right middle lobar pulmonary artery extending the segmental middle lobar branches, and a few scattered subsegmental arteries in the bilateral lower lobes.   --Continue lovenox 100 mg [place] daily.  Planning to treat for 6 months from day of her diagnosis.  May consider transitioning back to coumadin for her convience.  If so, will need a referral to A/C clinic.  She stated xarelto was not affordable for her.   --Will start pomalyst as noted above and can also cause clots.  We will reduce this by continuing her treatment with Lovenox.    3. Subcutaneous bruising due to injections. --Patient reports continued intermittent bruising due to her lovenox injections.   We will monitor closely for worsening.   4. Chronic diarrhea with hypokalemia.  --this is likely secondary to her treatment for #1. Continue immodium as needed. On Potassium chloride 10 MEQ bid.  5. CHF with CAD, Tachycardia. -- On lasix 40 mg daily.   Monitor creatinine and electrolytes. --Patient counseled to call MD with any symptoms of chest pain.   Tachycardia is commonly of symptom of PE.  We will monitor closely if persists or worsens, will check EKG and refer to cardiology.   6. Chronic back pain secondary to #1.  --Hydrocodone-acetaminophen 10-325 mg q 8 hours (prescription provided today #90).  7. Follow-up  --Patient instructed to follow up in 2 weeks with CBCs weekly for the first 8 weeks on Pomalyst.  All questions were answered. The patient knows to call the clinic with any problems, questions or concerns. We can certainly see the patient much sooner if necessary.  I spent 15 minutes counseling the patient face to face. The total time spent in the appointment was 25 minutes.    Tauri Ethington, MD 04/12/2013 8:33 AM

## 2013-04-12 NOTE — Progress Notes (Signed)
Faxed pomalyst prescription to [facility]

## 2013-04-14 ENCOUNTER — Telehealth: Payer: Self-pay

## 2013-04-14 NOTE — Telephone Encounter (Signed)
Helmut Muster from [employer] called to say pt is approved for [employer] for 1 year

## 2013-04-15 ENCOUNTER — Telehealth: Payer: Self-pay | Admitting: Dietician

## 2013-04-15 NOTE — Telephone Encounter (Signed)
Brief Outpatient Oncology Nutrition Note  Patient has been identified to be at risk on malnutrition screen.  Wt Readings from Last 10 Encounters:  04/11/13 142 lb 12.8 oz (64.774 kg)  03/14/13 148 lb 8 oz (67.359 kg)  02/22/13 146 lb 6.4 oz (66.407 kg)  01/31/13 150 lb 14.4 oz (68.448 kg)  01/11/13 151 lb 9.6 oz (68.765 kg)  12/20/12 155 lb 4.8 oz (70.444 kg)  12/13/12 156 lb 1.6 oz (70.806 kg)  11/25/12 152 lb 8 oz (69.174 kg)  10/14/12 149 lb 1.6 oz (67.631 kg)  06/21/12 156 lb 11.2 oz (71.079 kg)   Dx:  Multiple Myeloma  Called patient duet ot continued weight loss.  Patient reports poor appetite and intake.  Takes a little food with meds.  Encouraged 3 meals plus snacks including Ensure bid daily.  Will mail coupons for Ensure and contact information for [facility].  Oran Rein, RD, LDN

## 2013-04-19 NOTE — Telephone Encounter (Signed)
RECEIVED A FAX FROM [facility] CONCERNING A CONFIRMATION OF PRESCRIPTION SHIPMENT FOR POMALYST ON 04/18/13.

## 2013-04-22 ENCOUNTER — Other Ambulatory Visit: Payer: Self-pay

## 2013-04-22 DIAGNOSIS — C9000 Multiple myeloma not having achieved remission: Secondary | ICD-10-CM

## 2013-04-22 MED ORDER — METHOCARBAMOL 500 MG PO TABS: 500.0000 mg | ORAL_TABLET | Freq: Two times a day (BID) | ORAL | Status: AC | PRN

## 2013-04-22 NOTE — Telephone Encounter (Signed)
Called pt to find out if she was taking her pomalyst. She has been taking for last 2 days. She mentioned she is having what feels like muscle spasms between her ribs. This has been going on for about a week, since before she started pomalyst. She has pain on movement but no pain when laying or sitting still. She states she has had similar in past where MD would give her a pill for a few days. S/w Dr Rosie Fate then called pt back to let her know Dr Rosie Fate will Rx Robaxin. She can take 1 tab BID prn muscle spasms. He will Rx 4 day supply until she to sees Dr Caprice Kluver on [address]. To call us if any dizzyness or lightheadedness.

## 2013-04-25 ENCOUNTER — Other Ambulatory Visit (HOSPITAL_BASED_OUTPATIENT_CLINIC_OR_DEPARTMENT_OTHER): Payer: Medicare Other

## 2013-04-25 DIAGNOSIS — C9000 Multiple myeloma not having achieved remission: Secondary | ICD-10-CM

## 2013-04-25 LAB — COMPREHENSIVE METABOLIC PANEL (CC13)
ALT: 14 U/L (ref 0–55)
AST: 20 U/L (ref 5–34)
Anion Gap: 8 mEq/L (ref 3–11)
BUN: 11.4 mg/dL (ref 7.0–26.0)
CO2: 26 mEq/L (ref 22–29)
Calcium: 9.8 mg/dL (ref 8.4–10.4)
Creatinine: 0.7 mg/dL (ref 0.6–1.1)
Total Bilirubin: 0.35 mg/dL (ref 0.20–1.20)

## 2013-04-25 LAB — CBC WITH DIFFERENTIAL/PLATELET
Basophils Absolute: 0 10*3/uL (ref 0.0–0.1)
EOS%: 4.2 % (ref 0.0–7.0)
Eosinophils Absolute: 0.3 10*3/uL (ref 0.0–0.5)
HCT: 38.9 % (ref 34.8–46.6)
HGB: 13.1 g/dL (ref 11.6–15.9)
LYMPH%: 16.5 % (ref 14.0–49.7)
MCH: 34.5 pg — ABNORMAL HIGH (ref 25.1–34.0)
MCV: 102.6 fL — ABNORMAL HIGH (ref 79.5–101.0)
NEUT#: 4.4 10*3/uL (ref 1.5–6.5)
NEUT%: 69.5 % (ref 38.4–76.8)
Platelets: 183 10*3/uL (ref 145–400)
RDW: 14.6 % — ABNORMAL HIGH (ref 11.2–14.5)
lymph#: 1.1 10*3/uL (ref 0.9–3.3)

## 2013-04-27 ENCOUNTER — Telehealth: Payer: Self-pay | Admitting: Internal Medicine

## 2013-04-27 NOTE — Telephone Encounter (Signed)
I spoke with the patient at 4:50 on 04/27/13.  She started her pomalyst last Tuesday (12/09) and reports no symptoms of diarrhea.  Her labs were discussed from 12/15 and appear to be within normal limits.  She saw her PCP who was concern about restarting her zometa.  She reports problems with bone exposed in her mouth.  We will refer to dentistry prior to re-starting her zometa.  She is on ensure for weight gain.  She is doing well so far on her new therapy.  She is continuing lovenox injections.

## 2013-05-02 ENCOUNTER — Other Ambulatory Visit (HOSPITAL_BASED_OUTPATIENT_CLINIC_OR_DEPARTMENT_OTHER): Payer: Medicare Other

## 2013-05-02 DIAGNOSIS — C9000 Multiple myeloma not having achieved remission: Secondary | ICD-10-CM

## 2013-05-02 LAB — CBC WITH DIFFERENTIAL/PLATELET
BASO%: 0.3 % (ref 0.0–2.0)
Basophils Absolute: 0 10*3/uL (ref 0.0–0.1)
EOS%: 3.6 % (ref 0.0–7.0)
Eosinophils Absolute: 0.3 10*3/uL (ref 0.0–0.5)
HCT: 40.9 % (ref 34.8–46.6)
HGB: 13.5 g/dL (ref 11.6–15.9)
LYMPH%: 17.4 % (ref 14.0–49.7)
MCH: 33.9 pg (ref 25.1–34.0)
MCHC: 33.1 g/dL (ref 31.5–36.0)
MCV: 102.6 fL — ABNORMAL HIGH (ref 79.5–101.0)
MONO%: 25.1 % — ABNORMAL HIGH (ref 0.0–14.0)
NEUT#: 5 10*3/uL (ref 1.5–6.5)
NEUT%: 53.6 % (ref 38.4–76.8)
Platelets: 196 10*3/uL (ref 145–400)
RBC: 3.99 10*6/uL (ref 3.70–5.45)
lymph#: 1.6 10*3/uL (ref 0.9–3.3)

## 2013-05-06 ENCOUNTER — Other Ambulatory Visit: Payer: Self-pay | Admitting: *Deleted

## 2013-05-06 NOTE — Telephone Encounter (Signed)
THIS REFILL REQUEST FOR POMALYST WAS GIVEN TO DR.CHISM'S NURSE, KATHY BUYCK,RN. 

## 2013-05-09 ENCOUNTER — Other Ambulatory Visit: Payer: Self-pay | Admitting: *Deleted

## 2013-05-09 ENCOUNTER — Other Ambulatory Visit (HOSPITAL_BASED_OUTPATIENT_CLINIC_OR_DEPARTMENT_OTHER): Payer: Medicare Other

## 2013-05-09 ENCOUNTER — Telehealth: Payer: Self-pay | Admitting: *Deleted

## 2013-05-09 ENCOUNTER — Ambulatory Visit (HOSPITAL_BASED_OUTPATIENT_CLINIC_OR_DEPARTMENT_OTHER): Payer: Medicare Other

## 2013-05-09 VITALS — BP 119/65 | HR 79 | Temp 97.1°F

## 2013-05-09 DIAGNOSIS — Z452 Encounter for adjustment and management of vascular access device: Secondary | ICD-10-CM

## 2013-05-09 DIAGNOSIS — C9000 Multiple myeloma not having achieved remission: Secondary | ICD-10-CM

## 2013-05-09 DIAGNOSIS — R634 Abnormal weight loss: Secondary | ICD-10-CM

## 2013-05-09 DIAGNOSIS — Z95828 Presence of other vascular implants and grafts: Secondary | ICD-10-CM

## 2013-05-09 DIAGNOSIS — Z5112 Encounter for antineoplastic immunotherapy: Secondary | ICD-10-CM

## 2013-05-09 LAB — CBC WITH DIFFERENTIAL/PLATELET
BASO%: 0.7 % (ref 0.0–2.0)
EOS%: 8.2 % — ABNORMAL HIGH (ref 0.0–7.0)
HCT: 37.5 % (ref 34.8–46.6)
LYMPH%: 25 % (ref 14.0–49.7)
MCH: 33.8 pg (ref 25.1–34.0)
MCHC: 33.1 g/dL (ref 31.5–36.0)
MCV: 102.2 fL — ABNORMAL HIGH (ref 79.5–101.0)
MONO#: 0.8 10*3/uL (ref 0.1–0.9)
MONO%: 21.9 % — ABNORMAL HIGH (ref 0.0–14.0)
NEUT#: 1.7 10*3/uL (ref 1.5–6.5)
NEUT%: 44.2 % (ref 38.4–76.8)
Platelets: 227 10*3/uL (ref 145–400)
RBC: 3.67 10*6/uL — ABNORMAL LOW (ref 3.70–5.45)
WBC: 3.8 10*3/uL — ABNORMAL LOW (ref 3.9–10.3)

## 2013-05-09 LAB — COMPREHENSIVE METABOLIC PANEL (CC13)
ALT: 20 U/L (ref 0–55)
AST: 24 U/L (ref 5–34)
Albumin: 2.7 g/dL — ABNORMAL LOW (ref 3.5–5.0)
Alkaline Phosphatase: 55 U/L (ref 40–150)
Anion Gap: 8 mEq/L (ref 3–11)
CO2: 29 mEq/L (ref 22–29)
Chloride: 108 mEq/L (ref 98–109)
Creatinine: 0.6 mg/dL (ref 0.6–1.1)
Total Bilirubin: 0.32 mg/dL (ref 0.20–1.20)

## 2013-05-09 MED ORDER — SODIUM CHLORIDE 0.9 % IJ SOLN
10.0000 mL | INTRAMUSCULAR | Status: DC | PRN
  Administered 2013-05-09: 10 mL via INTRAVENOUS
  Filled 2013-05-09: qty 10

## 2013-05-09 MED ORDER — POMALIDOMIDE 2 MG PO CAPS: 2.0000 mg | ORAL_CAPSULE | Freq: Every day | ORAL | Status: DC

## 2013-05-09 MED ORDER — HEPARIN SOD (PORK) LOCK FLUSH 100 UNIT/ML IV SOLN
500.0000 [IU] | Freq: Once | INTRAVENOUS | Status: AC
  Administered 2013-05-09: 500 [IU] via INTRAVENOUS
  Filled 2013-05-09: qty 5

## 2013-05-09 NOTE — Telephone Encounter (Signed)
appt made for labs today. Pt is aware...td

## 2013-05-09 NOTE — Patient Instructions (Signed)
Implanted Port Instructions  An implanted port is a central line that has a round shape and is placed under the skin. It is used for long-term IV (intravenous) access for:  · Medicine.  · Fluids.  · Liquid nutrition, such as TPN (total parenteral nutrition).  · Blood samples.  Ports can be placed:  · In the chest area just below the collarbone (this is the most common place.)  · In the arms.  · In the belly (abdomen) area.  · In the legs.  PARTS OF THE PORT  A port has 2 main parts:  · The reservoir. The reservoir is round, disc-shaped, and will be a small, raised area under your skin.  · The reservoir is the part where a needle is inserted (accessed) to either give medicines or to draw blood.  · The catheter. The catheter is a long, slender tube that extends from the reservoir. The catheter is placed into a large vein.  · Medicine that is inserted into the reservoir goes into the catheter and then into the vein.  INSERTION OF THE PORT  · The port is surgically placed in either an operating room or in a procedural area (interventional radiology).  · Medicine may be given to help you relax during the procedure.  · The skin where the port will be inserted is numbed (local anesthetic).  · 1 or 2 small cuts (incisions) will be made in the skin to insert the port.  · The port can be used after it has been inserted.  INCISION SITE CARE  · The incision site may have small adhesive strips on it. This helps keep the incision site closed. Sometimes, no adhesive strips are placed. Instead of adhesive strips, a special kind of surgical glue is used to keep the incision closed.  · If adhesive strips were placed on the incision sites, do not take them off. They will fall off on their own.  · The incision site may be sore for 1 to 2 days. Pain medicine can help.  · Do not get the incision site wet. Bathe or shower as directed by your caregiver.  · The incision site should heal in 5 to 7 days. A small scar may form after the  incision has healed.  ACCESSING THE PORT  Special steps must be taken to access the port:  · Before the port is accessed, a numbing cream can be placed on the skin. This helps numb the skin over the port site.  · A sterile technique is used to access the port.  · The port is accessed with a needle. Only "non-coring" port needles should be used to access the port. Once the port is accessed, a blood return should be checked. This helps ensure the port is in the vein and is not clogged (clotted).  · If your caregiver believes your port should remain accessed, a clear (transparent) bandage will be placed over the needle site. The bandage and needle will need to be changed every week or as directed by your caregiver.  · Keep the bandage covering the needle clean and dry. Do not get it wet. Follow your caregiver's instructions on how to take a shower or bath when the port is accessed.  · If your port does not need to stay accessed, no bandage is needed over the port.  FLUSHING THE PORT  Flushing the port keeps it from getting clogged. How often the port is flushed depends on:  · If a   constant infusion is running. If a constant infusion is running, the port may not need to be flushed.  · If intermittent medicines are given.  · If the port is not being used.  For intermittent medicines:  · The port will need to be flushed:  · After medicines have been given.  · After blood has been drawn.  · As part of routine maintenance.  · A port is normally flushed with:  · Normal saline.  · Heparin.  · Follow your caregiver's advice on how often, how much, and the type of flush to use on your port.  IMPORTANT PORT INFORMATION  · Tell your caregiver if you are allergic to heparin.  · After your port is placed, you will get a manufacturer's information card. The card has information about your port. Keep this card with you at all times.  · There are many types of ports available. Know what kind of port you have.  · In case of an  emergency, it may be helpful to wear a medical alert bracelet. This can help alert health care workers that you have a port.  · The port can stay in for as long as your caregiver believes it is necessary.  · When it is time for the port to come out, surgery will be done to remove it. The surgery will be similar to how the port was put in.  · If you are in the hospital or clinic:  · Your port will be taken care of and flushed by a nurse.  · If you are at home:  · A home health care nurse may give medicines and take care of the port.  · You or a family member can get special training and directions for giving medicine and taking care of the port at home.  SEEK IMMEDIATE MEDICAL CARE IF:   · Your port does not flush or you are unable to get a blood return.  · New drainage or pus is coming from the incision.  · A bad smell is coming from the incision site.  · You develop swelling or increased redness at the incision site.  · You develop increased swelling or pain at the port site.  · You develop swelling or pain in the surrounding skin near the port.  · You have an oral temperature above 102° F (38.9° C), not controlled by medicine.  MAKE SURE YOU:   · Understand these instructions.  · Will watch your condition.  · Will get help right away if you are not doing well or get worse.  Document Released: 04/28/2005 Document Revised: 07/21/2011 Document Reviewed: 07/20/2008  [employer]® Patient Information ©2014 [employer].

## 2013-05-11 ENCOUNTER — Other Ambulatory Visit: Payer: Self-pay | Admitting: Medical Oncology

## 2013-05-11 DIAGNOSIS — C9000 Multiple myeloma not having achieved remission: Secondary | ICD-10-CM

## 2013-05-11 MED ORDER — HYDROCODONE-ACETAMINOPHEN 10-325 MG PO TABS: 1.0000 | ORAL_TABLET | Freq: Three times a day (TID) | ORAL | Status: DC | PRN

## 2013-05-16 ENCOUNTER — Other Ambulatory Visit (HOSPITAL_BASED_OUTPATIENT_CLINIC_OR_DEPARTMENT_OTHER): Payer: Medicare Other

## 2013-05-16 ENCOUNTER — Telehealth: Payer: Self-pay | Admitting: Internal Medicine

## 2013-05-16 ENCOUNTER — Ambulatory Visit (HOSPITAL_BASED_OUTPATIENT_CLINIC_OR_DEPARTMENT_OTHER): Payer: Medicare Other | Admitting: Internal Medicine

## 2013-05-16 ENCOUNTER — Encounter: Payer: Self-pay | Admitting: Internal Medicine

## 2013-05-16 VITALS — BP 150/92 | HR 101 | Temp 96.9°F | Resp 18 | Ht 64.0 in | Wt 139.7 lb

## 2013-05-16 DIAGNOSIS — R233 Spontaneous ecchymoses: Secondary | ICD-10-CM

## 2013-05-16 DIAGNOSIS — E876 Hypokalemia: Secondary | ICD-10-CM

## 2013-05-16 DIAGNOSIS — G893 Neoplasm related pain (acute) (chronic): Secondary | ICD-10-CM

## 2013-05-16 DIAGNOSIS — C9000 Multiple myeloma not having achieved remission: Secondary | ICD-10-CM

## 2013-05-16 DIAGNOSIS — I509 Heart failure, unspecified: Secondary | ICD-10-CM

## 2013-05-16 DIAGNOSIS — R197 Diarrhea, unspecified: Secondary | ICD-10-CM

## 2013-05-16 DIAGNOSIS — I2699 Other pulmonary embolism without acute cor pulmonale: Secondary | ICD-10-CM

## 2013-05-16 DIAGNOSIS — Z86711 Personal history of pulmonary embolism: Secondary | ICD-10-CM

## 2013-05-16 LAB — CBC WITH DIFFERENTIAL/PLATELET
BASO%: 1.5 % (ref 0.0–2.0)
Basophils Absolute: 0.1 10*3/uL (ref 0.0–0.1)
EOS%: 2.4 % (ref 0.0–7.0)
Eosinophils Absolute: 0.1 10*3/uL (ref 0.0–0.5)
HEMATOCRIT: 41.4 % (ref 34.8–46.6)
HGB: 13.5 g/dL (ref 11.6–15.9)
LYMPH#: 2.1 10*3/uL (ref 0.9–3.3)
LYMPH%: 35.7 % (ref 14.0–49.7)
MCH: 33.3 pg (ref 25.1–34.0)
MCHC: 32.6 g/dL (ref 31.5–36.0)
MCV: 102 fL — ABNORMAL HIGH (ref 79.5–101.0)
MONO#: 1.2 10*3/uL — AB (ref 0.1–0.9)
MONO%: 19.4 % — ABNORMAL HIGH (ref 0.0–14.0)
NEUT#: 2.4 10*3/uL (ref 1.5–6.5)
NEUT%: 41 % (ref 38.4–76.8)
Platelets: 271 10*3/uL (ref 145–400)
RBC: 4.06 10*6/uL (ref 3.70–5.45)
RDW: 14.1 % (ref 11.2–14.5)
WBC: 5.9 10*3/uL (ref 3.9–10.3)
nRBC: 0 % (ref 0–0)

## 2013-05-16 NOTE — Patient Instructions (Signed)
Pomalidomide oral capsules What is this medicine? POMALIDOMIDE (pom a LID oh mide) is a chemotherapy drug used to treat multiple myeloma. It targets specific proteins within cancer cells and stops the cancer cell from growing. This medicine may be used for other purposes; ask your health care provider or pharmacist if you have questions. COMMON BRAND NAME(S): POMALYST What should I tell my health care provider before I take this medicine? They need to know if you have any of these conditions: -history of blood clots -irregular monthly periods or menstrual cycles -an unusual or allergic reaction to pomalidomide, other medicines, foods, dyes, or preservatives -pregnant or trying to get pregnant -breast-feeding How should I use this medicine? Take this medicine by mouth with a glass of water. Follow the directions on the prescription label. Take this medicine on an empty stomach, at least 2 hours before or 2 hours after a meal. Do not take with food. Do not cut, crush, or chew this medicine. Take your medicine at regular intervals. Do not take it more often than directed. Do not stop taking except on your doctor's advice. A special MedGuide will be given to you by the pharmacist with each prescription and refill. Be sure to read this information carefully each time. Talk to your pediatrician regarding the use of this medicine in children. Special care may be needed. Overdosage: If you think you've taken too much of this medicine contact a poison control center or emergency room at once. Overdosage: If you think you have taken too much of this medicine contact a poison control center or emergency room at once. NOTE: This medicine is only for you. Do not share this medicine with others. What if I miss a dose? If you miss a dose, take it as soon as you can. If your next dose is to be taken in less than 12 hours, then do not take the missed dose. Take the next dose at your regular time. Do not take  double or extra doses. What may interact with this medicine? This medicine may interact with the following medications: -amprenavir -boceprevir -carbamazepine -dalfopristin; quinupristin -delavirdine -enzalutamide -fosamprenavir -indinavir -isoniazid, INH -itraconazole -ketoconazole -nicardipine -rifampin -ritonavir -St. John's Wort, Hypericum perforatum -telaprevir -telithromycin -thiabendazole -tipranavir -tobacco (cigarettes) This list may not describe all possible interactions. Give your health care provider a list of all the medicines, herbs, non-prescription drugs, or dietary supplements you use. Also tell them if you smoke, drink alcohol, or use illegal drugs. Some items may interact with your medicine. What should I watch for while using this medicine? Visit your doctor for regular check ups. Tell your doctor or healthcare professional if your symptoms do not start to get better or if they get worse. You will need to have important blood work done while you are taking this medicine. This medicine is available only through a special program. Doctors, pharmacies, and patients must meet all of the conditions of the program. Your health care provider will help you get signed up with the program if you need this medicine. Through the program you will only receive up to a 28 day supply of the medicine at one time. You will need a new prescription for each refill. This medicine can cause birth defects. Do not get pregnant while taking this drug. Females with child-bearing potential will need to have 2 negative pregnancy tests before starting this medicine. Pregnancy testing must be done every 2 to 4 weeks as directed while taking this medicine. Use 2 reliable forms of  birth control together while you are taking this medicine and for 1 month after you stop taking this medicine. If you think that you might be pregnant talk to your doctor right away. Men must use a latex condom during  sexual contact with a woman while taking this medicine and for 28 days after you stop taking this medicine. A latex condom is needed even if you have had a vasectomy. Contact your doctor right away if your partner becomes pregnant. Do not donate sperm while taking this medicine and for 28 days after you stop taking this medicine. Do not give blood while taking the medicine and for 1 month after completion of treatment to avoid exposing pregnant women to the medicine through the donated blood. You may need blood work done while you are taking this medicine. If you are going to have surgery or any other procedures, tell your doctor you are taking this medicine. What side effects may I notice from receiving this medicine? Side effects that you should report to your doctor or health care professional as soon as possible: -allergic reactions like skin rash, itching or hives, swelling of the face, lips, or tongue -bloody or dark, tarry stools -breathing problems -chest pain -confusion -dark urine -fever, infection, runny nose, or sore throat -nausea, vomiting -pain, tingling, numbness in the hands or feet -red spots on the skin -swelling of your hands, ankles or leg -trouble passing urine or change in the amount of urine -unusual bleeding or bruising  Side effects that usually do not require medical attention (Report these to your doctor or health care professional if they continue or are bothersome.): -constipation -diarrhea -dizziness -headache -joint pain -muscle pain -tiredness -trouble sleeping This list may not describe all possible side effects. Call your doctor for medical advice about side effects. You may report side effects to FDA at 1-800-FDA-1088. Where should I keep my medicine? Keep out of the reach of children. Store between 20 and 25 degrees C (68 and 77 degrees F). Throw away any unused medicine after the expiration date. NOTE: This sheet is a summary. It may not cover all  possible information. If you have questions about this medicine, talk to your doctor, pharmacist, or health care provider.  2014, [employer]/Gold Standard. (2011-08-14 05:58:16) Zoledronic Acid injection (Hypercalcemia, Oncology) What is this medicine? ZOLEDRONIC ACID (ZOE le dron ik AS id) lowers the amount of calcium loss from bone. It is used to treat too much calcium in your blood from cancer. It is also used to prevent complications of cancer that has spread to the bone. This medicine may be used for other purposes; ask your health care provider or pharmacist if you have questions. COMMON BRAND NAME(S): Zometa What should I tell my health care provider before I take this medicine? They need to know if you have any of these conditions: -aspirin-sensitive asthma -cancer, especially if you are receiving medicines used to treat cancer -dental disease or wear dentures -infection -kidney disease -receiving corticosteroids like dexamethasone or prednisone -an unusual or allergic reaction to zoledronic acid, other medicines, foods, dyes, or preservatives -pregnant or trying to get pregnant -breast-feeding How should I use this medicine? This medicine is for infusion into a vein. It is given by a health care professional in a hospital or clinic setting. Talk to your pediatrician regarding the use of this medicine in children. Special care may be needed. Overdosage: If you think you have taken too much of this medicine contact a poison control center or  emergency room at once. NOTE: This medicine is only for you. Do not share this medicine with others. What if I miss a dose? It is important not to miss your dose. Call your doctor or health care professional if you are unable to keep an appointment. What may interact with this medicine? -certain antibiotics given by injection -NSAIDs, medicines for pain and inflammation, like ibuprofen or naproxen -some diuretics like bumetanide,  furosemide -teriparatide -thalidomide This list may not describe all possible interactions. Give your health care provider a list of all the medicines, herbs, non-prescription drugs, or dietary supplements you use. Also tell them if you smoke, drink alcohol, or use illegal drugs. Some items may interact with your medicine. What should I watch for while using this medicine? Visit your doctor or health care professional for regular checkups. It may be some time before you see the benefit from this medicine. Do not stop taking your medicine unless your doctor tells you to. Your doctor may order blood tests or other tests to see how you are doing. Women should inform their doctor if they wish to become pregnant or think they might be pregnant. There is a potential for serious side effects to an unborn child. Talk to your health care professional or pharmacist for more information. You should make sure that you get enough calcium and vitamin D while you are taking this medicine. Discuss the foods you eat and the vitamins you take with your health care professional. Some people who take this medicine have severe bone, joint, and/or muscle pain. This medicine may also increase your risk for jaw problems or a broken thigh bone. Tell your doctor right away if you have severe pain in your jaw, bones, joints, or muscles. Tell your doctor if you have any pain that does not go away or that gets worse. Tell your dentist and dental surgeon that you are taking this medicine. You should not have major dental surgery while on this medicine. See your dentist to have a dental exam and fix any dental problems before starting this medicine. Take good care of your teeth while on this medicine. Make sure you see your dentist for regular follow-up appointments. What side effects may I notice from receiving this medicine? Side effects that you should report to your doctor or health care professional as soon as possible: -allergic  reactions like skin rash, itching or hives, swelling of the face, lips, or tongue -anxiety, confusion, or depression -breathing problems -changes in vision -eye pain -feeling faint or lightheaded, falls -jaw pain, especially after dental work -mouth sores -muscle cramps, stiffness, or weakness -trouble passing urine or change in the amount of urine Side effects that usually do not require medical attention (report to your doctor or health care professional if they continue or are bothersome): -bone, joint, or muscle pain -constipation -diarrhea -fever -hair loss -irritation at site where injected -loss of appetite -nausea, vomiting -stomach upset -trouble sleeping -trouble swallowing -weak or tired This list may not describe all possible side effects. Call your doctor for medical advice about side effects. You may report side effects to FDA at 1-800-FDA-1088. Where should I keep my medicine? This drug is given in a hospital or clinic and will not be stored at home. NOTE: This sheet is a summary. It may not cover all possible information. If you have questions about this medicine, talk to your doctor, pharmacist, or health care provider.  2014, [employer] Standard. (2012-10-07 13:03:13)

## 2013-05-16 NOTE — Telephone Encounter (Signed)
[facility] and [facility] ppt sched and avs for pt for Jan and Feb. °

## 2013-05-17 NOTE — Progress Notes (Signed)
[place] OFFICE PROGRESS NOTE  Lindsey Dalton, [place] [address]  DIAGNOSIS: Multiple myeloma - Plan: SPEP & IFE with QIG, Kappa/lambda light chains, CBC with Differential, CBC with Differential in 1 month, Comprehensive metabolic panel, Ambulatory referral to Nutrition and Diabetic Education  Acute pulmonary embolism - Plan: SPEP & IFE with QIG, Kappa/lambda light chains, CBC with Differential, CBC with Differential in 1 month, Comprehensive metabolic panel  Chief Complaint  Patient presents with  . Multiple Myeloma   PAST THERAPY: -Zometa 3 mg about every 3 months from 09/15/2008 through 11/01/2010. 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  Revlimid 10 mg daily 3 weeks out of every 4 weeks. Revlimid was started in June 2010 but discontinued due to development of PE.  Velcade 1.3 mg per meter squared sQ, decadron 8 mg weekly (Vd) was discontinued on 04/04/2013 due to progression.   CURRENT THERAPY:  Pomalyst 2 mg daily for 21 days on and then 7 days off and decadron 8 mg PO weekly started 04/23/2013.   INTERVAL HISTORY: Lindsey Dalton 80 y.o. female with a history of multiple co-morbidities as noted below, including kappa light chain multiple myeloma is here for follow-up.  She was last seen by me on  on 04/11/13. She denies bone pain or fevers/chills or acute shortness of breath. She gives herself Lovenox injections in the abdomen alternating with her anterior lateral thigh region for these injections, allowing the abdomen to heal. She states that the bruising is significantly better on her abdomen although she is experiencing some bruising on her thighs it is not to the extent that she was having on her abdomen. Her complaints of fatigue have remained stable with no increasing fatigue.  She denied nausea, vomiting or constipation.  She endorses mild diarrhea since starting  pomalyst 2 mg.   She is starting her off week for pomalyst. She reports continued weight lost despite ensure diet supplementation.   MEDICAL HISTORY: Past Medical History  Diagnosis Date  . CHF (congestive heart failure)   . Diverticulosis   . Lower GI bleed   . Hiatal hernia   . Hypercalcemia   . Osteoporosis with fracture   . CAD (coronary artery disease)   . HTN (hypertension)   . DJD (degenerative joint disease)   . Dyslipidemia   . Multiple myeloma 04/25/2011  . Vitamin D deficiency 04/25/2011  . Anemia     INTERIM HISTORY: has Multiple myeloma; Vitamin d deficiency; Diarrhea; Loss of weight; Dyspnea; and Acute pulmonary embolism on her problem list.    ALLERGIES:  has No Known Allergies.  MEDICATIONS: has a current medication list which includes the following prescription(s): acyclovir, allopurinol, bismuth subsalicylate, calcium citrate-vitamin d, cholecalciferol, citalopram, dexamethasone, diphenoxylate-atropine, enoxaparin, ferrous gluconate, furosemide, guaifenesin, hydrocodone-acetaminophen, loperamide hcl, methocarbamol, metoprolol succinate, nystatin, ondansetron, pomalidomide, potassium chloride, promethazine, promethazine, ranitidine, simvastatin, and temazepam.  SURGICAL HISTORY:  Past Surgical History  Procedure Laterality Date  . Fixation kyphoplasty      T11, T12, L1  . Total abdominal hysterectomy    . Esophagogastroduodenoscopy  03/01/2012    Procedure: ESOPHAGOGASTRODUODENOSCOPY (EGD);  Surgeon: Lafayette Dragon, MD;  Location: [facility];  Service: Endoscopy;  Laterality: N/A;  . Flexible sigmoidoscopy  03/01/2012    Procedure: FLEXIBLE SIGMOIDOSCOPY;  Surgeon: Lafayette Dragon, MD;  Location: [facility];  Service: Endoscopy;  Laterality: N/A;   PROBLEM LIST:  1. Kappa light chain multiple myeloma diagnosed in April 2010  with 51% plasma cells in the marrow. Kappa light chains evident on serum and urine immunofixation electrophoresis and 8.3 gm of protein in  the urine. FISH studies showed 13q minus and 11:14 translocation indicative of a poor prognosis. The patient initially was treated with Velcade and Decadron from mid-May 2010 through mid-May 2011 with an excellent response. She also received Zometa from 09/15/08 to 11/01/10. The patient also has been receiving Revlimid since June 2010 and has continued on Revlimid and Decadron since that time in a program as described above. She has had an excellent response to treatment. Last metastatic bone survey was on 12/24/2011 and showed no significant changes when compared with the prior study of 07/20/2011. There were a few lytic lesions, but no evidence for progression. The patient's last 24-hour urine collection was on 12/24/2011 and revealed 58 mg of protein. The UIFE was negative for monoclonal light chains. We have not repeated the bone marrow since the initial bone marrow of April 2010 which showed 51% plasma cells.  2. History of congestive heart failure with coronary artery disease status post cardiac catheterizations in 2009.  3. History of diverticulosis and lower GI bleed in May 2010.  4. History of multiple compression fractures with balloon kyphoplasty involving T11, T12, L1, on 08/17/2008.  5. Hypertension.  6. Dyslipidemia.  7. Hiatal hernia.  8. Severe degenerative joint disease.  9. Vitamin D deficiency.  10. 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  11. Chronic diarrhea occurring 5-6 times a day, 3-4 days a week, associated with some incontinence. The patient states that diarrhea has been ongoing since the time of her myeloma diagnosis which actually dates back 3 years. Apparently, the symptom is getting worse.  12. Right-sided Port-A-Cath placed on 04/09/2009.   REVIEW OF SYSTEMS:   Constitutional: Denies fevers, chills or abnormal weight loss Eyes: Denies blurriness of vision Ears, nose, mouth, throat, and face: Denies mucositis or sore  throat Respiratory: Denies cough, dyspnea or wheezes Cardiovascular: Denies palpitation, chest discomfort or lower extremity swelling Gastrointestinal:  Denies nausea, heartburn or change in bowel habits Skin: Denies abnormal skin rashes Lymphatics: Denies new lymphadenopathy or easy bruising Neurological:Denies numbness, tingling or new weaknesses Behavioral/Psych: Mood is stable, no new changes  All other systems were reviewed with the patient and are negative.  PHYSICAL EXAMINATION: ECOG PERFORMANCE STATUS: 0 - Asymptomatic  Blood pressure 150/92, pulse 101, temperature 96.9 F (36.1 C), temperature source Oral, resp. rate 18, height 5' 4" (1.626 m), weight 139 lb 11.2 oz (63.368 kg), SpO2 96.00%.  GENERAL:alert, no distress and comfortable; mildly overweight.   SKIN: skin color, texture, turgor are normal, no rashes or significant lesions EYES: normal, Conjunctiva are pink and non-injected, sclera clear OROPHARYNX:no exudate, no erythema and lips, buccal mucosa, and tongue normal  NECK: supple, thyroid normal size, non-tender, without nodularity LYMPH:  no palpable lymphadenopathy in the cervical, axillary or supraclavicular LUNGS: clear to auscultation and percussion with normal breathing effort HEART: Tachycardia and no murmurs and traceextremity edema ABDOMEN:abdomen soft, non-tender and normal bowel sounds; Right and left  lower abdomen reveals ecchymosis of varying coloration with one firm nodule  Musculoskeletal:no cyanosis of digits and no clubbing ; + LE tenderness with trace edema.  RLE with mild erythrema  Covered by a bandage. NEURO: alert & oriented x 3 with fluent speech, no focal motor/sensory deficits  LABORATORY DATA: Results for orders placed in visit on 05/16/13 (from the past 48 hour(s))  CBC WITH DIFFERENTIAL  Status: Abnormal   Collection Time    05/16/13 12:55 PM      Result Value Range   WBC 5.9  3.9 - 10.3 10e3/uL   NEUT# 2.4  1.5 - 6.5 10e3/uL   HGB  13.5  11.6 - 15.9 g/dL   HCT 41.4  34.8 - 46.6 %   Platelets 271  145 - 400 10e3/uL   MCV 102.0 (*) 79.5 - 101.0 fL   MCH 33.3  25.1 - 34.0 pg   MCHC 32.6  31.5 - 36.0 g/dL   RBC 4.06  3.70 - 5.45 10e6/uL   RDW 14.1  11.2 - 14.5 %   lymph# 2.1  0.9 - 3.3 10e3/uL   MONO# 1.2 (*) 0.1 - 0.9 10e3/uL   Eosinophils Absolute 0.1  0.0 - 0.5 10e3/uL   Basophils Absolute 0.1  0.0 - 0.1 10e3/uL   NEUT% 41.0  38.4 - 76.8 %   LYMPH% 35.7  14.0 - 49.7 %   MONO% 19.4 (*) 0.0 - 14.0 %   EOS% 2.4  0.0 - 7.0 %   BASO% 1.5  0.0 - 2.0 %   nRBC 0  0 - 0 %    Labs:  Lab Results  Component Value Date   WBC 5.9 05/16/2013   HGB 13.5 05/16/2013   HCT 41.4 05/16/2013   MCV 102.0* 05/16/2013   PLT 271 05/16/2013   NEUTROABS 2.4 05/16/2013      Chemistry      Component Value Date/Time   NA 145 05/09/2013 1455   NA 139 10/14/2012 1501   K 3.8 05/09/2013 1455   K 2.8* 10/14/2012 1501   CL 103 10/14/2012 1501   CL 108* 06/21/2012 1317   CO2 29 05/09/2013 1455   CO2 26 10/14/2012 1501   BUN 8.2 05/09/2013 1455   BUN 10 10/14/2012 1501   CREATININE 0.6 05/09/2013 1455   CREATININE 1.00 10/14/2012 1501   CREATININE 0.75 08/15/2009 1030      Component Value Date/Time   CALCIUM 9.7 05/09/2013 1455   CALCIUM 9.1 10/14/2012 1501   CALCIUM 10.9* 08/20/2008 0415   ALKPHOS 55 05/09/2013 1455   ALKPHOS 64 10/14/2012 1501   AST 24 05/09/2013 1455   AST 20 10/14/2012 1501   ALT 20 05/09/2013 1455   ALT 18 10/14/2012 1501   BILITOT 0.32 05/09/2013 1455   BILITOT 0.4 10/14/2012 1501     Basic Metabolic Panel: No results found for this basename: NA, K, CL, CO2, GLUCOSE, BUN, CREATININE, CALCIUM, MG, PHOS,  in the last 168 hours GFR Estimated Creatinine Clearance: 42.8 ml/min (by C-G formula based on Cr of 0.6). Liver Function Tests: No results found for this basename: AST, ALT, ALKPHOS, BILITOT, PROT, ALBUMIN,  in the last 168 hours CBC:  Recent Labs Lab 05/16/13 1255  WBC 5.9  NEUTROABS 2.4  HGB 13.5  HCT 41.4  MCV 102.0*   PLT 271   Results for NIOKA, THORINGTON (MRN [id number]) as of 02/23/2013 19:48  Ref. Range 10/14/2012 14:04 12/13/2012 14:23 02/22/2013 14:55  Kappa:Lambda Ratio Latest Range: 0.26-1.65  148.00 (H) 62.61 (H) 49.76 (H)   Results for LASHANTE, FRYBERGER (MRN [id number]) as of 02/23/2013 19:48  Ref. Range 10/14/2012 14:04 12/13/2012 14:23 02/22/2013 14:55  Kappa free light chain Latest Range: 0.33-1.94 mg/dL 29.60 (H) 43.20 (H) 40.80 (H)   Results for ANDERSON, COPPOCK (MRN [id number]) as of 03/16/2013 04:44  Ref. Range 10/19/2012 13:32 12/16/2012 14:27 03/07/2013 15:11  Time-UPE24 No range found 24  24 24  Volume, Urine-UPE24 No range found 1400 1600 950  Total Protein, Urine-UPE24 No range found 26.3 35.6 67.1  Total Protein, Urine-Ur/day Latest Range: 10-140 mg/day 368 (H) 570 (H) 637 (H)  ALBUMIN, U Latest Range: DETECTED  DETECTED DETECTED DETECTED  Alpha 1, Urine Latest Range: NONE DET  DETECTED (A) DETECTED (A) DETECTED (A)  Alpha 2, Urine Latest Range: NONE DET  DETECTED (A) DETECTED (A) DETECTED (A)  Beta, Urine Latest Range: NONE DET  DETECTED (A) DETECTED (A) DETECTED (A)  Gamma Globulin, Urine Latest Range: NONE DET  DETECTED (A) DETECTED (A) DETECTED (A)  Free Kappa Lt Chains,Ur Latest Range: 0.14-2.42 mg/dL 25.70 (H) 35.10 (H) 66.60 (H)  Free Lt Chn Excr Rate No range found 359.80 561.60 632.70  Free Lambda Lt Chains,Ur Latest Range: 0.02-0.67 mg/dL 0.06 0.03 0.04  Free Lambda Excretion/Day No range found 0.84 0.48 0.38  Free Kappa/Lambda Ratio Latest Range: 2.04-10.37 ratio 428.33 (H) 1170.00 (H) 1665.00 (H)    RADIOGRAPHIC STUDIES: 1. Metastatic bone survey carried out on 07/20/2009 showed osteopenia. There were some small lucent lesions within the skull consistent with a either venous lakes or multiple myeloma deposits. There were numerous wedge compression fractures and degenerative changes throughout the spine. There was a wedge compression of L3 of indeterminate age.  2. Chest  x-ray, 2-view, from 07/25/2010 showed no active cardiopulmonary disease.  3. Metastatic bone survey from 09/05/2010 showed unchanged osseous survey with a few lytic lesions that were seen in the skull, possibly the right femur at the junction of the middle and distal thirds. There were compression fractures of the thoracic and lumbar spine unchanged. The patient is status post vertebroplasty at T11, T12, and L1. A small lytic lesion was seen in the scapula. There was a degenerative cyst in the greater tuberosity of the left humerus.  4. Metastatic bone survey on 07/28/2011 showed minimal progression of small lytic lesions in the skull compatible with a history of multiple myeloma. There was no significant change in the small lytic lesions in left scapula. There was probably better visualization of small lytic lesions in the proximal left femur and poor visualization of a small lytic lesion in the distal right femur. There was stable diffuse osteopenia and multiple vertebral compression fractures.  5. Metastatic bone survey from 12/24/2011 showed multiple compression fractures in the spine which are stable. There were tiny lytic lesions seen in the humeri and in the left clavicle as well as the left scapula. These were felt to be stable. No impending  pathologic fractures.  6. Chest x-ray, 2 view, from 12/24/2011 showed no acute disease.  7. Bone density scan from 10/13/2012 showed a T-score for the lumbar spine total of -0.5. T-score of the right femoral neck was -2.8 and the T- score for the left femoral neck was -3.2. 8. Metastatic bone survey from 03/01/2013 showed stable myeloma lesions observed in the skull, left clavicle, left scapula, and right humerus. No new lesion identified and multiple old compression fractures in the spine, with multilevel vertebral augmentation.  ASSESSMENT: Lindsey Dalton 80 y.o. female with a history of Multiple myeloma - Plan: SPEP & IFE with QIG, Kappa/lambda light  chains, CBC with Differential, CBC with Differential in 1 month, Comprehensive metabolic panel, Ambulatory referral to Nutrition and Diabetic Education  Acute pulmonary embolism - Plan: SPEP & IFE with QIG, Kappa/lambda light chains, CBC with Differential, CBC with Differential in 1 month, Comprehensive metabolic panel  PLAN:  1. Kappa light chain multiple  myeloma. --Her disease was complicated by a history of multiple compression fractures with balloon kyphoplasty involving T11, T12, L1, on 08/17/2008. She is 13q minus and 11:14 translocation indicative of a poor prognosis. Durie-Salmon Stage III based on advanced lesions.   --We will obtain kappa/lambda light chains, CBC, CMP and urine protein studies.  Her creatinine and hemogloblin is stable which is reassuring.  In addition, her kappa/lambda ratio has decreased to 49.76 down from 62.61 and 148 in June of this year.  Her free kappa/lambda urine ratio had increase from June to August from 428.33 to 1170 respectively.  Her skeletal survey is stable.  Her urine studies are concerning for progression.   --We started pomalyst in treatment of multiple myeloma given she received at least 2 prior therapies (including lenalidomide and bortezomib) and have demonstrated disease progression on or within 60 days of completion of the last therapy. We stopped Velcade and change to pomalyst 2 mg daily for 21 days, then off for 7 days in combination with her dexamethasone. She will continue on anticoagulation to reduce her risk of clots.   We will consider switching back to coumadin given her bruising for lovenox. The benefit of treatment including controlling her multiple myeloma and risks were discussed including but not limited to bone marrow suppression resulting in life threatening infections, anemia and fatigue, diarrhea, second malignancies and neuropathy.    2. Pulmonary Embolism (10/21/2012). --She was on revlimid at this time.  It has since been  discontinued. PE is found within the right middle lobar pulmonary artery extending the segmental middle lobar branches, and a few scattered subsegmental arteries in the bilateral lower lobes.   --Continue lovenox 100 mg [place] daily.  Planning to treat for 6 months from day of her diagnosis.  May consider transitioning back to coumadin for her convience.  If so, will need a referral to A/C clinic.  She stated xarelto was not affordable for her.   --Will start pomalyst as noted above and can also cause clots.  We will reduce this by continuing her treatment with Lovenox or warfarin.   3. Subcutaneous bruising due to injections. --Patient reports continued intermittent bruising due to her lovenox injections.   We will monitor closely for worsening.   4. Chronic diarrhea with hypokalemia.  --this is likely secondary to her treatment for #1. Continue immodium as needed. On Potassium chloride 10 MEQ bid.  5. CHF with CAD, Tachycardia. -- On lasix 40 mg daily.   Monitor creatinine and electrolytes. --Patient counseled to call MD with any symptoms of chest pain.   Tachycardia is commonly of symptom of PE.  We will monitor closely if persists or worsens, will check EKG and refer to cardiology.   6. Chronic back pain secondary to #1.  --Hydrocodone-acetaminophen 10-325 mg q 8 hours (prescription provided today #90).  7. Follow-up  --Patient instructed to follow up in 2 weeks with CBCs weekly for the first 8 weeks on Pomalyst.   All questions were answered. The patient knows to call the clinic with any problems, questions or concerns. We can certainly see the patient much sooner if necessary.  I spent 15 minutes counseling the patient face to face. The total time spent in the appointment was 25 minutes.    Raziel Koenigs, MD 05/17/2013 6:01 AM

## 2013-05-20 ENCOUNTER — Telehealth: Payer: Self-pay | Admitting: Internal Medicine

## 2013-05-20 NOTE — Telephone Encounter (Signed)
called pt home and [phone] and s.w. pt and confirmed pt appts.

## 2013-05-30 ENCOUNTER — Other Ambulatory Visit (HOSPITAL_BASED_OUTPATIENT_CLINIC_OR_DEPARTMENT_OTHER): Payer: Medicare Other

## 2013-05-30 ENCOUNTER — Ambulatory Visit: Payer: Medicare Other | Admitting: Nutrition

## 2013-05-30 DIAGNOSIS — C9000 Multiple myeloma not having achieved remission: Secondary | ICD-10-CM

## 2013-05-30 DIAGNOSIS — I2699 Other pulmonary embolism without acute cor pulmonale: Secondary | ICD-10-CM

## 2013-05-30 LAB — CBC WITH DIFFERENTIAL/PLATELET
BASO%: 0.8 % (ref 0.0–2.0)
Basophils Absolute: 0 10*3/uL (ref 0.0–0.1)
EOS%: 7.1 % — ABNORMAL HIGH (ref 0.0–7.0)
Eosinophils Absolute: 0.4 10*3/uL (ref 0.0–0.5)
HCT: 38.3 % (ref 34.8–46.6)
HGB: 12.6 g/dL (ref 11.6–15.9)
LYMPH#: 1.1 10*3/uL (ref 0.9–3.3)
LYMPH%: 17.5 % (ref 14.0–49.7)
MCH: 33.5 pg (ref 25.1–34.0)
MCHC: 32.9 g/dL (ref 31.5–36.0)
MCV: 102 fL — ABNORMAL HIGH (ref 79.5–101.0)
MONO#: 1.2 10*3/uL — AB (ref 0.1–0.9)
MONO%: 19.6 % — ABNORMAL HIGH (ref 0.0–14.0)
NEUT#: 3.3 10*3/uL (ref 1.5–6.5)
NEUT%: 55 % (ref 38.4–76.8)
Platelets: 230 10*3/uL (ref 145–400)
RBC: 3.75 10*6/uL (ref 3.70–5.45)
RDW: 14.5 % (ref 11.2–14.5)
WBC: 6 10*3/uL (ref 3.9–10.3)

## 2013-05-30 LAB — COMPREHENSIVE METABOLIC PANEL (CC13)
ALT: 14 U/L (ref 0–55)
ANION GAP: 7 meq/L (ref 3–11)
AST: 14 U/L (ref 5–34)
Albumin: 2.7 g/dL — ABNORMAL LOW (ref 3.5–5.0)
Alkaline Phosphatase: 63 U/L (ref 40–150)
BUN: 6.9 mg/dL — ABNORMAL LOW (ref 7.0–26.0)
CALCIUM: 9.8 mg/dL (ref 8.4–10.4)
CHLORIDE: 103 meq/L (ref 98–109)
CO2: 30 meq/L — AB (ref 22–29)
Creatinine: 0.7 mg/dL (ref 0.6–1.1)
Glucose: 107 mg/dl (ref 70–140)
Potassium: 4 mEq/L (ref 3.5–5.1)
SODIUM: 141 meq/L (ref 136–145)
Total Bilirubin: 0.35 mg/dL (ref 0.20–1.20)
Total Protein: 5.2 g/dL — ABNORMAL LOW (ref 6.4–8.3)

## 2013-05-30 NOTE — Progress Notes (Signed)
Patient is an 80 year old female diagnosed with multiple myeloma.  Past medical history includes osteonecrosis of the jaw, osteopenia, PE.  Medications include Citracal, Celexa, Decadron, Lomotil, fergon, Lasix, Imodium, Zofran, K-Dur, Phenergan, Zantac, and Zocor.  Labs were reviewed.  Height: 64 inches. Weight: 139.7 pounds January 5. Usual body weight: 151 pounds September 22. BMI: 23.97.  Patient concerned with recent weight loss.  She has a poor appetite and taste alterations.  She tries to drink one oral nutrition supplement daily.  She has frequent diarrhea, which improves with Imodium.  Nutrition diagnosis: Unintended weight loss related to poor appetite, and inadequate oral intake as evidenced by 7% weight loss over 4 months.  Intervention: Educated patient to consume higher calorie, higher protein snacks throughout the day.  I have reviewed foods that are stool thickening for improvement in diarrhea.  Patient educated to increase oral nutrition supplements to a "plus" version and drink twice a day.  I provided fact sheets and coupons for patient.  All her questions were answered.  My contact information was given.  Teach back method used.  Monitoring, evaluation, goals: Patient will be able to increase oral intake to minimize further weight loss.  Next visit: Patient to contact me with questions or concerns.

## 2013-06-02 LAB — SPEP & IFE WITH QIG
Albumin ELP: 58.8 % (ref 55.8–66.1)
Alpha-1-Globulin: 8.2 % — ABNORMAL HIGH (ref 2.9–4.9)
Alpha-2-Globulin: 16.1 % — ABNORMAL HIGH (ref 7.1–11.8)
Beta 2: 4.2 % (ref 3.2–6.5)
Beta Globulin: 7.4 % — ABNORMAL HIGH (ref 4.7–7.2)
GAMMA GLOBULIN: 5.3 % — AB (ref 11.1–18.8)
IGA: 39 mg/dL — AB (ref 69–380)
IGG (IMMUNOGLOBIN G), SERUM: 249 mg/dL — AB (ref 690–1700)
IGM, SERUM: 10 mg/dL — AB (ref 52–322)
M-SPIKE, %: 0.12 g/dL
Total Protein, Serum Electrophoresis: 4.8 g/dL — ABNORMAL LOW (ref 6.0–8.3)

## 2013-06-02 LAB — KAPPA/LAMBDA LIGHT CHAINS
Kappa free light chain: 71.2 mg/dL — ABNORMAL HIGH (ref 0.33–1.94)
Kappa:Lambda Ratio: 80 — ABNORMAL HIGH (ref 0.26–1.65)
Lambda Free Lght Chn: 0.89 mg/dL (ref 0.57–2.63)

## 2013-06-07 ENCOUNTER — Other Ambulatory Visit: Payer: Self-pay | Admitting: Medical Oncology

## 2013-06-07 DIAGNOSIS — C9000 Multiple myeloma not having achieved remission: Secondary | ICD-10-CM

## 2013-06-07 MED ORDER — HYDROCODONE-ACETAMINOPHEN 10-325 MG PO TABS: 1.0000 | ORAL_TABLET | Freq: Three times a day (TID) | ORAL | Status: DC | PRN

## 2013-06-09 ENCOUNTER — Other Ambulatory Visit: Payer: Self-pay | Admitting: *Deleted

## 2013-06-09 NOTE — Telephone Encounter (Signed)
THIS REFILL REQUEST FOR POMALYST WAS GIVEN TO DR.CHISM'S NURSE, KATHY BUYCK,RN. 

## 2013-06-10 ENCOUNTER — Other Ambulatory Visit: Payer: Self-pay | Admitting: *Deleted

## 2013-06-10 ENCOUNTER — Other Ambulatory Visit: Payer: Self-pay | Admitting: Medical Oncology

## 2013-06-10 ENCOUNTER — Telehealth: Payer: Self-pay | Admitting: Internal Medicine

## 2013-06-10 DIAGNOSIS — C9000 Multiple myeloma not having achieved remission: Secondary | ICD-10-CM

## 2013-06-10 MED ORDER — POMALIDOMIDE 2 MG PO CAPS: 2.0000 mg | ORAL_CAPSULE | Freq: Every day | ORAL | Status: DC

## 2013-06-10 NOTE — Telephone Encounter (Signed)
, °

## 2013-06-13 ENCOUNTER — Ambulatory Visit: Payer: Medicare Other

## 2013-06-13 ENCOUNTER — Other Ambulatory Visit (HOSPITAL_BASED_OUTPATIENT_CLINIC_OR_DEPARTMENT_OTHER): Payer: Medicare Other

## 2013-06-13 ENCOUNTER — Other Ambulatory Visit: Payer: Medicare Other

## 2013-06-13 ENCOUNTER — Telehealth: Payer: Self-pay | Admitting: Internal Medicine

## 2013-06-13 ENCOUNTER — Ambulatory Visit (HOSPITAL_BASED_OUTPATIENT_CLINIC_OR_DEPARTMENT_OTHER): Payer: Medicare Other | Admitting: Internal Medicine

## 2013-06-13 ENCOUNTER — Encounter: Payer: Self-pay | Admitting: Internal Medicine

## 2013-06-13 VITALS — BP 148/85 | HR 96 | Temp 96.9°F | Resp 17 | Ht 64.0 in | Wt 134.9 lb

## 2013-06-13 DIAGNOSIS — I2699 Other pulmonary embolism without acute cor pulmonale: Secondary | ICD-10-CM

## 2013-06-13 DIAGNOSIS — G8929 Other chronic pain: Secondary | ICD-10-CM

## 2013-06-13 DIAGNOSIS — C9000 Multiple myeloma not having achieved remission: Secondary | ICD-10-CM

## 2013-06-13 DIAGNOSIS — I509 Heart failure, unspecified: Secondary | ICD-10-CM

## 2013-06-13 DIAGNOSIS — R000 Tachycardia, unspecified: Secondary | ICD-10-CM

## 2013-06-13 DIAGNOSIS — Z7901 Long term (current) use of anticoagulants: Secondary | ICD-10-CM

## 2013-06-13 LAB — CBC WITH DIFFERENTIAL/PLATELET
BASO%: 1.2 % (ref 0.0–2.0)
BASOS ABS: 0.1 10*3/uL (ref 0.0–0.1)
EOS%: 4 % (ref 0.0–7.0)
Eosinophils Absolute: 0.2 10*3/uL (ref 0.0–0.5)
HEMATOCRIT: 39.5 % (ref 34.8–46.6)
HGB: 12.8 g/dL (ref 11.6–15.9)
LYMPH%: 28.6 % (ref 14.0–49.7)
MCH: 33.2 pg (ref 25.1–34.0)
MCHC: 32.4 g/dL (ref 31.5–36.0)
MCV: 102.3 fL — AB (ref 79.5–101.0)
MONO#: 1.4 10*3/uL — ABNORMAL HIGH (ref 0.1–0.9)
MONO%: 32.1 % — AB (ref 0.0–14.0)
NEUT#: 1.5 10*3/uL (ref 1.5–6.5)
NEUT%: 34.1 % — AB (ref 38.4–76.8)
Platelets: 252 10*3/uL (ref 145–400)
RBC: 3.86 10*6/uL (ref 3.70–5.45)
RDW: 15.6 % — AB (ref 11.2–14.5)
WBC: 4.3 10*3/uL (ref 3.9–10.3)
lymph#: 1.2 10*3/uL (ref 0.9–3.3)

## 2013-06-13 LAB — COMPREHENSIVE METABOLIC PANEL (CC13)
ALK PHOS: 58 U/L (ref 40–150)
ALT: 24 U/L (ref 0–55)
AST: 24 U/L (ref 5–34)
Albumin: 3 g/dL — ABNORMAL LOW (ref 3.5–5.0)
Anion Gap: 6 mEq/L (ref 3–11)
BUN: 8.5 mg/dL (ref 7.0–26.0)
CALCIUM: 9.4 mg/dL (ref 8.4–10.4)
CHLORIDE: 107 meq/L (ref 98–109)
CO2: 30 mEq/L — ABNORMAL HIGH (ref 22–29)
CREATININE: 0.7 mg/dL (ref 0.6–1.1)
Glucose: 102 mg/dl (ref 70–140)
Potassium: 4 mEq/L (ref 3.5–5.1)
Sodium: 143 mEq/L (ref 136–145)
Total Bilirubin: 0.33 mg/dL (ref 0.20–1.20)
Total Protein: 5 g/dL — ABNORMAL LOW (ref 6.4–8.3)

## 2013-06-13 MED ORDER — ENOXAPARIN SODIUM 100 MG/ML ~~LOC~~ SOLN: 100.0000 mg | Freq: Every day | SUBCUTANEOUS | Status: AC

## 2013-06-13 NOTE — Telephone Encounter (Signed)
Gave pt appt for March 2015 lab and MD

## 2013-06-13 NOTE — Progress Notes (Signed)
[name]  [name], MD [address]. [place] [address]  DIAGNOSIS: Multiple myeloma - Plan: enoxaparin (LOVENOX) 100 MG/ML injection, CBC with Differential, Comprehensive metabolic panel (Cmet) - [facility]  Acute pulmonary embolism - Plan: enoxaparin (LOVENOX) 100 MG/ML injection  Chief Complaint  Patient presents with  . Multiple Myeloma   PAST THERAPY: -Zometa 3 mg about every 3 months from 09/15/2008 through 11/01/2010. 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  Revlimid 10 mg daily 3 weeks out of every 4 weeks. Revlimid was started in June 2010 but discontinued due to development of PE.  Velcade 1.3 mg per meter squared sQ, decadron 8 mg weekly (Vd) was discontinued on 04/04/2013 due to progression.   CURRENT THERAPY:  Pomalyst 2 mg daily for 21 days on and then 7 days off and decadron 8 mg PO weekly started 04/23/2013.   INTERVAL HISTORY: Lindsey Dalton 80 y.o. female with a history of multiple co-morbidities as noted below, including kappa light chain multiple myeloma is here for follow-up.  She was last seen by me on  on 05/16/13. She denies bone pain or fevers/chills or acute shortness of breath. She gives herself Lovenox injections in the abdomen alternating with her anterior lateral thigh region for these injections, allowing the abdomen to heal. She complaints of 5 lb weight lost.  She denied nausea, vomiting or constipation.  She endorses mild diarrhea since starting pomalyst 2 mg relieved with immodium.   She reports continued weight lost despite ensure diet supplementation.  Today, she is accompanied by her niece.   MEDICAL HISTORY: Past Medical History  Diagnosis Date  . CHF (congestive heart failure)   . Diverticulosis   . Lower GI bleed   . Hiatal hernia   . Hypercalcemia   . Osteoporosis with fracture   . CAD (coronary artery disease)   . HTN  (hypertension)   . DJD (degenerative joint disease)   . Dyslipidemia   . Multiple myeloma 04/25/2011  . Vitamin D deficiency 04/25/2011  . Anemia     INTERIM HISTORY: has Multiple myeloma; Vitamin d deficiency; Diarrhea; Loss of weight; Dyspnea; and Acute pulmonary embolism on her problem list.    ALLERGIES:  has No Known Allergies.  MEDICATIONS: has a current medication list which includes the following prescription(s): acyclovir, allopurinol, bismuth subsalicylate, calcium citrate-vitamin d, cholecalciferol, citalopram, dexamethasone, diphenoxylate-atropine, enoxaparin, ferrous gluconate, furosemide, guaifenesin, hydrocodone-acetaminophen, loperamide hcl, methocarbamol, metoprolol succinate, nystatin, ondansetron, pomalidomide, potassium chloride, promethazine, promethazine, ranitidine, simvastatin, and temazepam.  SURGICAL HISTORY:  Past Surgical History  Procedure Laterality Date  . Fixation kyphoplasty      T11, T12, L1  . Total abdominal hysterectomy    . Esophagogastroduodenoscopy  03/01/2012    Procedure: ESOPHAGOGASTRODUODENOSCOPY (EGD);  Surgeon: Lafayette Dragon, MD;  Location: [facility];  Service: Endoscopy;  Laterality: N/A;  . Flexible sigmoidoscopy  03/01/2012    Procedure: FLEXIBLE SIGMOIDOSCOPY;  Surgeon: Lafayette Dragon, MD;  Location: [facility];  Service: Endoscopy;  Laterality: N/A;   PROBLEM LIST:  1. Kappa light chain multiple myeloma diagnosed in April 2010 with 51% plasma cells in the marrow. Kappa light chains evident on serum and urine immunofixation electrophoresis and 8.3 gm of protein in the urine. FISH studies showed 13q minus and 11:14 translocation indicative of a poor prognosis. The patient initially was treated with Velcade and Decadron from mid-May 2010 through mid-May 2011 with an excellent response. She also received Zometa from  09/15/08 to 11/01/10. The patient also has been receiving Revlimid since June 2010 and has continued on Revlimid and Decadron  since that time in a program as described above. She has had an excellent response to treatment. Last metastatic bone survey was on 12/24/2011 and showed no significant changes when compared with the prior study of 07/20/2011. There were a few lytic lesions, but no evidence for progression. The patient's last 24-hour urine collection was on 12/24/2011 and revealed 58 mg of protein. The UIFE was negative for monoclonal light chains. We have not repeated the bone marrow since the initial bone marrow of April 2010 which showed 51% plasma cells.  2. History of congestive heart failure with coronary artery disease status post cardiac catheterizations in 2009.  3. History of diverticulosis and lower GI bleed in May 2010.  4. History of multiple compression fractures with balloon kyphoplasty involving T11, T12, L1, on 08/17/2008.  5. Hypertension.  6. Dyslipidemia.  7. Hiatal hernia.  8. Severe degenerative joint disease.  9. Vitamin D deficiency.  10. 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  11. Chronic diarrhea occurring 5-6 times a day, 3-4 days a week, associated with some incontinence. The patient states that diarrhea has been ongoing since the time of her myeloma diagnosis which actually dates back 3 years. Apparently, the symptom is getting worse.  12. Right-sided Port-A-Cath placed on 04/09/2009.   REVIEW OF SYSTEMS:   Constitutional: Denies fevers, chills or abnormal weight loss Eyes: Denies blurriness of vision Ears, nose, mouth, throat, and face: Denies mucositis or sore throat Respiratory: Denies cough, dyspnea or wheezes Cardiovascular: Denies palpitation, chest discomfort or lower extremity swelling Gastrointestinal:  Denies nausea, heartburn or change in bowel habits Skin: Denies abnormal skin rashes Lymphatics: Denies new lymphadenopathy or easy bruising Neurological:Denies numbness, tingling or new weaknesses Behavioral/Psych: Mood is stable, no  new changes  All other systems were reviewed with the patient and are negative.  PHYSICAL EXAMINATION: ECOG PERFORMANCE STATUS: 0 - Asymptomatic  Blood pressure 148/85, pulse 96, temperature 96.9 F (36.1 C), temperature source Oral, resp. rate 17, height 5' 4"  (1.626 m), weight 134 lb 14.4 oz (61.19 kg), SpO2 95.00%.  GENERAL:alert, no distress and comfortable SKIN: skin color, texture, turgor are normal, no rashes or significant lesions EYES: normal, Conjunctiva are pink and non-injected, sclera clear OROPHARYNX:no exudate, no erythema and lips, buccal mucosa, and tongue normal  NECK: supple, thyroid normal size, non-tender, without nodularity LYMPH:  no palpable lymphadenopathy in the cervical, axillary or supraclavicular LUNGS: clear to auscultation and percussion with normal breathing effort HEART: Tachycardia and no murmurs and traceextremity edema ABDOMEN:abdomen soft, non-tender and normal bowel sounds; Right and left  lower abdomen reveals ecchymosis of varying coloration with one firm nodule  Musculoskeletal:no cyanosis of digits and no clubbing ; + LE tenderness with trace edema.  RLE with mild erythrema  Covered by a bandage. NEURO: alert & oriented x 3 with fluent speech, no focal motor/sensory deficits  LABORATORY DATA: Results for orders placed in visit on 06/13/13 (from the past 48 hour(s))  CBC WITH DIFFERENTIAL     Status: Abnormal   Collection Time    06/13/13 12:43 PM      Result Value Range   WBC 4.3  3.9 - 10.3 10e3/uL   NEUT# 1.5  1.5 - 6.5 10e3/uL   HGB 12.8  11.6 - 15.9 g/dL   HCT 39.5  34.8 - 46.6 %   Platelets 252  145 - 400 10e3/uL  MCV 102.3 (*) 79.5 - 101.0 fL   MCH 33.2  25.1 - 34.0 pg   MCHC 32.4  31.5 - 36.0 g/dL   RBC 3.86  3.70 - 5.45 10e6/uL   RDW 15.6 (*) 11.2 - 14.5 %   lymph# 1.2  0.9 - 3.3 10e3/uL   MONO# 1.4 (*) 0.1 - 0.9 10e3/uL   Eosinophils Absolute 0.2  0.0 - 0.5 10e3/uL   Basophils Absolute 0.1  0.0 - 0.1 10e3/uL   NEUT% 34.1 (*)  38.4 - 76.8 %   LYMPH% 28.6  14.0 - 49.7 %   MONO% 32.1 (*) 0.0 - 14.0 %   EOS% 4.0  0.0 - 7.0 %   BASO% 1.2  0.0 - 2.0 %  COMPREHENSIVE METABOLIC PANEL (DU20)     Status: Abnormal   Collection Time    06/13/13 12:43 PM      Result Value Range   Sodium 143  136 - 145 mEq/L   Potassium 4.0  3.5 - 5.1 mEq/L   Chloride 107  98 - 109 mEq/L   CO2 30 (*) 22 - 29 mEq/L   Glucose 102  70 - 140 mg/dl   BUN 8.5  7.0 - 26.0 mg/dL   Creatinine 0.7  0.6 - 1.1 mg/dL   Total Bilirubin 0.33  0.20 - 1.20 mg/dL   Alkaline Phosphatase 58  40 - 150 U/L   AST 24  5 - 34 U/L   ALT 24  0 - 55 U/L   Total Protein 5.0 (*) 6.4 - 8.3 g/dL   Albumin 3.0 (*) 3.5 - 5.0 g/dL   Calcium 9.4  8.4 - 10.4 mg/dL   Anion Gap 6  3 - 11 mEq/L    Labs:  Lab Results  Component Value Date   WBC 4.3 06/13/2013   HGB 12.8 06/13/2013   HCT 39.5 06/13/2013   MCV 102.3* 06/13/2013   PLT 252 06/13/2013   NEUTROABS 1.5 06/13/2013      Chemistry      Component Value Date/Time   NA 143 06/13/2013 1243   NA 139 10/14/2012 1501   K 4.0 06/13/2013 1243   K 2.8* 10/14/2012 1501   CL 103 10/14/2012 1501   CL 108* 06/21/2012 1317   CO2 30* 06/13/2013 1243   CO2 26 10/14/2012 1501   BUN 8.5 06/13/2013 1243   BUN 10 10/14/2012 1501   CREATININE 0.7 06/13/2013 1243   CREATININE 1.00 10/14/2012 1501   CREATININE 0.75 08/15/2009 1030      Component Value Date/Time   CALCIUM 9.4 06/13/2013 1243   CALCIUM 9.1 10/14/2012 1501   CALCIUM 10.9* 08/20/2008 0415   ALKPHOS 58 06/13/2013 1243   ALKPHOS 64 10/14/2012 1501   AST 24 06/13/2013 1243   AST 20 10/14/2012 1501   ALT 24 06/13/2013 1243   ALT 18 10/14/2012 1501   BILITOT 0.33 06/13/2013 1243   BILITOT 0.4 10/14/2012 1501    Results for VANIYA, AUGSPURGER (MRN [id number]) as of 06/13/2013 16:24  Ref. Range 02/22/2013 14:55 03/14/2013 14:13 04/11/2013 10:51 05/30/2013 14:09  Kappa:Lambda Ratio Latest Range: 0.26-1.65  49.76 (H) 136.60 (H) 223.20 (H) 80.00 (H)   Basic Metabolic Panel:  Recent Labs Lab 06/13/13 1243  NA  143  K 4.0  CO2 30*  GLUCOSE 102  BUN 8.5  CREATININE 0.7  CALCIUM 9.4   GFR Estimated Creatinine Clearance: 42.8 ml/min (by C-G formula based on Cr of 0.7). Liver Function Tests:  Recent Labs Lab 06/13/13 1243  AST 24  ALT 24  ALKPHOS 58  BILITOT 0.33  PROT 5.0*  ALBUMIN 3.0*   CBC:  Recent Labs Lab 06/13/13 1243  WBC 4.3  NEUTROABS 1.5  HGB 12.8  HCT 39.5  MCV 102.3*  PLT 252    RADIOGRAPHIC STUDIES: 1. Metastatic bone survey carried out on 07/20/2009 showed osteopenia. There were some small lucent lesions within the skull consistent with a either venous lakes or multiple myeloma deposits. There were numerous wedge compression fractures and degenerative changes throughout the spine. There was a wedge compression of L3 of indeterminate age.  2. Chest x-ray, 2-view, from 07/25/2010 showed no active cardiopulmonary disease.  3. Metastatic bone survey from 09/05/2010 showed unchanged osseous survey with a few lytic lesions that were seen in the skull, possibly the right femur at the junction of the middle and distal thirds. There were compression fractures of the thoracic and lumbar spine unchanged. The patient is status post vertebroplasty at T11, T12, and L1. A small lytic lesion was seen in the scapula. There was a degenerative cyst in the greater tuberosity of the left humerus.  4. Metastatic bone survey on 07/28/2011 showed minimal progression of small lytic lesions in the skull compatible with a history of multiple myeloma. There was no significant change in the small lytic lesions in left scapula. There was probably better visualization of small lytic lesions in the proximal left femur and poor visualization of a small lytic lesion in the distal right femur. There was stable diffuse osteopenia and multiple vertebral compression fractures.  5. Metastatic bone survey from 12/24/2011 showed multiple compression fractures in the spine which are stable. There were tiny lytic  lesions seen in the humeri and in the left clavicle as well as the left scapula. These were felt to be stable. No impending  pathologic fractures.  6. Chest x-ray, 2 view, from 12/24/2011 showed no acute disease.  7. Bone density scan from 10/13/2012 showed a T-score for the lumbar spine total of -0.5. T-score of the right femoral neck was -2.8 and the T- score for the left femoral neck was -3.2. 8. Metastatic bone survey from 03/01/2013 showed stable myeloma lesions observed in the skull, left clavicle, left scapula, and right humerus. No new lesion identified and multiple old compression fractures in the spine, with multilevel vertebral augmentation.  ASSESSMENT: Lindsey Dalton 80 y.o. female with a history of Multiple myeloma - Plan: enoxaparin (LOVENOX) 100 MG/ML injection, CBC with Differential, Comprehensive metabolic panel (Cmet) - [facility]  Acute pulmonary embolism - Plan: enoxaparin (LOVENOX) 100 MG/ML injection  PLAN:  1. Kappa light chain multiple myeloma. --Her disease was complicated by a history of multiple compression fractures with balloon kyphoplasty involving T11, T12, L1, on 08/17/2008. She is 13q minus and 11:14 translocation indicative of a poor prognosis. Durie-Salmon Stage III based on advanced lesions.   --We will obtain kappa/lambda light chains, CBC, CMP and urine protein studies.  Her creatinine and hemogloblin is stable which is reassuring.  In addition, her kappa/lambda ratio has decreased to 80 down from 223.  Her skeletal survey is stable.    --We started pomalyst in treatment of multiple myeloma given she received at least 2 prior therapies (including lenalidomide and bortezomib) and have demonstrated disease progression on or within 60 days of completion of the last therapy. We stopped Velcade and change to pomalyst 2 mg daily for 21 days, then off for 7 days in combination with her dexamethasone. She will continue on anticoagulation to reduce  her risk of clots.  The  benefit of treatment including controlling her multiple myeloma and risks were discussed including but not limited to bone marrow suppression resulting in life threatening infections, anemia and fatigue, diarrhea, second malignancies and neuropathy.    2. Pulmonary Embolism (10/21/2012). --She was on revlimid at this time.  It has since been discontinued. PE is found within the right middle lobar pulmonary artery extending the segmental middle lobar branches, and a few scattered subsegmental arteries in the bilateral lower lobes.   --Continue lovenox 100 mg Jobos daily.  Planning to treat for 6 months from day of her diagnosis.  On pomalyst as noted above and can also cause clots.  We will reduce this by continuing her treatment with Lovenox or warfarin.   3. Subcutaneous bruising due to injections. --Patient reports continued intermittent bruising due to her lovenox injections.   We will monitor closely for worsening.   4. Chronic diarrhea with hypokalemia.  --this is likely secondary to her treatment for #1. Continue immodium as needed. On Potassium chloride 10 MEQ bid.  5. CHF with CAD, Tachycardia. -- On lasix 40 mg daily.   Monitor creatinine and electrolytes. --Patient counseled to call MD with any symptoms of chest pain.   Tachycardia is commonly of symptom of PE.  We will monitor closely if persists or worsens, will check EKG and refer to cardiology.   6. Chronic back pain secondary to #1.  --Hydrocodone-acetaminophen 10-325 mg q 8 hours.  7. Anorexia/ Weight loss. --Continue Remeron 30 mg q hs.   8. Follow-up  --Patient instructed to follow up in 4 weeks for cbc, kappa/lambda, urine studies and chemistries.   All questions were answered. The patient knows to call the clinic with any problems, questions or concerns. We can certainly see the patient much sooner if necessary.  I spent 15 minutes counseling the patient face to face. The total time spent in the appointment was 25  minutes.    Temitayo Covalt, MD 06/13/2013 4:23 PM

## 2013-06-16 ENCOUNTER — Encounter: Payer: Self-pay | Admitting: Internal Medicine

## 2013-06-16 NOTE — Progress Notes (Signed)
Faxed lovenox pa form to [employer]. °

## 2013-06-17 ENCOUNTER — Encounter: Payer: Self-pay | Admitting: Internal Medicine

## 2013-06-17 NOTE — Progress Notes (Signed)
[employer] approved enoxaparin 30/30 from 06/16/13-06/16/14

## 2013-06-20 ENCOUNTER — Telehealth: Payer: Self-pay | Admitting: Medical Oncology

## 2013-06-20 NOTE — Telephone Encounter (Signed)
Theresa-grand daughter called and states she did not get the message about seeing pt this afternoon. She has tried to get her grandmother to go to the  ER but she does not want to go. I told her that Dr. Juliann Mule feels she needs to be seen. She is going to call pt's primary care MD to see if they can see pt. I stressed if not, she should go to the ED. She voiced understanding.

## 2013-06-20 NOTE — Telephone Encounter (Signed)
Pt called and states that when she saw Dr. Juliann Mule at her last appointment she was having pain around her ribcage. Now the pain is in her chest. She is coughing up phlegm and is not sure if she has a fever. She would like to come in and see Dr. Juliann Mule tomorrow. I told her I feel she needs to be seen sooner. I spoke with Dr.Chism and he would like for her to be seen this afternoon with [name] if possible with labs and a chest x-ray. She needs to see if her grand daughter can bring her in. I left a message for [name] to call me as soon as possible. I also encouraged the pt to go to the ER to be seen if she can not come to the office today.

## 2013-06-29 ENCOUNTER — Other Ambulatory Visit: Payer: Self-pay | Admitting: Physician Assistant

## 2013-07-07 ENCOUNTER — Other Ambulatory Visit: Payer: Self-pay | Admitting: *Deleted

## 2013-07-07 DIAGNOSIS — C9000 Multiple myeloma not having achieved remission: Secondary | ICD-10-CM

## 2013-07-07 MED ORDER — POMALIDOMIDE 2 MG PO CAPS: 2.0000 mg | ORAL_CAPSULE | Freq: Every day | ORAL | Status: AC

## 2013-07-11 ENCOUNTER — Ambulatory Visit (HOSPITAL_BASED_OUTPATIENT_CLINIC_OR_DEPARTMENT_OTHER): Payer: Medicare Other | Admitting: Internal Medicine

## 2013-07-11 ENCOUNTER — Telehealth: Payer: Self-pay | Admitting: Internal Medicine

## 2013-07-11 ENCOUNTER — Other Ambulatory Visit (HOSPITAL_BASED_OUTPATIENT_CLINIC_OR_DEPARTMENT_OTHER): Payer: Medicare Other

## 2013-07-11 ENCOUNTER — Other Ambulatory Visit: Payer: Self-pay

## 2013-07-11 VITALS — BP 141/85 | HR 91 | Temp 97.7°F | Resp 17 | Ht 64.0 in | Wt 139.3 lb

## 2013-07-11 DIAGNOSIS — R634 Abnormal weight loss: Secondary | ICD-10-CM

## 2013-07-11 DIAGNOSIS — C9000 Multiple myeloma not having achieved remission: Secondary | ICD-10-CM

## 2013-07-11 DIAGNOSIS — G893 Neoplasm related pain (acute) (chronic): Secondary | ICD-10-CM

## 2013-07-11 DIAGNOSIS — M549 Dorsalgia, unspecified: Secondary | ICD-10-CM

## 2013-07-11 DIAGNOSIS — I2699 Other pulmonary embolism without acute cor pulmonale: Secondary | ICD-10-CM

## 2013-07-11 LAB — CBC WITH DIFFERENTIAL/PLATELET
BASO%: 0.8 % (ref 0.0–2.0)
BASOS ABS: 0 10*3/uL (ref 0.0–0.1)
EOS ABS: 0.2 10*3/uL (ref 0.0–0.5)
EOS%: 4.4 % (ref 0.0–7.0)
HCT: 39.5 % (ref 34.8–46.6)
HEMOGLOBIN: 12.6 g/dL (ref 11.6–15.9)
LYMPH%: 29.9 % (ref 14.0–49.7)
MCH: 32.7 pg (ref 25.1–34.0)
MCHC: 31.9 g/dL (ref 31.5–36.0)
MCV: 102.5 fL — AB (ref 79.5–101.0)
MONO#: 1.4 10*3/uL — ABNORMAL HIGH (ref 0.1–0.9)
MONO%: 27.7 % — AB (ref 0.0–14.0)
NEUT%: 37.2 % — ABNORMAL LOW (ref 38.4–76.8)
NEUTROS ABS: 1.8 10*3/uL (ref 1.5–6.5)
PLATELETS: 190 10*3/uL (ref 145–400)
RBC: 3.85 10*6/uL (ref 3.70–5.45)
RDW: 17.6 % — ABNORMAL HIGH (ref 11.2–14.5)
WBC: 4.9 10*3/uL (ref 3.9–10.3)
lymph#: 1.5 10*3/uL (ref 0.9–3.3)

## 2013-07-11 LAB — COMPREHENSIVE METABOLIC PANEL (CC13)
ALBUMIN: 3.2 g/dL — AB (ref 3.5–5.0)
ALK PHOS: 53 U/L (ref 40–150)
ALT: 14 U/L (ref 0–55)
AST: 14 U/L (ref 5–34)
Anion Gap: 4 mEq/L (ref 3–11)
BILIRUBIN TOTAL: 0.54 mg/dL (ref 0.20–1.20)
BUN: 17 mg/dL (ref 7.0–26.0)
CO2: 27 mEq/L (ref 22–29)
Calcium: 10 mg/dL (ref 8.4–10.4)
Chloride: 112 mEq/L — ABNORMAL HIGH (ref 98–109)
Creatinine: 0.7 mg/dL (ref 0.6–1.1)
GLUCOSE: 100 mg/dL (ref 70–140)
Potassium: 3.8 mEq/L (ref 3.5–5.1)
Sodium: 143 mEq/L (ref 136–145)
Total Protein: 5.4 g/dL — ABNORMAL LOW (ref 6.4–8.3)

## 2013-07-11 MED ORDER — HYDROCODONE-ACETAMINOPHEN 10-325 MG PO TABS
1.0000 | ORAL_TABLET | Freq: Three times a day (TID) | ORAL | Status: AC | PRN
Start: 2013-07-11 — End: ?

## 2013-07-11 MED ORDER — HYDROCODONE-ACETAMINOPHEN 10-325 MG PO TABS: 1.0000 | ORAL_TABLET | Freq: Three times a day (TID) | ORAL | Status: DC | PRN

## 2013-07-12 LAB — KAPPA/LAMBDA LIGHT CHAINS
KAPPA FREE LGHT CHN: 2.39 mg/dL — AB (ref 0.33–1.94)
Kappa:Lambda Ratio: 0.28 (ref 0.26–1.65)
Lambda Free Lght Chn: 8.58 mg/dL — ABNORMAL HIGH (ref 0.57–2.63)

## 2013-07-12 NOTE — Progress Notes (Signed)
[facility] OFFICE PROGRESS NOTE  [name], MD [address] [place] [address]  DIAGNOSIS: Multiple myeloma - Plan: CBC with Differential, Comprehensive metabolic panel (Cmet) - [facility], Lactate dehydrogenase (LDH) - [facility], Kappa/lambda light chains, HYDROcodone-acetaminophen (NORCO) 10-325 MG per tablet  Chief Complaint  Patient presents with  . Multiple Myeloma    CURRENT TREATMENT: Pomalyst 2 mg daily for days 1-21, then off one week.      Multiple myeloma   07/26/2008 - 09/22/2009 Chemotherapy Velcade 1.3 mg per meter squared, decadron 8 mg weekly (Vd) with an excellent response   08/10/2008 Pathology FISH studies showed 13 q minus and 11:14 translocation indicative of a poor prognosis.    08/10/2008 Bone Marrow Biopsy 51% plasma cells in the bone marrow.    08/11/2008 Initial Diagnosis Multiple myeloma   09/15/2008 - 11/01/2010 Chemotherapy Zometa 3 mg about every 3 months.  It was discontinued because th patient developed osteonecrosis of the mandible following dental extractions.    10/12/2008 - 10/21/2012 Chemotherapy Revlimid 10 mg daily 3 weeks out of every 4 weeks started.  It was discontinued due to development of PE.    04/09/2009 Procedure Right sided port-a-cath placed.    01/13/2011 Adverse Reaction Developed osteonecrosis of the mandible. Zometa discontinued.    12/24/2011 Tumor Marker 24 hour urine collection revealed 59 mg of protein.  UIFE was negative for monoclonal light chains.     10/21/2012 Adverse Reaction Developed PE in the right middle lobar pulmonary artery extending in the segmental middle lobar branches, and a few scattered subsegmental arteries in the bilateral lower lobes.    03/04/2013 Imaging Metastatic bone survey showed stable myeloma lesions observed in the skull, left clavicle, left scapula and right humberus.  No new lesion identifed and multiple old compression fractures in the spine, with multilevel vertebral augmentation.    04/04/2013  Progression Disease progression.   04/23/2013 -  Chemotherapy Pomalyst 2 mg daily for 21 days on and then 7 days off and decadron 8 mg PO weekly.      INTERVAL HISTORY: Lindsey Dalton 80 y.o. female with a history of multiple co-morbidities as noted below, including kappa light chain multiple myeloma is here for follow-up. She was last seen by me on on 06/13/13. She denies bone pain or fevers/chills or acute shortness of breath. She gives herself Lovenox injections in the abdomen alternating with her anterior lateral thigh region for these injections, allowing the abdomen to heal. She reports a 5 lb weight gain since being on megace. Today, she is accompanied by her niece. She also reports continued chest discomfort described as positional and related to the way she sleeps.  She denies that it is GERD-like and it is without radiation up the jaw or left arm.  She reports seeing her PCP with an increase in her antiacid medications with minimal relief.    MEDICAL HISTORY: Past Medical History  Diagnosis Date  . CHF (congestive heart failure)   . Diverticulosis   . Lower GI bleed   . Hiatal hernia   . Hypercalcemia   . Osteoporosis with fracture   . CAD (coronary artery disease)   . HTN (hypertension)   . DJD (degenerative joint disease)   . Dyslipidemia   . Multiple myeloma 04/25/2011  . Vitamin D deficiency 04/25/2011  . Anemia    PROBLEM LIST:  1. History of congestive heart failure with coronary artery disease status post cardiac catheterizations in 2009.  2. History of diverticulosis and  lower GI bleed in May 2010.  3. History of multiple compression fractures with balloon kyphoplasty involving T11, T12, L1, on 08/17/2008.  4. Hypertension.  5. Dyslipidemia.  6. Hiatal hernia.  7. Severe degenerative joint disease.  8. Vitamin D deficiency.  9. 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  10. Chronic diarrhea occurring 5-6 times a day, 3-4  days a week, associated with some incontinence. The patient states that diarrhea has been ongoing since the time of her myeloma diagnosis which actually dates back 3 years. Apparently, the symptom is getting worse.  11. Right-sided Port-A-Cath placed on 04/09/2009.   INTERIM HISTORY: has Multiple myeloma; Vitamin d deficiency; Diarrhea; Loss of weight; Dyspnea; and Acute pulmonary embolism on her problem list.    ALLERGIES:  has No Known Allergies.  MEDICATIONS: has a current medication list which includes the following prescription(s): acyclovir, allopurinol, bismuth subsalicylate, calcium citrate-vitamin d, cholecalciferol, citalopram, dexamethasone, diphenoxylate-atropine, enoxaparin, ferrous gluconate, furosemide, guaifenesin, hydrocodone-acetaminophen, loperamide hcl, methocarbamol, metoprolol succinate, nystatin, ondansetron, pomalidomide, potassium chloride, promethazine, promethazine, ranitidine, simvastatin, and temazepam.  SURGICAL HISTORY:  Past Surgical History  Procedure Laterality Date  . Fixation kyphoplasty      T11, T12, L1  . Total abdominal hysterectomy    . Esophagogastroduodenoscopy  03/01/2012    Procedure: ESOPHAGOGASTRODUODENOSCOPY (EGD);  Surgeon: Lafayette Dragon, MD;  Location: [facility];  Service: Endoscopy;  Laterality: N/A;  . Flexible sigmoidoscopy  03/01/2012    Procedure: FLEXIBLE SIGMOIDOSCOPY;  Surgeon: Lafayette Dragon, MD;  Location: [facility];  Service: Endoscopy;  Laterality: N/A;    REVIEW OF SYSTEMS:   Constitutional: Denies fevers, chills or abnormal weight loss Eyes: Denies blurriness of vision Ears, nose, mouth, throat, and face: Denies mucositis or sore throat Respiratory: Denies cough, dyspnea or wheezes Cardiovascular: Denies palpitation, chest discomfort or lower extremity swelling Gastrointestinal:  Denies nausea, heartburn or change in bowel habits Skin: Denies abnormal skin rashes Lymphatics: Denies new lymphadenopathy or easy  bruising Neurological:Denies numbness, tingling or new weaknesses Behavioral/Psych: Mood is stable, no new changes  All other systems were reviewed with the patient and are negative.  PHYSICAL EXAMINATION: ECOG PERFORMANCE STATUS: 1 - Symptomatic but completely ambulatory  Blood pressure 141/85, pulse 91, temperature 97.7 F (36.5 C), temperature source Oral, resp. rate 17, height _0  (1.626 m), weight 139 lb 4.8 oz (63.186 kg), SpO2 99.00%.  GENERAL:alert, no distress and comfortable; chronically ill appearing.  Moves to the bench cautiously.  Kyphotic. SKIN: skin color, texture, turgor are normal, no rashes or significant lesions EYES: normal, Conjunctiva are pink and non-injected, sclera clear OROPHARYNX:no exudate, no erythema and lips, buccal mucosa, and tongue normal  NECK: supple, thyroid normal size, non-tender, without nodularity LYMPH:  no palpable lymphadenopathy in the cervical, axillary or supraclavicular LUNGS: clear to auscultation with normal breathing effort, no wheezes or rhonchi HEART: regular rate & rhythm and no murmurs and no lower extremity edema ABDOMEN:abdomen soft, non-tender and normal bowel sounds Musculoskeletal:no cyanosis of digits and no clubbing  NEURO: alert & oriented x 3 with fluent speech, no focal motor/sensory deficits  Labs:  Lab Results  Component Value Date   WBC 4.9 07/11/2013   HGB 12.6 07/11/2013   HCT 39.5 07/11/2013   MCV 102.5* 07/11/2013   PLT 190 07/11/2013   NEUTROABS 1.8 07/11/2013      Chemistry      Component Value Date/Time   NA 143 07/11/2013 1322   NA 139 10/14/2012 1501   K 3.8 07/11/2013 1322  K 2.8* 10/14/2012 1501   CL 103 10/14/2012 1501   CL 108* 06/21/2012 1317   CO2 27 07/11/2013 1322   CO2 26 10/14/2012 1501   BUN 17.0 07/11/2013 1322   BUN 10 10/14/2012 1501   CREATININE 0.7 07/11/2013 1322   CREATININE 1.00 10/14/2012 1501   CREATININE 0.75 08/15/2009 1030      Component Value Date/Time   CALCIUM 10.0 07/11/2013 1322   CALCIUM 9.1  10/14/2012 1501   CALCIUM 10.9* 08/20/2008 0415   ALKPHOS 53 07/11/2013 1322   ALKPHOS 64 10/14/2012 1501   AST 14 07/11/2013 1322   AST 20 10/14/2012 1501   ALT 14 07/11/2013 1322   ALT 18 10/14/2012 1501   BILITOT 0.54 07/11/2013 1322   BILITOT 0.4 10/14/2012 1501     Results for ELLYANA, CRIGLER (MRN [id number]) as of 07/12/2013 19:25  Ref. Range 02/22/2013 14:55 03/14/2013 14:13 04/11/2013 10:51 05/30/2013 14:09 07/11/2013 13:52  Kappa free light chain Latest Range: 0.33-1.94 mg/dL 40.80 (H) 68.30 (H) 55.80 (H) 71.20 (H) 2.39 (H)  Lambda Free Lght Chn Latest Range: 0.57-2.63 mg/dL 0.82 0.50 (L) 0.25 (L) 0.89 8.58 (H)  Kappa:Lambda Ratio Latest Range: 0.26-1.65  49.76 (H) 136.60 (H) 223.20 (H) 80.00 (H) 4.09    Basic Metabolic Panel:  Recent Labs Lab 07/11/13 1322  NA 143  K 3.8  CO2 27  GLUCOSE 100  BUN 17.0  CREATININE 0.7  CALCIUM 10.0   GFR Estimated Creatinine Clearance: 42 ml/min (by C-G formula based on Cr of 0.7). Liver Function Tests:  Recent Labs Lab 07/11/13 1322  AST 14  ALT 14  ALKPHOS 53  BILITOT 0.54  PROT 5.4*  ALBUMIN 3.2*   CBC:  Recent Labs Lab 07/11/13 1322  WBC 4.9  NEUTROABS 1.8  HGB 12.6  HCT 39.5  MCV 102.5*  PLT 190   Studies:  No results found.   RADIOGRAPHIC STUDIES: No results found.  ASSESSMENT: Lindsey Dalton 80 y.o. female with a history of Multiple myeloma - Plan: CBC with Differential, Comprehensive metabolic panel (Cmet) - [facility], Lactate dehydrogenase (LDH) - [facility], Kappa/lambda light chains, HYDROcodone-acetaminophen (NORCO) 10-325 MG per tablet   PLAN:   1. Kappa light chain multiple myeloma.  --She continues to do well clinically. Durie-Salmon Stage III based on advanced lesions. We will obtain kappa/lambda light chains, CBC, CMP and urine protein studies. Her creatinine and hemogloblin is stable which is reassuring. In addition, her kappa/lambda ratio has decreased to normal down from 80 down from 223. Her skeletal survey is  stable. We started pomalyst in treatment of multiple myeloma given she received at least 2 prior therapies (including lenalidomide and bortezomib) and have demonstrated disease progression on or within 60 days of completion of the last therapy.  She will continue on anticoagulation to reduce her risk of clots.   2. Pulmonary Embolism (10/21/2012).  --She was on revlimid at this time. It has since been discontinued. PE is found within the right middle lobar pulmonary artery extending the segmental middle lobar branches, and a few scattered subsegmental arteries in the bilateral lower lobes.  --Continue lovenox 100 mg [place] daily. Planning to treat for 6 months from day of her diagnosis. On pomalyst as noted above and can also cause clots. We will reduce this by continuing her treatment with Lovenox or warfarin.   3. Chronic back pain secondary to #1 and chest MSK pain.  --Hydrocodone-acetaminophen 10-325 mg q 8 hours.   4. Anorexia/ Weight loss.  --Continue Remeron 30  mg q hs.   5. Follow-up  --Patient instructed to follow up in 4 weeks for cbc, kappa/lambda, urine studies and chemistries.   All questions were answered. The patient knows to call the clinic with any problems, questions or concerns. We can certainly see the patient much sooner if necessary.  I spent 15 minutes counseling the patient face to face. The total time spent in the appointment was 25 minutes.    Makyah Lavigne, MD 07/12/2013 2:29 PM

## 2013-07-18 ENCOUNTER — Encounter: Payer: Self-pay | Admitting: Internal Medicine

## 2013-07-18 NOTE — Progress Notes (Signed)
[id number] being submitted for pmt 322.56   [employer] [id number] [facility] 01/17/13 cpt J6283; called [employer], spoke with Alvester Chou, no ref#, this member was approved for financial assistance with the drug Velcade, J9041 eff until 01/25/2014 and still has $3769.18 available in grants, faxing the claim and the primary eob to [id number], referencing pt TK#[id number]

## 2013-07-20 ENCOUNTER — Telehealth: Payer: Self-pay | Admitting: Internal Medicine

## 2013-07-20 ENCOUNTER — Telehealth: Payer: Self-pay | Admitting: Medical Oncology

## 2013-07-20 NOTE — Telephone Encounter (Signed)
Theresa-grand daughter called to notify us that pt expired this am. [name] and her sister went pt's home this am and they found her in the floor. I expressed my sympathy and and how much I have enjoyed taking care of her grandmother. Dr. Juliann Mule notified.

## 2013-07-20 NOTE — Telephone Encounter (Signed)
I called [name] to share my deepest condolences on the lost of her grandmother.  She was such a joy have helped in her care over these past several months.  We also offered condolences on behalf of our healthcare team.   She expressed gratitude.

## 2013-08-10 DEATH — deceased

## 2013-08-24 ENCOUNTER — Ambulatory Visit: Payer: Medicare Other

## 2013-08-24 ENCOUNTER — Other Ambulatory Visit: Payer: Medicare Other

## 2013-10-11 ENCOUNTER — Encounter: Payer: Self-pay | Admitting: Internal Medicine

## 2013-10-11 NOTE — Progress Notes (Signed)
velcade asst no longer needed with [facility]. I faxed to [facility] to advise.

## 2016-11-11 ENCOUNTER — Other Ambulatory Visit: Payer: Self-pay | Admitting: Nurse Practitioner

## 2017-01-10 ENCOUNTER — Other Ambulatory Visit: Payer: Self-pay | Admitting: Nurse Practitioner
